# Patient Record
Sex: Female | Born: 1935
Health system: Southern US, Community
[De-identification: ages and names within clinical notes are randomized; demographics above are authoritative.]

## PROBLEM LIST (undated history)

## (undated) DIAGNOSIS — H9190 Unspecified hearing loss, unspecified ear: Secondary | ICD-10-CM

## (undated) DIAGNOSIS — E039 Hypothyroidism, unspecified: Secondary | ICD-10-CM

## (undated) DIAGNOSIS — I639 Cerebral infarction, unspecified: Secondary | ICD-10-CM

## (undated) DIAGNOSIS — E079 Disorder of thyroid, unspecified: Secondary | ICD-10-CM

## (undated) DIAGNOSIS — E7800 Pure hypercholesterolemia, unspecified: Secondary | ICD-10-CM

## (undated) DIAGNOSIS — M1990 Unspecified osteoarthritis, unspecified site: Secondary | ICD-10-CM

## (undated) DIAGNOSIS — C801 Malignant (primary) neoplasm, unspecified: Secondary | ICD-10-CM

## (undated) DIAGNOSIS — K219 Gastro-esophageal reflux disease without esophagitis: Secondary | ICD-10-CM

## (undated) DIAGNOSIS — F419 Anxiety disorder, unspecified: Secondary | ICD-10-CM

## (undated) DIAGNOSIS — I10 Essential (primary) hypertension: Secondary | ICD-10-CM

## (undated) DIAGNOSIS — I4891 Unspecified atrial fibrillation: Secondary | ICD-10-CM

## (undated) HISTORY — PX: APPENDECTOMY: SHX54

## (undated) HISTORY — DX: Unspecified atrial fibrillation: I48.91

## (undated) HISTORY — DX: Disorder of thyroid, unspecified: E07.9

## (undated) HISTORY — DX: Essential (primary) hypertension: I10

## (undated) HISTORY — PX: BIOPSY THYROID: PRO38

## (undated) HISTORY — PX: CHOLECYSTECTOMY: SHX55

## (undated) HISTORY — PX: ABDOMINAL HYSTERECTOMY: SHX81

## (undated) HISTORY — PX: TONSILLECTOMY: SUR1361

---

## 1998-07-20 ENCOUNTER — Ambulatory Visit (HOSPITAL_COMMUNITY): Admission: RE | Admit: 1998-07-20 | Discharge: 1998-07-20 | Payer: Self-pay | Admitting: Obstetrics & Gynecology

## 2001-08-08 ENCOUNTER — Ambulatory Visit (HOSPITAL_COMMUNITY): Admission: RE | Admit: 2001-08-08 | Discharge: 2001-08-08 | Payer: Self-pay | Admitting: Internal Medicine

## 2001-08-08 ENCOUNTER — Encounter (INDEPENDENT_AMBULATORY_CARE_PROVIDER_SITE_OTHER): Payer: Self-pay | Admitting: Internal Medicine

## 2001-10-09 ENCOUNTER — Ambulatory Visit (HOSPITAL_COMMUNITY): Admission: RE | Admit: 2001-10-09 | Discharge: 2001-10-09 | Payer: Self-pay | Admitting: General Surgery

## 2001-10-09 ENCOUNTER — Encounter: Payer: Self-pay | Admitting: General Surgery

## 2002-10-22 ENCOUNTER — Ambulatory Visit (HOSPITAL_COMMUNITY): Admission: RE | Admit: 2002-10-22 | Discharge: 2002-10-22 | Payer: Self-pay | Admitting: Pulmonary Disease

## 2003-06-24 ENCOUNTER — Other Ambulatory Visit: Admission: RE | Admit: 2003-06-24 | Discharge: 2003-06-24 | Payer: Self-pay | Admitting: Dermatology

## 2003-10-28 ENCOUNTER — Ambulatory Visit (HOSPITAL_COMMUNITY): Admission: RE | Admit: 2003-10-28 | Discharge: 2003-10-28 | Payer: Self-pay | Admitting: Pulmonary Disease

## 2004-08-01 ENCOUNTER — Ambulatory Visit (HOSPITAL_COMMUNITY): Admission: RE | Admit: 2004-08-01 | Discharge: 2004-08-01 | Payer: Self-pay | Admitting: Preventative Medicine

## 2004-08-01 ENCOUNTER — Encounter: Payer: Self-pay | Admitting: Orthopedic Surgery

## 2004-11-16 ENCOUNTER — Ambulatory Visit (HOSPITAL_COMMUNITY): Admission: RE | Admit: 2004-11-16 | Discharge: 2004-11-16 | Payer: Self-pay | Admitting: Internal Medicine

## 2005-11-17 ENCOUNTER — Ambulatory Visit (HOSPITAL_COMMUNITY): Admission: RE | Admit: 2005-11-17 | Discharge: 2005-11-17 | Payer: Self-pay | Admitting: Internal Medicine

## 2006-11-20 ENCOUNTER — Ambulatory Visit (HOSPITAL_COMMUNITY): Admission: RE | Admit: 2006-11-20 | Discharge: 2006-11-20 | Payer: Self-pay | Admitting: Internal Medicine

## 2007-09-24 ENCOUNTER — Ambulatory Visit (HOSPITAL_COMMUNITY): Admission: RE | Admit: 2007-09-24 | Discharge: 2007-09-24 | Payer: Self-pay | Admitting: Preventative Medicine

## 2007-09-24 ENCOUNTER — Encounter: Payer: Self-pay | Admitting: Orthopedic Surgery

## 2007-10-16 ENCOUNTER — Ambulatory Visit: Payer: Self-pay | Admitting: Orthopedic Surgery

## 2007-10-16 DIAGNOSIS — M25519 Pain in unspecified shoulder: Secondary | ICD-10-CM | POA: Insufficient documentation

## 2007-10-16 DIAGNOSIS — Z8679 Personal history of other diseases of the circulatory system: Secondary | ICD-10-CM | POA: Insufficient documentation

## 2007-10-31 ENCOUNTER — Ambulatory Visit (HOSPITAL_COMMUNITY): Admission: RE | Admit: 2007-10-31 | Discharge: 2007-10-31 | Payer: Self-pay | Admitting: Internal Medicine

## 2007-12-02 ENCOUNTER — Ambulatory Visit (HOSPITAL_COMMUNITY): Admission: RE | Admit: 2007-12-02 | Discharge: 2007-12-02 | Payer: Self-pay | Admitting: Internal Medicine

## 2008-01-24 ENCOUNTER — Ambulatory Visit (HOSPITAL_COMMUNITY): Admission: RE | Admit: 2008-01-24 | Discharge: 2008-01-24 | Payer: Self-pay | Admitting: Internal Medicine

## 2008-01-24 ENCOUNTER — Encounter: Payer: Self-pay | Admitting: Orthopedic Surgery

## 2008-03-11 ENCOUNTER — Ambulatory Visit: Payer: Self-pay | Admitting: Orthopedic Surgery

## 2008-03-11 DIAGNOSIS — M7580 Other shoulder lesions, unspecified shoulder: Secondary | ICD-10-CM

## 2008-05-20 ENCOUNTER — Ambulatory Visit (HOSPITAL_COMMUNITY): Admission: RE | Admit: 2008-05-20 | Discharge: 2008-05-20 | Payer: Self-pay | Admitting: Internal Medicine

## 2008-12-30 ENCOUNTER — Ambulatory Visit (HOSPITAL_COMMUNITY): Admission: RE | Admit: 2008-12-30 | Discharge: 2008-12-30 | Payer: Self-pay | Admitting: Internal Medicine

## 2009-02-06 ENCOUNTER — Ambulatory Visit (HOSPITAL_COMMUNITY): Admission: RE | Admit: 2009-02-06 | Discharge: 2009-02-06 | Payer: Self-pay | Admitting: Internal Medicine

## 2009-02-06 ENCOUNTER — Encounter: Payer: Self-pay | Admitting: Orthopedic Surgery

## 2009-03-15 ENCOUNTER — Ambulatory Visit: Payer: Self-pay | Admitting: Orthopedic Surgery

## 2009-03-15 DIAGNOSIS — M2240 Chondromalacia patellae, unspecified knee: Secondary | ICD-10-CM

## 2009-03-15 DIAGNOSIS — M25569 Pain in unspecified knee: Secondary | ICD-10-CM

## 2009-08-19 ENCOUNTER — Ambulatory Visit: Payer: Self-pay | Admitting: Orthopedic Surgery

## 2009-10-01 ENCOUNTER — Ambulatory Visit (HOSPITAL_COMMUNITY): Admission: RE | Admit: 2009-10-01 | Discharge: 2009-10-01 | Payer: Self-pay | Admitting: Internal Medicine

## 2009-12-20 ENCOUNTER — Ambulatory Visit: Payer: Self-pay | Admitting: Orthopedic Surgery

## 2010-01-07 ENCOUNTER — Ambulatory Visit (HOSPITAL_COMMUNITY): Admission: RE | Admit: 2010-01-07 | Discharge: 2010-01-07 | Payer: Self-pay | Admitting: Internal Medicine

## 2010-01-31 ENCOUNTER — Ambulatory Visit: Payer: Self-pay | Admitting: Orthopedic Surgery

## 2010-12-15 NOTE — Assessment & Plan Note (Signed)
Summary: 3 M RE-CK LT KNEE/RESP TO MED/MEDICARE/BCBS/CAF   Visit Type:  Follow-up  CC:  recheck knee pain.  History of Present Illness: I saw Sheila Briggs in the office today for a followup visit.  She is a 77 years old woman with the complaint of:  Left knee pain, chondromalcia patella.  Has had injections and takes Aleve two times a day, helps.  Has new problem of rt shoulder pain.    Current Medications (verified): 1)  Pravastatin 2)  Levothyroxene 3)  Clidinium-Chlordiazepoxide 2.5-5 Mg  Caps (Clidinium-Chlordiazepoxide) 4)  Multi Vitamin 5)  Premarin 0.3 Mg  Tabs (Estrogens Conjugated) 6)  Micardis 40 Mg  Tabs (Telmisartan) 7)  Citalopram Hydrobromide 20 Mg  Tabs (Citalopram Hydrobromide)  Allergies (verified): No Known Drug Allergies  Past History:  Past Medical History: Last updated: 10/16/2007 High Blood Pressure thyroid  Past Surgical History: Last updated: 10/16/2007 Tonsillectomy Appendectomy Cholecystectomy hysterectomy skin cancer basil cell  Family History: Last updated: 10/16/2007 Family History Coronary Heart Disease female < 74  Social History: Last updated: 10/16/2007 Patient is widowed.   Risk Factors: Caffeine Use: 0 (10/16/2007)  Risk Factors: Smoking Status: never (10/16/2007)  Physical Exam  Additional Exam:  Normal appearance  Normal pulses  Normal skin  Normal neuro function  Oriented x3  Mood and affect normal  Painful forward elevation of the shoulder positive Neer sign mild weakness rotator cuff full passive range of motion slightly decreased active range of motion RIGHT shoulder   Impression & Recommendations:  Problem # 1:  KNEE PAIN (ZOX-096.04) Assessment Improved  Orders: New Patient Level III (54098) Joint Aspirate / Injection, Large (20610) Depo- Medrol 40mg  (J1030)  Problem # 2:  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 There were no complications  Orders: New Patient Level III (78295) Joint Aspirate / Injection, Large (20610) Depo- Medrol 40mg  (J1030)  Patient Instructions: 1)  You have received an injection of cortisone today. You may experience increased pain at the injection site. Apply ice pack to the area for 20 minutes every 2 hours and take 2 xtra strength tylenol every 8 hours. This increased pain will usually resolve in 24 hours. The injection will take effect in 3-10 days.  2)  Limit activity to comfort and avoid activities that increase discomfort.  Apply moist heat and/or ice to shoulder and take medication as instructed for pain relief. Please read the Shoulder Pain Handout and start exercises  as directed. 3)  return in 6 weeks

## 2010-12-15 NOTE — Assessment & Plan Note (Signed)
Summary: 6 WK RECK RT SHOULDER AFTER INJECTION/MED/BCBS/BSF   Visit Type:  Follow-up  CC:  6 week recheck right shoulder.  History of Present Illness: I saw Sheila Briggs in the office today for a followup visit.  She is a 77 years old woman with the complaint of:  DX: Impingement syndrome  Treatment: injection.  MEDS:  None for this problem.  Complaints: doing alot better, left feels good knee and RIGHT shoulder feels great Today, scheduled for: 6 week recheck right shoulder after injection.  She has full elevation without pain.  She has full flexion and extension of the knee without pain.  Impression improved impingement syndrome and improved LEFT knee pain/chondromalacia patella.  Recommend follow up as needed   Allergies: No Known Drug Allergies   Other Orders: Est. Patient Level II (57846)  Patient Instructions: 1)  Please schedule a follow-up appointment as needed.

## 2011-01-12 ENCOUNTER — Other Ambulatory Visit (HOSPITAL_COMMUNITY): Payer: Self-pay | Admitting: Internal Medicine

## 2011-01-12 DIAGNOSIS — Z139 Encounter for screening, unspecified: Secondary | ICD-10-CM

## 2011-01-19 ENCOUNTER — Ambulatory Visit (HOSPITAL_COMMUNITY)
Admission: RE | Admit: 2011-01-19 | Discharge: 2011-01-19 | Disposition: A | Discharge status: Home / Self Care | Payer: Medicare Other | Source: Ambulatory Visit | Attending: Internal Medicine | Admitting: Internal Medicine

## 2011-01-19 DIAGNOSIS — Z139 Encounter for screening, unspecified: Secondary | ICD-10-CM

## 2011-01-19 DIAGNOSIS — Z1231 Encounter for screening mammogram for malignant neoplasm of breast: Secondary | ICD-10-CM | POA: Insufficient documentation

## 2011-03-21 ENCOUNTER — Ambulatory Visit (INDEPENDENT_AMBULATORY_CARE_PROVIDER_SITE_OTHER): Payer: Medicare Other | Admitting: Orthopedic Surgery

## 2011-03-21 ENCOUNTER — Encounter: Payer: Self-pay | Admitting: Orthopedic Surgery

## 2011-03-21 DIAGNOSIS — I10 Essential (primary) hypertension: Secondary | ICD-10-CM | POA: Insufficient documentation

## 2011-03-21 DIAGNOSIS — M4802 Spinal stenosis, cervical region: Secondary | ICD-10-CM

## 2011-03-21 NOTE — Patient Instructions (Signed)
Referral to Surgery [facility] for cervical spinal stenosis   For now continue aleve

## 2011-03-21 NOTE — Progress Notes (Signed)
Neck stiffness and neck pain.  77 year old female, who had an MRI 4 years ago of the cervical spine, which showed a broad-based disc protrusion with mass effect at C3 and 4 multilevel disc disease, down to C6, and a moderate to large brace disc protrusion, flattening the ventral cord at C5, C6, with neural foraminal stenosis bilaterally.  She presents back complaining of neck stiffness and neck pain with occasional radiation into the RIGHT forearm.  Further questioning reveals pain in the cervical spine, which radiates up into the head area, decreased range of motion is noted on clinical exam, however, she has no weakness, although she has degenerative arthritis of the upper extremities, including the hands.  She has no neurologic deficit at this time.  X-rays show multilevel disc disease with deformity on plain film.  Recommend neurosurgical referral for spinal stenosis.  Note on review of systems the patient denied any gait abnormalities or abnormalities or trouble holding on to objects

## 2011-03-27 ENCOUNTER — Telehealth: Payer: Self-pay | Admitting: Radiology

## 2011-03-27 NOTE — Telephone Encounter (Signed)
I faxed a referral for this patient to [facility] for cervical spinal stenosis.

## 2011-03-30 ENCOUNTER — Telehealth: Payer: Self-pay | Admitting: Radiology

## 2011-03-30 NOTE — Telephone Encounter (Signed)
Patient has an appointment with Dr. Danielle Dess at [facility] on 04-13-11 for her spinal stenosis of her c-spine.

## 2011-05-31 ENCOUNTER — Other Ambulatory Visit (HOSPITAL_COMMUNITY): Payer: Self-pay | Admitting: Neurological Surgery

## 2011-05-31 DIAGNOSIS — M545 Low back pain: Secondary | ICD-10-CM

## 2011-05-31 DIAGNOSIS — M542 Cervicalgia: Secondary | ICD-10-CM

## 2011-06-02 ENCOUNTER — Ambulatory Visit (HOSPITAL_COMMUNITY)
Admission: RE | Admit: 2011-06-02 | Discharge: 2011-06-02 | Disposition: A | Discharge status: Home / Self Care | Payer: Medicare Other | Source: Ambulatory Visit | Attending: Neurological Surgery | Admitting: Neurological Surgery

## 2011-06-02 DIAGNOSIS — M545 Low back pain, unspecified: Secondary | ICD-10-CM

## 2011-06-02 DIAGNOSIS — M542 Cervicalgia: Secondary | ICD-10-CM

## 2011-06-02 DIAGNOSIS — R209 Unspecified disturbances of skin sensation: Secondary | ICD-10-CM | POA: Insufficient documentation

## 2011-06-02 DIAGNOSIS — M5030 Other cervical disc degeneration, unspecified cervical region: Secondary | ICD-10-CM | POA: Insufficient documentation

## 2011-06-02 DIAGNOSIS — M79609 Pain in unspecified limb: Secondary | ICD-10-CM | POA: Insufficient documentation

## 2011-06-02 DIAGNOSIS — M5380 Other specified dorsopathies, site unspecified: Secondary | ICD-10-CM | POA: Insufficient documentation

## 2011-06-13 ENCOUNTER — Other Ambulatory Visit (HOSPITAL_COMMUNITY): Payer: Self-pay | Admitting: Internal Medicine

## 2011-06-13 DIAGNOSIS — E041 Nontoxic single thyroid nodule: Secondary | ICD-10-CM

## 2011-06-15 ENCOUNTER — Ambulatory Visit (HOSPITAL_COMMUNITY)
Admission: RE | Admit: 2011-06-15 | Discharge: 2011-06-15 | Disposition: A | Discharge status: Home / Self Care | Payer: Medicare Other | Source: Ambulatory Visit | Attending: Internal Medicine | Admitting: Internal Medicine

## 2011-06-15 DIAGNOSIS — E042 Nontoxic multinodular goiter: Secondary | ICD-10-CM | POA: Insufficient documentation

## 2011-06-15 DIAGNOSIS — E041 Nontoxic single thyroid nodule: Secondary | ICD-10-CM

## 2011-06-16 ENCOUNTER — Other Ambulatory Visit (HOSPITAL_COMMUNITY): Payer: Self-pay | Admitting: Internal Medicine

## 2011-06-16 DIAGNOSIS — E041 Nontoxic single thyroid nodule: Secondary | ICD-10-CM

## 2011-06-16 DIAGNOSIS — E042 Nontoxic multinodular goiter: Secondary | ICD-10-CM

## 2011-06-26 ENCOUNTER — Ambulatory Visit (HOSPITAL_COMMUNITY)
Admission: RE | Admit: 2011-06-26 | Discharge: 2011-06-26 | Disposition: A | Discharge status: Home / Self Care | Payer: Medicare Other | Source: Ambulatory Visit | Attending: Internal Medicine | Admitting: Internal Medicine

## 2011-06-26 ENCOUNTER — Ambulatory Visit (HOSPITAL_COMMUNITY)
Admission: RE | Admit: 2011-06-26 | Discharge: 2011-06-26 | Payer: Medicare Other | Source: Ambulatory Visit | Attending: Internal Medicine | Admitting: Internal Medicine

## 2011-06-26 ENCOUNTER — Other Ambulatory Visit (HOSPITAL_COMMUNITY): Payer: Self-pay | Admitting: Internal Medicine

## 2011-06-26 DIAGNOSIS — E041 Nontoxic single thyroid nodule: Secondary | ICD-10-CM

## 2011-06-26 DIAGNOSIS — E0789 Other specified disorders of thyroid: Secondary | ICD-10-CM | POA: Insufficient documentation

## 2011-06-26 NOTE — Procedures (Signed)
PreOperative Dx: Bilateral thyroid nodules  Postoperative Dx: Bilateral thyroid nodules Procedure:   Bilateral ultrasound guided thyroid nodule FNA Radiologist:  Tyron Russell Anesthesia:  Local 3 ml 2% xylocaine Specimen:  Bilateral thyroid FNA x 3 of each nodule EBL:   Minimal Complications: None Procedure tolerated very well by patient

## 2011-06-26 NOTE — Progress Notes (Signed)
Procedure started at 1048. Injected with 3 cc Lidocaine.  Procedure complete at 1109.  Biopsies obtained from right inferior and left mid.  Tolerated procedure well.

## 2011-07-06 NOTE — Progress Notes (Signed)
Reviewed cytology results of bilateral thyroid nodule FNA.

## 2011-11-15 DIAGNOSIS — L02419 Cutaneous abscess of limb, unspecified: Secondary | ICD-10-CM | POA: Diagnosis not present

## 2011-11-23 DIAGNOSIS — L03119 Cellulitis of unspecified part of limb: Secondary | ICD-10-CM | POA: Diagnosis not present

## 2011-11-27 DIAGNOSIS — S81809A Unspecified open wound, unspecified lower leg, initial encounter: Secondary | ICD-10-CM | POA: Diagnosis not present

## 2011-12-18 DIAGNOSIS — Z85828 Personal history of other malignant neoplasm of skin: Secondary | ICD-10-CM | POA: Diagnosis not present

## 2011-12-18 DIAGNOSIS — L821 Other seborrheic keratosis: Secondary | ICD-10-CM | POA: Diagnosis not present

## 2011-12-18 DIAGNOSIS — L570 Actinic keratosis: Secondary | ICD-10-CM | POA: Diagnosis not present

## 2012-01-01 DIAGNOSIS — H5200 Hypermetropia, unspecified eye: Secondary | ICD-10-CM | POA: Diagnosis not present

## 2012-01-01 DIAGNOSIS — H01009 Unspecified blepharitis unspecified eye, unspecified eyelid: Secondary | ICD-10-CM | POA: Diagnosis not present

## 2012-01-01 DIAGNOSIS — H2589 Other age-related cataract: Secondary | ICD-10-CM | POA: Diagnosis not present

## 2012-01-01 DIAGNOSIS — H04129 Dry eye syndrome of unspecified lacrimal gland: Secondary | ICD-10-CM | POA: Diagnosis not present

## 2012-01-15 ENCOUNTER — Other Ambulatory Visit (HOSPITAL_COMMUNITY): Payer: Self-pay | Admitting: Internal Medicine

## 2012-01-15 DIAGNOSIS — Z139 Encounter for screening, unspecified: Secondary | ICD-10-CM

## 2012-01-23 ENCOUNTER — Ambulatory Visit (HOSPITAL_COMMUNITY)
Admission: RE | Admit: 2012-01-23 | Discharge: 2012-01-23 | Disposition: A | Discharge status: Home / Self Care | Payer: Medicare Other | Source: Ambulatory Visit | Attending: Internal Medicine | Admitting: Internal Medicine

## 2012-01-23 DIAGNOSIS — Z139 Encounter for screening, unspecified: Secondary | ICD-10-CM

## 2012-01-23 DIAGNOSIS — Z1231 Encounter for screening mammogram for malignant neoplasm of breast: Secondary | ICD-10-CM | POA: Diagnosis not present

## 2012-01-23 DIAGNOSIS — R928 Other abnormal and inconclusive findings on diagnostic imaging of breast: Secondary | ICD-10-CM | POA: Diagnosis not present

## 2012-01-29 ENCOUNTER — Other Ambulatory Visit: Payer: Self-pay | Admitting: Internal Medicine

## 2012-01-29 DIAGNOSIS — R928 Other abnormal and inconclusive findings on diagnostic imaging of breast: Secondary | ICD-10-CM

## 2012-02-14 ENCOUNTER — Ambulatory Visit (HOSPITAL_COMMUNITY)
Admission: RE | Admit: 2012-02-14 | Discharge: 2012-02-14 | Disposition: A | Discharge status: Home / Self Care | Payer: Medicare Other | Source: Ambulatory Visit | Attending: Internal Medicine | Admitting: Internal Medicine

## 2012-02-14 ENCOUNTER — Other Ambulatory Visit: Payer: Self-pay | Admitting: Internal Medicine

## 2012-02-14 DIAGNOSIS — R928 Other abnormal and inconclusive findings on diagnostic imaging of breast: Secondary | ICD-10-CM | POA: Diagnosis not present

## 2012-02-15 DIAGNOSIS — I10 Essential (primary) hypertension: Secondary | ICD-10-CM | POA: Diagnosis not present

## 2012-02-15 DIAGNOSIS — Z79890 Hormone replacement therapy (postmenopausal): Secondary | ICD-10-CM | POA: Diagnosis not present

## 2012-05-09 DIAGNOSIS — L570 Actinic keratosis: Secondary | ICD-10-CM | POA: Diagnosis not present

## 2012-05-09 DIAGNOSIS — D235 Other benign neoplasm of skin of trunk: Secondary | ICD-10-CM | POA: Diagnosis not present

## 2012-05-09 DIAGNOSIS — Z85828 Personal history of other malignant neoplasm of skin: Secondary | ICD-10-CM | POA: Diagnosis not present

## 2012-06-08 DIAGNOSIS — E039 Hypothyroidism, unspecified: Secondary | ICD-10-CM | POA: Diagnosis not present

## 2012-06-08 DIAGNOSIS — I10 Essential (primary) hypertension: Secondary | ICD-10-CM | POA: Diagnosis not present

## 2012-06-08 DIAGNOSIS — E785 Hyperlipidemia, unspecified: Secondary | ICD-10-CM | POA: Diagnosis not present

## 2012-06-08 DIAGNOSIS — Z79899 Other long term (current) drug therapy: Secondary | ICD-10-CM | POA: Diagnosis not present

## 2012-06-10 ENCOUNTER — Ambulatory Visit (INDEPENDENT_AMBULATORY_CARE_PROVIDER_SITE_OTHER): Payer: Medicare Other | Admitting: Internal Medicine

## 2012-06-10 ENCOUNTER — Encounter (INDEPENDENT_AMBULATORY_CARE_PROVIDER_SITE_OTHER): Payer: Self-pay | Admitting: Internal Medicine

## 2012-06-10 VITALS — BP 128/80 | HR 80 | Temp 98.0°F | Ht 62.0 in | Wt 121.8 lb

## 2012-06-10 DIAGNOSIS — K219 Gastro-esophageal reflux disease without esophagitis: Secondary | ICD-10-CM | POA: Diagnosis not present

## 2012-06-10 DIAGNOSIS — E039 Hypothyroidism, unspecified: Secondary | ICD-10-CM | POA: Insufficient documentation

## 2012-06-10 NOTE — Patient Instructions (Addendum)
Dexilant samples. HOB up. PR in 2 weeks.

## 2012-06-10 NOTE — Progress Notes (Signed)
Subjective:     Patient ID: Sheila Briggs, female   DOB: 09/25/1932, 78 y.o.   MRN: [id number]  HPINancy is a 77 yr old female with today with c/o that she has indigestion and loud burps. This does not occur every day.  This has been occurring for 8-9 months. She has been on Omeprazole for this and has help some. This occurs every 3-4 days or may last 2-3 days. Appetite is good. No weight loss.  No abdominal pain. She does have a burning sensation after eating. She has trouble at night lying down. She has a BM x 1 a day. No melena or bright red rectal bleeding.   Review of Systems see hpi Current Outpatient Prescriptions  Medication Sig Dispense Refill  . amLODipine (NORVASC) 2.5 MG tablet       . citalopram (CELEXA) 10 MG tablet Take 10 mg by mouth daily.      Marland Kitchen estradiol (ESTRACE) 0.5 MG tablet       . hydrochlorothiazide (HYDRODIURIL) 12.5 MG tablet       . omeprazole (PRILOSEC) 20 MG capsule       . SYNTHROID 25 MCG tablet       . naproxen sodium (ANAPROX) 220 MG tablet Take 220 mg by mouth as needed.         Current Outpatient Prescriptions on File Prior to Visit  Medication Sig Dispense Refill  . amLODipine (NORVASC) 2.5 MG tablet       . citalopram (CELEXA) 10 MG tablet Take 10 mg by mouth daily.      Marland Kitchen estradiol (ESTRACE) 0.5 MG tablet       . hydrochlorothiazide (HYDRODIURIL) 12.5 MG tablet       . omeprazole (PRILOSEC) 20 MG capsule       . SYNTHROID 25 MCG tablet       . naproxen sodium (ANAPROX) 220 MG tablet Take 220 mg by mouth as needed.         Past Medical History  Diagnosis Date  . Thyroid disease   . Hypertension    Past Surgical History  Procedure Date  . Biopsy thyroid     benign   Family Status  Relation Status Death Age  . Mother Deceased     alzheimer's  . Father Deceased     MI  . Brother Deceased     MI   History   Social History  . Marital Status: Widowed    Spouse Name: N/A    Number of Children: N/A  . Years of Education: N/A    Occupational History  . Not on file.   Social History Main Topics  . Smoking status: Never Smoker   . Smokeless tobacco: Not on file  . Alcohol Use: No  . Drug Use:   . Sexually Active:    Other Topics Concern  . Not on file   Social History Narrative  . No narrative on file   No Known Allergies     Objective:   Physical Exam Filed Vitals:   06/10/12 1429  Height: 5\' 2"  (1.575 m)  Weight: 121 lb 12.8 oz (55.248 kg)  Alert and oriented. Skin warm and dry. Oral mucosa is moist.   . Sclera anicteric, conjunctivae is pink. Thyroid not enlarged. No cervical lymphadenopathy. Lungs clear. Heart regular rate and rhythm.  Abdomen is soft. Bowel sounds are positive. No hepatomegaly. No abdominal masses felt. No tenderness.  No edema to lower extremities.  Marland Kitchen  Assessment:    GERD. Not controlled at this time.    Plan:    Dexilant samples today. HOB up. GERD diet reviewed with patient.

## 2012-06-17 ENCOUNTER — Other Ambulatory Visit (HOSPITAL_COMMUNITY): Payer: Self-pay | Admitting: Internal Medicine

## 2012-06-17 DIAGNOSIS — I10 Essential (primary) hypertension: Secondary | ICD-10-CM | POA: Diagnosis not present

## 2012-06-17 DIAGNOSIS — E785 Hyperlipidemia, unspecified: Secondary | ICD-10-CM | POA: Diagnosis not present

## 2012-06-17 DIAGNOSIS — E039 Hypothyroidism, unspecified: Secondary | ICD-10-CM | POA: Diagnosis not present

## 2012-06-17 DIAGNOSIS — R0989 Other specified symptoms and signs involving the circulatory and respiratory systems: Secondary | ICD-10-CM

## 2012-06-17 DIAGNOSIS — I4949 Other premature depolarization: Secondary | ICD-10-CM | POA: Diagnosis not present

## 2012-06-20 ENCOUNTER — Ambulatory Visit (HOSPITAL_COMMUNITY)
Admission: RE | Admit: 2012-06-20 | Discharge: 2012-06-20 | Disposition: A | Discharge status: Home / Self Care | Payer: Medicare Other | Source: Ambulatory Visit | Attending: Internal Medicine | Admitting: Internal Medicine

## 2012-06-20 ENCOUNTER — Other Ambulatory Visit (HOSPITAL_COMMUNITY): Payer: Self-pay | Admitting: Internal Medicine

## 2012-06-20 DIAGNOSIS — I6529 Occlusion and stenosis of unspecified carotid artery: Secondary | ICD-10-CM | POA: Diagnosis not present

## 2012-06-20 DIAGNOSIS — R0989 Other specified symptoms and signs involving the circulatory and respiratory systems: Secondary | ICD-10-CM

## 2012-06-20 DIAGNOSIS — I658 Occlusion and stenosis of other precerebral arteries: Secondary | ICD-10-CM | POA: Diagnosis not present

## 2012-06-24 ENCOUNTER — Telehealth (INDEPENDENT_AMBULATORY_CARE_PROVIDER_SITE_OTHER): Payer: Self-pay | Admitting: Internal Medicine

## 2012-06-25 ENCOUNTER — Telehealth (INDEPENDENT_AMBULATORY_CARE_PROVIDER_SITE_OTHER): Payer: Self-pay | Admitting: Internal Medicine

## 2012-06-25 DIAGNOSIS — K219 Gastro-esophageal reflux disease without esophagitis: Secondary | ICD-10-CM

## 2012-06-25 MED ORDER — OMEPRAZOLE 40 MG PO CPDR: 40.0000 mg | DELAYED_RELEASE_CAPSULE | Freq: Every day | ORAL | Status: DC

## 2012-06-25 NOTE — Telephone Encounter (Signed)
Rx for omperazole e-prescribed to her pharmacy.

## 2012-06-25 NOTE — Telephone Encounter (Signed)
Needs Rx for Omeprazole

## 2012-07-17 ENCOUNTER — Other Ambulatory Visit (HOSPITAL_COMMUNITY): Payer: Self-pay | Admitting: Internal Medicine

## 2012-07-17 DIAGNOSIS — N6320 Unspecified lump in the left breast, unspecified quadrant: Secondary | ICD-10-CM

## 2012-08-16 DIAGNOSIS — Z23 Encounter for immunization: Secondary | ICD-10-CM | POA: Diagnosis not present

## 2012-08-21 ENCOUNTER — Ambulatory Visit (HOSPITAL_COMMUNITY)
Admission: RE | Admit: 2012-08-21 | Discharge: 2012-08-21 | Disposition: A | Discharge status: Home / Self Care | Payer: Medicare Other | Source: Ambulatory Visit | Attending: Internal Medicine | Admitting: Internal Medicine

## 2012-08-21 ENCOUNTER — Other Ambulatory Visit (HOSPITAL_COMMUNITY): Payer: Self-pay | Admitting: Internal Medicine

## 2012-08-21 DIAGNOSIS — N6320 Unspecified lump in the left breast, unspecified quadrant: Secondary | ICD-10-CM

## 2012-08-21 DIAGNOSIS — N63 Unspecified lump in unspecified breast: Secondary | ICD-10-CM | POA: Insufficient documentation

## 2012-08-21 DIAGNOSIS — R928 Other abnormal and inconclusive findings on diagnostic imaging of breast: Secondary | ICD-10-CM | POA: Diagnosis not present

## 2012-09-02 ENCOUNTER — Encounter (INDEPENDENT_AMBULATORY_CARE_PROVIDER_SITE_OTHER): Payer: Self-pay

## 2012-10-02 DIAGNOSIS — H2589 Other age-related cataract: Secondary | ICD-10-CM | POA: Diagnosis not present

## 2012-10-16 DIAGNOSIS — E785 Hyperlipidemia, unspecified: Secondary | ICD-10-CM | POA: Diagnosis not present

## 2012-10-16 DIAGNOSIS — Z79899 Other long term (current) drug therapy: Secondary | ICD-10-CM | POA: Diagnosis not present

## 2012-10-24 DIAGNOSIS — E785 Hyperlipidemia, unspecified: Secondary | ICD-10-CM | POA: Diagnosis not present

## 2012-10-24 DIAGNOSIS — I10 Essential (primary) hypertension: Secondary | ICD-10-CM | POA: Diagnosis not present

## 2012-11-18 DIAGNOSIS — H2589 Other age-related cataract: Secondary | ICD-10-CM | POA: Diagnosis not present

## 2012-11-18 DIAGNOSIS — H40019 Open angle with borderline findings, low risk, unspecified eye: Secondary | ICD-10-CM | POA: Diagnosis not present

## 2012-11-18 DIAGNOSIS — H35039 Hypertensive retinopathy, unspecified eye: Secondary | ICD-10-CM | POA: Diagnosis not present

## 2012-11-18 DIAGNOSIS — H04129 Dry eye syndrome of unspecified lacrimal gland: Secondary | ICD-10-CM | POA: Diagnosis not present

## 2012-12-05 ENCOUNTER — Encounter (HOSPITAL_COMMUNITY): Payer: Self-pay | Admitting: Pharmacy Technician

## 2012-12-11 NOTE — Patient Instructions (Addendum)
Your procedure is scheduled on:  12/19/2012  Report to [facility] at    6:40  AM.  Call this number if you have problems the morning of surgery: [phone]   Remember:   Do not eat or drink :After Midnight.    Take these medicines the morning of surgery with A SIP OF WATER: AMLODIPINE, OMEPRAZOLE, SYNTHROID   Do not wear jewelry, make-up or nail polish.  Do not wear lotions, powders, or perfumes. You may wear deodorant.  Do not shave 48 hours prior to surgery.  Do not bring valuables to the hospital.  Contacts, dentures or bridgework may not be worn into surgery.  Patients discharged the day of surgery will not be allowed to drive home.  Name and phone number of your driver:    Please read over the following fact sheets that you were given: Pain Booklet, Surgical Site Infection Prevention, Anesthesia Post-op Instructions and Care and Recovery After Surgery  Cataract Surgery  A cataract is a clouding of the lens of the eye. When a lens becomes cloudy, vision is reduced based on the degree and nature of the clouding. Surgery may be needed to improve vision. Surgery removes the cloudy lens and usually replaces it with a substitute lens (intraocular lens, IOL). LET YOUR EYE DOCTOR KNOW ABOUT:  Allergies to food or medicine.   Medicines taken including herbs, eyedrops, over-the-counter medicines, and creams.   Use of steroids (by mouth or creams).   Previous problems with anesthetics or numbing medicine.   History of bleeding problems or blood clots.   Previous surgery.   Other health problems, including diabetes and kidney problems.   Possibility of pregnancy, if this applies.  RISKS AND COMPLICATIONS  Infection.   Inflammation of the eyeball (endophthalmitis) that can spread to both eyes (sympathetic ophthalmia).   Poor wound healing.   If an IOL is inserted, it can later fall out of proper position. This is very uncommon.   Clouding of the part of your eye that holds an  IOL in place. This is called an "after-cataract." These are uncommon, but easily treated.  BEFORE THE PROCEDURE  Do not eat or drink anything except small amounts of water for 8 to 12 before your surgery, or as directed by your caregiver.   Unless you are told otherwise, continue any eyedrops you have been prescribed.   Talk to your primary caregiver about all other medicines that you take (both prescription and non-prescription). In some cases, you may need to stop or change medicines near the time of your surgery. This is most important if you are taking blood-thinning medicine.Do not stop medicines unless you are told to do so.   Arrange for someone to drive you to and from the procedure.   Do not put contact lenses in either eye on the day of your surgery.  PROCEDURE There is more than one method for safely removing a cataract. Your doctor can explain the differences and help determine which is best for you. Phacoemulsification surgery is the most common form of cataract surgery.  An injection is given behind the eye or eyedrops are given to make this a painless procedure.   A small cut (incision) is made on the edge of the clear, dome-shaped surface that covers the front of the eye (cornea).   A tiny probe is painlessly inserted into the eye. This device gives off ultrasound waves that soften and break up the cloudy center of the lens. This makes it  easier for the cloudy lens to be removed by suction.   An IOL may be implanted.   The normal lens of the eye is covered by a clear capsule. Part of that capsule is intentionally left in the eye to support the IOL.   Your surgeon may or may not use stitches to close the incision.  There are other forms of cataract surgery that require a larger incision and stiches to close the eye. This approach is taken in cases where the doctor feels that the cataract cannot be easily removed using phacoemulsification. AFTER THE PROCEDURE  When an  IOL is implanted, it does not need care. It becomes a permanent part of your eye and cannot be seen or felt.   Your doctor will schedule follow-up exams to check on your progress.   Review your other medicines with your doctor to see which can be resumed after surgery.   Use eyedrops or take medicine as prescribed by your doctor.  Document Released: 10/19/2011 Document Reviewed: 10/16/2011 [facility] Patient Information 2012 [place].  .Cataract Surgery Care After Refer to this sheet in the next few weeks. These instructions provide you with information on caring for yourself after your procedure. Your caregiver may also give you more specific instructions. Your treatment has been planned according to current medical practices, but problems sometimes occur. Call your caregiver if you have any problems or questions after your procedure.  HOME CARE INSTRUCTIONS   Avoid strenuous activities as directed by your caregiver.   Ask your caregiver when you can resume driving.   Use eyedrops or other medicines to help healing and control pressure inside your eye as directed by your caregiver.   Only take over-the-counter or prescription medicines for pain, discomfort, or fever as directed by your caregiver.   Do not to touch or rub your eyes.   You may be instructed to use a protective shield during the first few days and nights after surgery. If not, wear sunglasses to protect your eyes. This is to protect the eye from pressure or from being accidentally bumped.   Keep the area around your eye clean and dry. Avoid swimming or allowing water to hit you directly in the face while showering. Keep soap and shampoo out of your eyes.   Do not bend or lift heavy objects. Bending increases pressure in the eye. You can walk, climb stairs, and do light household chores.   Do not put a contact lens into the eye that had surgery until your caregiver says it is okay to do so.   Ask your doctor when  you can return to work. This will depend on the kind of work that you do. If you work in a dusty environment, you may be advised to wear protective eyewear for a period of time.   Ask your caregiver when it will be safe to engage in sexual activity.   Continue with your regular eye exams as directed by your caregiver.  What to expect:  It is normal to feel itching and mild discomfort for a few days after cataract surgery. Some fluid discharge is also common, and your eye may be sensitive to light and touch.   After 1 to 2 days, even moderate discomfort should disappear. In most cases, healing will take about 6 weeks.   If you received an intraocular lens (IOL), you may notice that colors are very bright or have a blue tinge. Also, if you have been in bright sunlight, everything  may appear reddish for a few hours. If you see these color tinges, it is because your lens is clear and no longer cloudy. Within a few months after receiving an IOL, these extra colors should go away. When you have healed, you will probably need new glasses.  SEEK MEDICAL CARE IF:   You have increased bruising around your eye.   You have discomfort not helped by medicine.  SEEK IMMEDIATE MEDICAL CARE IF:   You have a fever.   You have a worsening or sudden vision loss.   You have redness, swelling, or increasing pain in the eye.   You have a thick discharge from the eye that had surgery.  MAKE SURE YOU:  Understand these instructions.   Will watch your condition.   Will get help right away if you are not doing well or get worse.  Document Released: 05/19/2005 Document Revised: 10/19/2011 Document Reviewed: 06/23/2011 [facility] Patient Information 2012 [place], [place].

## 2012-12-12 ENCOUNTER — Other Ambulatory Visit: Payer: Self-pay

## 2012-12-12 ENCOUNTER — Encounter (HOSPITAL_COMMUNITY)
Admission: RE | Admit: 2012-12-12 | Discharge: 2012-12-12 | Disposition: A | Discharge status: Home / Self Care | Payer: Medicare Other | Source: Ambulatory Visit | Attending: Ophthalmology | Admitting: Ophthalmology

## 2012-12-12 ENCOUNTER — Encounter (HOSPITAL_COMMUNITY): Payer: Self-pay

## 2012-12-12 DIAGNOSIS — Z01812 Encounter for preprocedural laboratory examination: Secondary | ICD-10-CM | POA: Diagnosis not present

## 2012-12-12 DIAGNOSIS — H2589 Other age-related cataract: Secondary | ICD-10-CM | POA: Diagnosis not present

## 2012-12-12 DIAGNOSIS — I10 Essential (primary) hypertension: Secondary | ICD-10-CM | POA: Diagnosis not present

## 2012-12-12 DIAGNOSIS — Z01810 Encounter for preprocedural cardiovascular examination: Secondary | ICD-10-CM | POA: Diagnosis not present

## 2012-12-12 HISTORY — DX: Malignant (primary) neoplasm, unspecified: C80.1

## 2012-12-12 HISTORY — DX: Unspecified hearing loss, unspecified ear: H91.90

## 2012-12-12 HISTORY — DX: Pure hypercholesterolemia, unspecified: E78.00

## 2012-12-12 HISTORY — DX: Anxiety disorder, unspecified: F41.9

## 2012-12-12 HISTORY — DX: Hypothyroidism, unspecified: E03.9

## 2012-12-12 HISTORY — DX: Gastro-esophageal reflux disease without esophagitis: K21.9

## 2012-12-12 HISTORY — DX: Unspecified osteoarthritis, unspecified site: M19.90

## 2012-12-12 LAB — BASIC METABOLIC PANEL
BUN: 13 mg/dL (ref 6–23)
CO2: 34 mEq/L — ABNORMAL HIGH (ref 19–32)
Calcium: 9.5 mg/dL (ref 8.4–10.5)
Creatinine, Ser: 0.62 mg/dL (ref 0.50–1.10)
GFR calc non Af Amer: 84 mL/min — ABNORMAL LOW (ref >90)
Glucose, Bld: 73 mg/dL (ref 70–99)

## 2012-12-12 LAB — HEMOGLOBIN AND HEMATOCRIT, BLOOD
HCT: 42.8 % (ref 36.0–46.0)
Hemoglobin: 14.1 g/dL (ref 12.0–15.0)

## 2012-12-18 MED ORDER — PHENYLEPHRINE HCL 2.5 % OP SOLN
OPHTHALMIC | Status: AC
  Filled 2012-12-18: qty 2

## 2012-12-18 MED ORDER — CYCLOPENTOLATE-PHENYLEPHRINE 0.2-1 % OP SOLN
OPHTHALMIC | Status: AC
  Filled 2012-12-18: qty 2

## 2012-12-18 MED ORDER — LIDOCAINE HCL 3.5 % OP GEL
OPHTHALMIC | Status: AC
  Filled 2012-12-18: qty 5

## 2012-12-18 MED ORDER — TETRACAINE HCL 0.5 % OP SOLN
OPHTHALMIC | Status: AC
  Filled 2012-12-18: qty 2

## 2012-12-18 MED ORDER — LIDOCAINE HCL (PF) 1 % IJ SOLN
INTRAMUSCULAR | Status: AC
  Filled 2012-12-18: qty 2

## 2012-12-18 MED ORDER — NEOMYCIN-POLYMYXIN-DEXAMETH 3.5-10000-0.1 OP OINT
TOPICAL_OINTMENT | OPHTHALMIC | Status: AC
  Filled 2012-12-18: qty 3.5

## 2012-12-19 ENCOUNTER — Encounter (HOSPITAL_COMMUNITY)
Admission: RE | Disposition: A | Discharge status: Home / Self Care | Payer: Self-pay | Source: Ambulatory Visit | Attending: Ophthalmology

## 2012-12-19 ENCOUNTER — Ambulatory Visit (HOSPITAL_COMMUNITY): Payer: Medicare Other | Admitting: Anesthesiology

## 2012-12-19 ENCOUNTER — Encounter (HOSPITAL_COMMUNITY): Payer: Self-pay | Admitting: Anesthesiology

## 2012-12-19 ENCOUNTER — Ambulatory Visit (HOSPITAL_COMMUNITY)
Admission: RE | Admit: 2012-12-19 | Discharge: 2012-12-19 | Disposition: A | Discharge status: Home / Self Care | Payer: Medicare Other | Source: Ambulatory Visit | Attending: Ophthalmology | Admitting: Ophthalmology

## 2012-12-19 ENCOUNTER — Encounter (HOSPITAL_COMMUNITY): Payer: Self-pay | Admitting: *Deleted

## 2012-12-19 DIAGNOSIS — Z01812 Encounter for preprocedural laboratory examination: Secondary | ICD-10-CM | POA: Insufficient documentation

## 2012-12-19 DIAGNOSIS — Z01810 Encounter for preprocedural cardiovascular examination: Secondary | ICD-10-CM | POA: Insufficient documentation

## 2012-12-19 DIAGNOSIS — I10 Essential (primary) hypertension: Secondary | ICD-10-CM | POA: Insufficient documentation

## 2012-12-19 DIAGNOSIS — H2589 Other age-related cataract: Secondary | ICD-10-CM | POA: Diagnosis not present

## 2012-12-19 DIAGNOSIS — H2510 Age-related nuclear cataract, unspecified eye: Secondary | ICD-10-CM | POA: Diagnosis not present

## 2012-12-19 DIAGNOSIS — H269 Unspecified cataract: Secondary | ICD-10-CM | POA: Diagnosis not present

## 2012-12-19 HISTORY — PX: CATARACT EXTRACTION W/PHACO: SHX586

## 2012-12-19 SURGERY — PHACOEMULSIFICATION, CATARACT, WITH IOL INSERTION
Anesthesia: Monitor Anesthesia Care | Site: Eye | Laterality: Left | Wound class: Clean

## 2012-12-19 MED ORDER — LACTATED RINGERS IV SOLN
INTRAVENOUS | Status: DC | PRN
  Administered 2012-12-19: 07:00:00 via INTRAVENOUS

## 2012-12-19 MED ORDER — EPINEPHRINE HCL 1 MG/ML IJ SOLN
INTRAOCULAR | Status: DC | PRN
  Administered 2012-12-19: 08:00:00

## 2012-12-19 MED ORDER — LACTATED RINGERS IV SOLN
INTRAVENOUS | Status: DC
  Administered 2012-12-19: 08:00:00 via INTRAVENOUS

## 2012-12-19 MED ORDER — PROVISC 10 MG/ML IO SOLN
INTRAOCULAR | Status: DC | PRN
  Administered 2012-12-19: 8.5 mg via INTRAOCULAR

## 2012-12-19 MED ORDER — NEOMYCIN-POLYMYXIN-DEXAMETH 0.1 % OP OINT
TOPICAL_OINTMENT | OPHTHALMIC | Status: DC | PRN
  Administered 2012-12-19: 1 via OPHTHALMIC

## 2012-12-19 MED ORDER — PHENYLEPHRINE HCL 2.5 % OP SOLN
1.0000 [drp] | OPHTHALMIC | Status: AC
  Administered 2012-12-19 (×3): 1 [drp] via OPHTHALMIC

## 2012-12-19 MED ORDER — BSS IO SOLN
INTRAOCULAR | Status: DC | PRN
  Administered 2012-12-19: 15 mL via INTRAOCULAR

## 2012-12-19 MED ORDER — MIDAZOLAM HCL 2 MG/2ML IJ SOLN
INTRAMUSCULAR | Status: AC
  Filled 2012-12-19: qty 2

## 2012-12-19 MED ORDER — CYCLOPENTOLATE-PHENYLEPHRINE 0.2-1 % OP SOLN
1.0000 [drp] | OPHTHALMIC | Status: AC
  Administered 2012-12-19 (×3): 1 [drp] via OPHTHALMIC

## 2012-12-19 MED ORDER — LIDOCAINE HCL (PF) 1 % IJ SOLN
INTRAMUSCULAR | Status: AC
  Filled 2012-12-19: qty 2

## 2012-12-19 MED ORDER — TETRACAINE HCL 0.5 % OP SOLN
1.0000 [drp] | OPHTHALMIC | Status: AC
  Administered 2012-12-19 (×3): 1 [drp] via OPHTHALMIC

## 2012-12-19 MED ORDER — LIDOCAINE HCL (PF) 1 % IJ SOLN
INTRAMUSCULAR | Status: DC | PRN
  Administered 2012-12-19: .3 mL

## 2012-12-19 MED ORDER — METHYLENE BLUE 1 % INJ SOLN
INTRAMUSCULAR | Status: AC
  Filled 2012-12-19: qty 1

## 2012-12-19 MED ORDER — MIDAZOLAM HCL 2 MG/2ML IJ SOLN
1.0000 mg | INTRAMUSCULAR | Status: DC | PRN
  Administered 2012-12-19: 2 mg via INTRAVENOUS

## 2012-12-19 MED ORDER — LIDOCAINE HCL 3.5 % OP GEL
1.0000 "application " | Freq: Once | OPHTHALMIC | Status: AC
  Administered 2012-12-19: 1 via OPHTHALMIC

## 2012-12-19 MED ORDER — FENTANYL CITRATE 0.05 MG/ML IJ SOLN: 25.0000 ug | INTRAMUSCULAR | Status: DC | PRN

## 2012-12-19 MED ORDER — POVIDONE-IODINE 5 % OP SOLN
OPHTHALMIC | Status: DC | PRN
  Administered 2012-12-19: 1 via OPHTHALMIC

## 2012-12-19 MED ORDER — ONDANSETRON HCL 4 MG/2ML IJ SOLN: 4.0000 mg | Freq: Once | INTRAMUSCULAR | Status: AC | PRN

## 2012-12-19 MED ORDER — EPINEPHRINE HCL 1 MG/ML IJ SOLN
INTRAMUSCULAR | Status: AC
  Filled 2012-12-19: qty 1

## 2012-12-19 SURGICAL SUPPLY — 32 items

## 2012-12-19 NOTE — Anesthesia Procedure Notes (Signed)
Procedure Name: MAC Date/Time: 12/19/2012 7:44 AM Performed by: Carolyne Littles, AMY L Pre-anesthesia Checklist: Patient identified, Patient being monitored, Emergency Drugs available, Timeout performed and Suction available Oxygen Delivery Method: Nasal cannula

## 2012-12-19 NOTE — H&P (Signed)
I have reviewed the H&P, the patient was re-examined, and I have identified no interval changes in medical condition and plan of care since the history and physical of record  

## 2012-12-19 NOTE — Brief Op Note (Signed)
Pre-Op Dx: Cataract OS Post-Op Dx: Cataract OS Surgeon: Troye Hiemstra Anesthesia: Topical with MAC Surgery: Cataract Extraction with Intraocular lens Implant OS Implant: B&L enVista Specimen: None Complications: None 

## 2012-12-19 NOTE — Anesthesia Preprocedure Evaluation (Signed)
Anesthesia Evaluation  Patient identified by MRN, date of birth, ID band Patient awake    Reviewed: Allergy & Precautions, H&P , NPO status , Patient's Chart, lab work & pertinent test results  Airway Mallampati: II TM Distance: <3 FB     Dental  (+) Teeth Intact   Pulmonary  breath sounds clear to auscultation        Cardiovascular hypertension, Pt. on medications Rhythm:Regular Rate:Normal     Neuro/Psych PSYCHIATRIC DISORDERS Anxiety    GI/Hepatic GERD-  ,  Endo/Other  Hypothyroidism   Renal/GU      Musculoskeletal   Abdominal   Peds  Hematology   Anesthesia Other Findings   Reproductive/Obstetrics                           Anesthesia Physical Anesthesia Plan  ASA: II  Anesthesia Plan: MAC   Post-op Pain Management:    Induction: Intravenous  Airway Management Planned: Nasal Cannula  Additional Equipment:   Intra-op Plan:   Post-operative Plan:   Informed Consent: I have reviewed the patients History and Physical, chart, labs and discussed the procedure including the risks, benefits and alternatives for the proposed anesthesia with the patient or authorized representative who has indicated his/her understanding and acceptance.     Plan Discussed with:   Anesthesia Plan Comments:         Anesthesia Quick Evaluation  

## 2012-12-19 NOTE — Transfer of Care (Signed)
  Anesthesia Post-op Note  Patient: Sheila Briggs  Procedure(s) Performed: Procedure(s) (LRB) with comments: CATARACT EXTRACTION PHACO AND INTRAOCULAR LENS PLACEMENT (IOC) (Left) - CDE 13.75  Patient Location: PACU  Anesthesia Type: MAC  Level of Consciousness: awake, alert , oriented and patient cooperative  Airway and Oxygen Therapy: Patient Spontanous Breathing room air  Post-op Pain: mild  Post-op Assessment: Post-op Vital signs reviewed, Patient's Cardiovascular Status Stable, Respiratory Function Stable, Patent Airway and No signs of Nausea or vomiting  Post-op Vital Signs: Reviewed and stable  Complications: No apparent anesthesia complications

## 2012-12-19 NOTE — Anesthesia Postprocedure Evaluation (Signed)
  Anesthesia Post-op Note  Patient: Sheila Briggs  Procedure(s) Performed: Procedure(s) (LRB) with comments: CATARACT EXTRACTION PHACO AND INTRAOCULAR LENS PLACEMENT (IOC) (Left) - CDE 13.75  Patient Location: PACU  Anesthesia Type: MAC  Level of Consciousness: awake, alert , oriented and patient cooperative  Airway and Oxygen Therapy: Patient Spontanous Breathing room air  Post-op Pain: mild  Post-op Assessment: Post-op Vital signs reviewed, Patient's Cardiovascular Status Stable, Respiratory Function Stable, Patent Airway and No signs of Nausea or vomiting  Post-op Vital Signs: Reviewed and stable  Complications: No apparent anesthesia complications  

## 2012-12-19 NOTE — Preoperative (Signed)
Beta Blockers   Reason not to administer Beta Blockers:Not Applicable 

## 2012-12-20 ENCOUNTER — Encounter (HOSPITAL_COMMUNITY): Payer: Self-pay | Admitting: Ophthalmology

## 2012-12-20 NOTE — Op Note (Signed)
NAMEJULEE, STOLL                ACCOUNT NO.:  [id number]  MEDICAL RECORD NO.:  [id number]  LOCATION:  [facility]                          FACILITY:  [facility]  PHYSICIAN:  Susanne Greenhouse, MD       DATE OF BIRTH:  February 13, 1932  DATE OF PROCEDURE:  12/19/2012 DATE OF DISCHARGE:                              OPERATIVE REPORT   PREOPERATIVE DIAGNOSIS:  Combined cataract, left eye, diagnosis code 366.19.  POSTOPERATIVE DIAGNOSIS:  Combined cataract, left eye, diagnosis code 366.19.  OPERATION PERFORMED:  Phacoemulsification posterior chamber intraocular lens implantation, left eye.  ANESTHESIA:  Topical with monitored anesthesia care and IV sedation.  OPERATIVE SUMMARY:  In the preoperative area, dilating drops were placed into the left eye.  The patient was then brought into the operating room where she was placed under general anesthesia.  The eye was then prepped and draped.  Beginning with a 75 blade, a paracentesis port was made at the surgeon's 2 o'clock position.  The anterior chamber was then filled with a 1% nonpreserved lidocaine solution with epinephrine.  This was followed by Viscoat to deepen the chamber.  A small fornix-based peritomy was performed superiorly.  Next, a single iris hook was placed through the limbus superiorly.  A 2.4-mm keratome blade was then used to make a clear corneal incision over the iris hook.  A bent cystotome needle and Utrata forceps were used to create a continuous tear capsulotomy.  Hydrodissection was performed using balanced salt solution on a fine cannula.  The lens nucleus was then removed using phacoemulsification in a quadrant cracking technique.  The cortical material was then removed with irrigation and aspiration.  The capsular bag and anterior chamber were refilled with Provisc.  The wound was widened to approximately 3 mm and a posterior chamber intraocular lens was placed into the capsular bag without difficulty using an [employer] lens  injecting system.  A single 10-0 nylon suture was then used to close the incision as well as stromal hydration.  The Provisc was removed from the anterior chamber and capsular bag with irrigation and aspiration.  At this point, the wounds were tested for leak, which were negative.  The anterior chamber remained deep and stable.  The patient tolerated the procedure well.  There were no operative complications.  No surgical specimens.  Prosthetic device used is a Theme park manager, model EnVista, model number MX60, power of 24.0, serial number is [id number].          ______________________________ Susanne Greenhouse, MD    KEH/MEDQ  D:  12/19/2012  T:  12/20/2012  Job:  [id number]

## 2012-12-30 ENCOUNTER — Encounter (HOSPITAL_COMMUNITY): Payer: Self-pay | Admitting: Pharmacy Technician

## 2012-12-30 DIAGNOSIS — H2589 Other age-related cataract: Secondary | ICD-10-CM | POA: Diagnosis not present

## 2012-12-31 ENCOUNTER — Encounter (HOSPITAL_COMMUNITY)
Admission: RE | Admit: 2012-12-31 | Discharge: 2012-12-31 | Disposition: A | Discharge status: Home / Self Care | Payer: Medicare Other | Source: Ambulatory Visit | Attending: Ophthalmology | Admitting: Ophthalmology

## 2012-12-31 ENCOUNTER — Encounter (HOSPITAL_COMMUNITY): Payer: Self-pay

## 2013-01-03 MED ORDER — LIDOCAINE HCL (PF) 1 % IJ SOLN
INTRAMUSCULAR | Status: AC
  Filled 2013-01-03: qty 2

## 2013-01-03 MED ORDER — CYCLOPENTOLATE-PHENYLEPHRINE 0.2-1 % OP SOLN
OPHTHALMIC | Status: AC
  Filled 2013-01-03: qty 2

## 2013-01-03 MED ORDER — TETRACAINE HCL 0.5 % OP SOLN
OPHTHALMIC | Status: AC
  Filled 2013-01-03: qty 2

## 2013-01-03 MED ORDER — LIDOCAINE HCL 3.5 % OP GEL
OPHTHALMIC | Status: AC
  Filled 2013-01-03: qty 5

## 2013-01-03 MED ORDER — PHENYLEPHRINE HCL 2.5 % OP SOLN
OPHTHALMIC | Status: AC
  Filled 2013-01-03: qty 2

## 2013-01-03 MED ORDER — NEOMYCIN-POLYMYXIN-DEXAMETH 3.5-10000-0.1 OP OINT
TOPICAL_OINTMENT | OPHTHALMIC | Status: AC
  Filled 2013-01-03: qty 3.5

## 2013-01-06 ENCOUNTER — Encounter (HOSPITAL_COMMUNITY): Payer: Self-pay | Admitting: Anesthesiology

## 2013-01-06 ENCOUNTER — Encounter (HOSPITAL_COMMUNITY): Payer: Self-pay | Admitting: *Deleted

## 2013-01-06 ENCOUNTER — Ambulatory Visit (HOSPITAL_COMMUNITY)
Admission: RE | Admit: 2013-01-06 | Discharge: 2013-01-06 | Disposition: A | Discharge status: Home Health | Payer: Medicare Other | Source: Ambulatory Visit | Attending: Ophthalmology | Admitting: Ophthalmology

## 2013-01-06 ENCOUNTER — Encounter (HOSPITAL_COMMUNITY)
Admission: RE | Disposition: A | Discharge status: Home Health | Payer: Self-pay | Source: Ambulatory Visit | Attending: Ophthalmology

## 2013-01-06 ENCOUNTER — Ambulatory Visit (HOSPITAL_COMMUNITY): Payer: Medicare Other | Admitting: Anesthesiology

## 2013-01-06 DIAGNOSIS — H2589 Other age-related cataract: Secondary | ICD-10-CM | POA: Insufficient documentation

## 2013-01-06 DIAGNOSIS — H269 Unspecified cataract: Secondary | ICD-10-CM | POA: Diagnosis not present

## 2013-01-06 DIAGNOSIS — I10 Essential (primary) hypertension: Secondary | ICD-10-CM | POA: Insufficient documentation

## 2013-01-06 HISTORY — PX: CATARACT EXTRACTION W/PHACO: SHX586

## 2013-01-06 SURGERY — PHACOEMULSIFICATION, CATARACT, WITH IOL INSERTION
Anesthesia: Monitor Anesthesia Care | Site: Eye | Laterality: Right | Wound class: Clean

## 2013-01-06 MED ORDER — MIDAZOLAM HCL 2 MG/2ML IJ SOLN
1.0000 mg | INTRAMUSCULAR | Status: DC | PRN
  Administered 2013-01-06: 2 mg via INTRAVENOUS

## 2013-01-06 MED ORDER — LIDOCAINE 3.5 % OP GEL OPTIME - NO CHARGE
OPHTHALMIC | Status: DC | PRN
  Administered 2013-01-06: 1 [drp] via OPHTHALMIC

## 2013-01-06 MED ORDER — EPINEPHRINE HCL 1 MG/ML IJ SOLN
INTRAOCULAR | Status: DC | PRN
  Administered 2013-01-06: 10:00:00

## 2013-01-06 MED ORDER — EPINEPHRINE HCL 1 MG/ML IJ SOLN
INTRAMUSCULAR | Status: AC
  Filled 2013-01-06: qty 1

## 2013-01-06 MED ORDER — LIDOCAINE HCL 3.5 % OP GEL
1.0000 "application " | Freq: Once | OPHTHALMIC | Status: AC
  Administered 2013-01-06: 1 via OPHTHALMIC

## 2013-01-06 MED ORDER — LIDOCAINE HCL (PF) 1 % IJ SOLN
INTRAMUSCULAR | Status: DC | PRN
  Administered 2013-01-06: .6 mL

## 2013-01-06 MED ORDER — MIDAZOLAM HCL 2 MG/2ML IJ SOLN
INTRAMUSCULAR | Status: AC
  Filled 2013-01-06: qty 2

## 2013-01-06 MED ORDER — TETRACAINE HCL 0.5 % OP SOLN
1.0000 [drp] | OPHTHALMIC | Status: AC
  Administered 2013-01-06 (×3): 1 [drp] via OPHTHALMIC

## 2013-01-06 MED ORDER — BSS IO SOLN
INTRAOCULAR | Status: DC | PRN
  Administered 2013-01-06: 15 mL via INTRAOCULAR

## 2013-01-06 MED ORDER — PROVISC 10 MG/ML IO SOLN
INTRAOCULAR | Status: DC | PRN
  Administered 2013-01-06: 8.5 mg via INTRAOCULAR

## 2013-01-06 MED ORDER — POVIDONE-IODINE 5 % OP SOLN
OPHTHALMIC | Status: DC | PRN
  Administered 2013-01-06: 1 via OPHTHALMIC

## 2013-01-06 MED ORDER — PHENYLEPHRINE HCL 2.5 % OP SOLN
1.0000 [drp] | OPHTHALMIC | Status: AC
  Administered 2013-01-06 (×3): 1 [drp] via OPHTHALMIC

## 2013-01-06 MED ORDER — LACTATED RINGERS IV SOLN
INTRAVENOUS | Status: DC
  Administered 2013-01-06: 09:00:00 via INTRAVENOUS

## 2013-01-06 MED ORDER — CYCLOPENTOLATE-PHENYLEPHRINE 0.2-1 % OP SOLN
1.0000 [drp] | OPHTHALMIC | Status: AC
  Administered 2013-01-06 (×3): 1 [drp] via OPHTHALMIC

## 2013-01-06 SURGICAL SUPPLY — 31 items
CAPSULAR TENSION RING-AMO (OPHTHALMIC RELATED) IMPLANT
CLOTH BEACON ORANGE TIMEOUT ST (SAFETY) ×2 IMPLANT
EYE SHIELD UNIVERSAL CLEAR (GAUZE/BANDAGES/DRESSINGS) ×2 IMPLANT
GLOVE BIO SURGEON STRL SZ 6.5 (GLOVE) IMPLANT
GLOVE BIOGEL PI IND STRL 6.5 (GLOVE) ×1 IMPLANT
GLOVE BIOGEL PI IND STRL 7.0 (GLOVE) IMPLANT
GLOVE BIOGEL PI IND STRL 7.5 (GLOVE) IMPLANT
GLOVE BIOGEL PI INDICATOR 6.5 (GLOVE) ×1
GLOVE BIOGEL PI INDICATOR 7.0 (GLOVE)
GLOVE BIOGEL PI INDICATOR 7.5 (GLOVE)
GLOVE ECLIPSE 6.5 STRL STRAW (GLOVE) IMPLANT
GLOVE ECLIPSE 7.0 STRL STRAW (GLOVE) IMPLANT
GLOVE ECLIPSE 7.5 STRL STRAW (GLOVE) IMPLANT
GLOVE EXAM NITRILE LRG STRL (GLOVE) IMPLANT
GLOVE EXAM NITRILE MD LF STRL (GLOVE) ×2 IMPLANT
GLOVE SKINSENSE NS SZ6.5 (GLOVE)
GLOVE SKINSENSE NS SZ7.0 (GLOVE)
GLOVE SKINSENSE STRL SZ6.5 (GLOVE) IMPLANT
GLOVE SKINSENSE STRL SZ7.0 (GLOVE) IMPLANT
KIT VITRECTOMY (OPHTHALMIC RELATED) IMPLANT
PAD ARMBOARD 7.5X6 YLW CONV (MISCELLANEOUS) ×2 IMPLANT
PROC W NO LENS (INTRAOCULAR LENS)
PROC W SPEC LENS (INTRAOCULAR LENS)
PROCESS W NO LENS (INTRAOCULAR LENS) IMPLANT
PROCESS W SPEC LENS (INTRAOCULAR LENS) IMPLANT
RING MALYGIN (MISCELLANEOUS) IMPLANT
SIGHTPATH CAT PROC W REG LENS (Ophthalmic Related) ×2 IMPLANT
SYR TB 1ML LL NO SAFETY (SYRINGE) ×2 IMPLANT
TAPE CLOTH SOFT 2X10 (GAUZE/BANDAGES/DRESSINGS) ×2 IMPLANT
VISCOELASTIC ADDITIONAL (OPHTHALMIC RELATED) IMPLANT
WATER STERILE IRR 250ML POUR (IV SOLUTION) ×2 IMPLANT

## 2013-01-06 NOTE — Transfer of Care (Signed)
Immediate Anesthesia Transfer of Care Note  Patient: Sheila Briggs  Procedure(s) Performed: Procedure(s) (LRB): CATARACT EXTRACTION PHACO AND INTRAOCULAR LENS PLACEMENT (IOC) (Right)  Patient Location: Shortstay  Anesthesia Type: MAC  Level of Consciousness: awake  Airway & Oxygen Therapy: Patient Spontanous Breathing   Post-op Assessment: Report given to PACU RN, Post -op Vital signs reviewed and stable and Patient moving all extremities  Post vital signs: Reviewed and stable  Complications: No apparent anesthesia complications

## 2013-01-06 NOTE — Anesthesia Preprocedure Evaluation (Signed)
Anesthesia Evaluation  Patient identified by MRN, date of birth, ID band Patient awake    Reviewed: Allergy & Precautions, H&P , NPO status , Patient's Chart, lab work & pertinent test results  Airway Mallampati: II TM Distance: <3 FB     Dental  (+) Teeth Intact   Pulmonary  breath sounds clear to auscultation        Cardiovascular hypertension, Pt. on medications Rhythm:Regular Rate:Normal     Neuro/Psych PSYCHIATRIC DISORDERS Anxiety    GI/Hepatic GERD-  ,  Endo/Other  Hypothyroidism   Renal/GU      Musculoskeletal   Abdominal   Peds  Hematology   Anesthesia Other Findings   Reproductive/Obstetrics                           Anesthesia Physical Anesthesia Plan  ASA: II  Anesthesia Plan: MAC   Post-op Pain Management:    Induction: Intravenous  Airway Management Planned: Nasal Cannula  Additional Equipment:   Intra-op Plan:   Post-operative Plan:   Informed Consent: I have reviewed the patients History and Physical, chart, labs and discussed the procedure including the risks, benefits and alternatives for the proposed anesthesia with the patient or authorized representative who has indicated his/her understanding and acceptance.     Plan Discussed with:   Anesthesia Plan Comments:         Anesthesia Quick Evaluation

## 2013-01-06 NOTE — Op Note (Signed)
NAMEVIANNE, GRIESHOP                ACCOUNT NO.:  [id number]  MEDICAL RECORD NO.:  [id number]  LOCATION:  [facility]                          FACILITY:  [facility]  PHYSICIAN:  Susanne Greenhouse, MD       DATE OF BIRTH:  1932/08/08  DATE OF PROCEDURE: DATE OF DISCHARGE:  01/06/2013                              OPERATIVE REPORT   PREOPERATIVE DIAGNOSIS:  Combined cataract, right eye, diagnosis code 366.19.  POSTOPERATIVE DIAGNOSIS:  Combined cataract, right eye, diagnosis code 366.19.  ANESTHESIA:  Topical with monitored anesthesia care and IV sedation.  PROCEDURE:  Phacoemulsification posterior chamber intraocular lens implantation, right eye.  SURGEON:  Bonne Dolores. Mynor Witkop, MD.  OPERATIVE SUMMARY:  In the preoperative area, dilating drops were placed into the right eye.  The patient was then brought into the operating room where she was placed under general anesthesia.  The eye was then prepped and draped.  Beginning with a 75 blade, a paracentesis port was made at the surgeon's 2 o'clock position.  The anterior chamber was then filled with a 1% nonpreserved lidocaine solution with epinephrine.  This was followed by Viscoat to deepen the chamber.  A small fornix-based peritomy was performed superiorly.  Next, a single iris hook was placed through the limbus superiorly.  A 2.4-mm keratome blade was then used to make a clear corneal incision over the iris hook.  A bent cystotome needle and Utrata forceps were used to create a continuous tear capsulotomy.  Hydrodissection was performed using balanced salt solution on a fine cannula.  The lens nucleus was then removed using phacoemulsification in a quadrant cracking technique.  The cortical material was then removed with irrigation and aspiration.  The capsular bag and anterior chamber were refilled with Provisc.  The wound was widened to approximately 3 mm and a posterior chamber intraocular lens was placed into the capsular bag without difficulty  using an [employer] lens injecting system.  A single 10-0 nylon suture was then used to close the incision as well as stromal hydration.  The Provisc was removed from the anterior chamber and capsular bag with irrigation and aspiration.  At this point, the wounds were tested for leak, which were negative.  The anterior chamber remained deep and stable.  The patient tolerated the procedure well.  There were no operative complications, and she awoke from general anesthesia without problem.  No surgical specimens.  Prosthetic device used is a Theme park manager, model EnVista, model# MX60, power of 24.5, serial number is [id number].          ______________________________ Susanne Greenhouse, MD     KEH/MEDQ  D:  01/06/2013  T:  01/06/2013  Job:  [id number]

## 2013-01-06 NOTE — H&P (Signed)
I have reviewed the H&P, the patient was re-examined, and I have identified no interval changes in medical condition and plan of care since the history and physical of record  

## 2013-01-06 NOTE — Anesthesia Procedure Notes (Signed)
Procedure Name: MAC Date/Time: 01/06/2013 10:10 AM Performed by: Franco Nones Pre-anesthesia Checklist: Patient identified, Emergency Drugs available, Suction available, Timeout performed and Patient being monitored Patient Re-evaluated:Patient Re-evaluated prior to inductionOxygen Delivery Method: Nasal Cannula

## 2013-01-06 NOTE — Anesthesia Postprocedure Evaluation (Signed)
  Anesthesia Post-op Note  Patient: Sheila Briggs  Procedure(s) Performed: Procedure(s) (LRB): CATARACT EXTRACTION PHACO AND INTRAOCULAR LENS PLACEMENT (IOC) (Right)  Patient Location:  Short Stay  Anesthesia Type: MAC  Level of Consciousness: awake  Airway and Oxygen Therapy: Patient Spontanous Breathing  Post-op Pain: none  Post-op Assessment: Post-op Vital signs reviewed, Patient's Cardiovascular Status Stable, Respiratory Function Stable, Patent Airway, No signs of Nausea or vomiting and Pain level controlled  Post-op Vital Signs: Reviewed and stable  Complications: No apparent anesthesia complications

## 2013-01-06 NOTE — Brief Op Note (Signed)
Pre-Op Dx: Cataract OD Post-Op Dx: Cataract OD Surgeon: Areta Terwilliger Anesthesia: Topical with MAC Surgery: Cataract Extraction with Intraocular lens Implant OD Implant: B&L enVista Specimen: None Complications: None 

## 2013-01-07 MED ORDER — NEOMYCIN-POLYMYXIN-DEXAMETH 0.1 % OP OINT
TOPICAL_OINTMENT | OPHTHALMIC | Status: DC | PRN
  Administered 2013-01-06: 1 via OPHTHALMIC

## 2013-01-08 ENCOUNTER — Encounter (HOSPITAL_COMMUNITY): Payer: Self-pay | Admitting: Ophthalmology

## 2013-01-14 ENCOUNTER — Other Ambulatory Visit (HOSPITAL_COMMUNITY): Payer: Self-pay | Admitting: Internal Medicine

## 2013-01-14 DIAGNOSIS — Z09 Encounter for follow-up examination after completed treatment for conditions other than malignant neoplasm: Secondary | ICD-10-CM

## 2013-02-24 DIAGNOSIS — I10 Essential (primary) hypertension: Secondary | ICD-10-CM | POA: Diagnosis not present

## 2013-02-24 DIAGNOSIS — K219 Gastro-esophageal reflux disease without esophagitis: Secondary | ICD-10-CM | POA: Diagnosis not present

## 2013-02-26 ENCOUNTER — Ambulatory Visit (HOSPITAL_COMMUNITY)
Admission: RE | Admit: 2013-02-26 | Discharge: 2013-02-26 | Disposition: A | Discharge status: Home / Self Care | Payer: Medicare Other | Source: Ambulatory Visit | Attending: Internal Medicine | Admitting: Internal Medicine

## 2013-02-26 DIAGNOSIS — R928 Other abnormal and inconclusive findings on diagnostic imaging of breast: Secondary | ICD-10-CM | POA: Diagnosis not present

## 2013-02-26 DIAGNOSIS — N6489 Other specified disorders of breast: Secondary | ICD-10-CM | POA: Diagnosis not present

## 2013-02-26 DIAGNOSIS — Z09 Encounter for follow-up examination after completed treatment for conditions other than malignant neoplasm: Secondary | ICD-10-CM

## 2013-03-17 DIAGNOSIS — Z961 Presence of intraocular lens: Secondary | ICD-10-CM | POA: Diagnosis not present

## 2013-03-17 DIAGNOSIS — H35359 Cystoid macular degeneration, unspecified eye: Secondary | ICD-10-CM | POA: Diagnosis not present

## 2013-03-27 DIAGNOSIS — L821 Other seborrheic keratosis: Secondary | ICD-10-CM | POA: Diagnosis not present

## 2013-03-27 DIAGNOSIS — D235 Other benign neoplasm of skin of trunk: Secondary | ICD-10-CM | POA: Diagnosis not present

## 2013-03-27 DIAGNOSIS — C4442 Squamous cell carcinoma of skin of scalp and neck: Secondary | ICD-10-CM | POA: Diagnosis not present

## 2013-04-14 DIAGNOSIS — Z961 Presence of intraocular lens: Secondary | ICD-10-CM | POA: Diagnosis not present

## 2013-04-14 DIAGNOSIS — H04129 Dry eye syndrome of unspecified lacrimal gland: Secondary | ICD-10-CM | POA: Diagnosis not present

## 2013-04-14 DIAGNOSIS — K219 Gastro-esophageal reflux disease without esophagitis: Secondary | ICD-10-CM | POA: Diagnosis not present

## 2013-04-14 DIAGNOSIS — H3581 Retinal edema: Secondary | ICD-10-CM | POA: Diagnosis not present

## 2013-05-01 DIAGNOSIS — Z85828 Personal history of other malignant neoplasm of skin: Secondary | ICD-10-CM | POA: Diagnosis not present

## 2013-05-19 DIAGNOSIS — Z961 Presence of intraocular lens: Secondary | ICD-10-CM | POA: Diagnosis not present

## 2013-05-19 DIAGNOSIS — H3581 Retinal edema: Secondary | ICD-10-CM | POA: Diagnosis not present

## 2013-06-19 DIAGNOSIS — E785 Hyperlipidemia, unspecified: Secondary | ICD-10-CM | POA: Diagnosis not present

## 2013-06-19 DIAGNOSIS — E039 Hypothyroidism, unspecified: Secondary | ICD-10-CM | POA: Diagnosis not present

## 2013-06-19 DIAGNOSIS — Z79899 Other long term (current) drug therapy: Secondary | ICD-10-CM | POA: Diagnosis not present

## 2013-06-19 DIAGNOSIS — I10 Essential (primary) hypertension: Secondary | ICD-10-CM | POA: Diagnosis not present

## 2013-06-20 DIAGNOSIS — J0100 Acute maxillary sinusitis, unspecified: Secondary | ICD-10-CM | POA: Diagnosis not present

## 2013-06-26 ENCOUNTER — Other Ambulatory Visit (HOSPITAL_COMMUNITY): Payer: Self-pay | Admitting: Internal Medicine

## 2013-06-26 DIAGNOSIS — I10 Essential (primary) hypertension: Secondary | ICD-10-CM | POA: Diagnosis not present

## 2013-06-26 DIAGNOSIS — I6521 Occlusion and stenosis of right carotid artery: Secondary | ICD-10-CM

## 2013-06-26 DIAGNOSIS — Z1212 Encounter for screening for malignant neoplasm of rectum: Secondary | ICD-10-CM | POA: Diagnosis not present

## 2013-06-26 DIAGNOSIS — E785 Hyperlipidemia, unspecified: Secondary | ICD-10-CM | POA: Diagnosis not present

## 2013-06-26 DIAGNOSIS — E039 Hypothyroidism, unspecified: Secondary | ICD-10-CM | POA: Diagnosis not present

## 2013-07-01 ENCOUNTER — Ambulatory Visit (HOSPITAL_COMMUNITY)
Admission: RE | Admit: 2013-07-01 | Discharge: 2013-07-01 | Disposition: A | Discharge status: Home / Self Care | Payer: Medicare Other | Source: Ambulatory Visit | Attending: Internal Medicine | Admitting: Internal Medicine

## 2013-07-01 ENCOUNTER — Other Ambulatory Visit (HOSPITAL_COMMUNITY): Payer: Self-pay | Admitting: Internal Medicine

## 2013-07-01 DIAGNOSIS — E7800 Pure hypercholesterolemia, unspecified: Secondary | ICD-10-CM | POA: Insufficient documentation

## 2013-07-01 DIAGNOSIS — I6529 Occlusion and stenosis of unspecified carotid artery: Secondary | ICD-10-CM | POA: Insufficient documentation

## 2013-07-01 DIAGNOSIS — I658 Occlusion and stenosis of other precerebral arteries: Secondary | ICD-10-CM | POA: Diagnosis not present

## 2013-07-01 DIAGNOSIS — I10 Essential (primary) hypertension: Secondary | ICD-10-CM | POA: Diagnosis not present

## 2013-07-01 DIAGNOSIS — I6521 Occlusion and stenosis of right carotid artery: Secondary | ICD-10-CM

## 2013-08-28 DIAGNOSIS — Z23 Encounter for immunization: Secondary | ICD-10-CM | POA: Diagnosis not present

## 2013-09-11 DIAGNOSIS — Z79899 Other long term (current) drug therapy: Secondary | ICD-10-CM | POA: Diagnosis not present

## 2013-09-11 DIAGNOSIS — E785 Hyperlipidemia, unspecified: Secondary | ICD-10-CM | POA: Diagnosis not present

## 2013-09-17 DIAGNOSIS — E0510 Thyrotoxicosis with toxic single thyroid nodule without thyrotoxic crisis or storm: Secondary | ICD-10-CM | POA: Diagnosis not present

## 2013-09-17 DIAGNOSIS — E785 Hyperlipidemia, unspecified: Secondary | ICD-10-CM | POA: Diagnosis not present

## 2013-09-17 DIAGNOSIS — I10 Essential (primary) hypertension: Secondary | ICD-10-CM | POA: Diagnosis not present

## 2013-10-16 ENCOUNTER — Ambulatory Visit (INDEPENDENT_AMBULATORY_CARE_PROVIDER_SITE_OTHER): Payer: Medicare Other | Admitting: Otolaryngology

## 2013-10-16 ENCOUNTER — Encounter (INDEPENDENT_AMBULATORY_CARE_PROVIDER_SITE_OTHER): Payer: Self-pay

## 2013-10-16 DIAGNOSIS — D449 Neoplasm of uncertain behavior of unspecified endocrine gland: Secondary | ICD-10-CM

## 2013-10-17 ENCOUNTER — Other Ambulatory Visit (INDEPENDENT_AMBULATORY_CARE_PROVIDER_SITE_OTHER): Payer: Self-pay | Admitting: Otolaryngology

## 2013-10-17 DIAGNOSIS — R221 Localized swelling, mass and lump, neck: Secondary | ICD-10-CM

## 2013-10-21 ENCOUNTER — Ambulatory Visit (HOSPITAL_COMMUNITY)
Admission: RE | Admit: 2013-10-21 | Discharge: 2013-10-21 | Disposition: A | Discharge status: Home / Self Care | Payer: Medicare Other | Source: Ambulatory Visit | Attending: Otolaryngology | Admitting: Otolaryngology

## 2013-10-21 DIAGNOSIS — E042 Nontoxic multinodular goiter: Secondary | ICD-10-CM | POA: Insufficient documentation

## 2013-10-21 DIAGNOSIS — R221 Localized swelling, mass and lump, neck: Secondary | ICD-10-CM

## 2013-10-21 DIAGNOSIS — E041 Nontoxic single thyroid nodule: Secondary | ICD-10-CM | POA: Diagnosis not present

## 2013-11-13 DIAGNOSIS — I639 Cerebral infarction, unspecified: Secondary | ICD-10-CM

## 2013-11-13 HISTORY — DX: Cerebral infarction, unspecified: I63.9

## 2013-11-17 DIAGNOSIS — H04129 Dry eye syndrome of unspecified lacrimal gland: Secondary | ICD-10-CM | POA: Diagnosis not present

## 2013-11-24 DIAGNOSIS — L570 Actinic keratosis: Secondary | ICD-10-CM | POA: Diagnosis not present

## 2013-11-24 DIAGNOSIS — D235 Other benign neoplasm of skin of trunk: Secondary | ICD-10-CM | POA: Diagnosis not present

## 2013-11-24 DIAGNOSIS — Z85828 Personal history of other malignant neoplasm of skin: Secondary | ICD-10-CM | POA: Diagnosis not present

## 2013-11-24 DIAGNOSIS — L821 Other seborrheic keratosis: Secondary | ICD-10-CM | POA: Diagnosis not present

## 2014-01-01 ENCOUNTER — Ambulatory Visit: Payer: Medicare Other | Admitting: Orthopedic Surgery

## 2014-01-20 DIAGNOSIS — I10 Essential (primary) hypertension: Secondary | ICD-10-CM | POA: Diagnosis not present

## 2014-01-20 DIAGNOSIS — R21 Rash and other nonspecific skin eruption: Secondary | ICD-10-CM | POA: Diagnosis not present

## 2014-01-22 ENCOUNTER — Ambulatory Visit (INDEPENDENT_AMBULATORY_CARE_PROVIDER_SITE_OTHER): Payer: Medicare Other | Admitting: Otolaryngology

## 2014-01-22 DIAGNOSIS — J343 Hypertrophy of nasal turbinates: Secondary | ICD-10-CM | POA: Diagnosis not present

## 2014-01-22 DIAGNOSIS — J0100 Acute maxillary sinusitis, unspecified: Secondary | ICD-10-CM | POA: Diagnosis not present

## 2014-01-22 DIAGNOSIS — J310 Chronic rhinitis: Secondary | ICD-10-CM

## 2014-01-29 ENCOUNTER — Ambulatory Visit (INDEPENDENT_AMBULATORY_CARE_PROVIDER_SITE_OTHER): Payer: Medicare Other

## 2014-01-29 ENCOUNTER — Ambulatory Visit (INDEPENDENT_AMBULATORY_CARE_PROVIDER_SITE_OTHER): Payer: Medicare Other | Admitting: Orthopedic Surgery

## 2014-01-29 ENCOUNTER — Encounter: Payer: Self-pay | Admitting: Orthopedic Surgery

## 2014-01-29 VITALS — BP 133/66 | Ht 62.0 in | Wt 125.0 lb

## 2014-01-29 DIAGNOSIS — M25562 Pain in left knee: Secondary | ICD-10-CM

## 2014-01-29 DIAGNOSIS — M25569 Pain in unspecified knee: Secondary | ICD-10-CM

## 2014-01-29 DIAGNOSIS — M179 Osteoarthritis of knee, unspecified: Secondary | ICD-10-CM

## 2014-01-29 DIAGNOSIS — IMO0002 Reserved for concepts with insufficient information to code with codable children: Secondary | ICD-10-CM

## 2014-01-29 DIAGNOSIS — M1710 Unilateral primary osteoarthritis, unspecified knee: Secondary | ICD-10-CM | POA: Diagnosis not present

## 2014-01-29 NOTE — Progress Notes (Signed)
Patient ID: Sheila Briggs, female   DOB: 15-Jun-1932, 80 y.o.   MRN: [id number]  Chief Complaint  Patient presents with  . Knee Pain    Left knee pain, no injury/recurrent knee pain     HISTORY:  80 year old female last seen in 2011 for the left knee presents with recurrent left knee pain for several months with gradual onset. Complains of sharp dull medial pain 7/10 it seems to come and go worse at night and with weightbearing no catching complaints of swelling and tightness  System review complains of eye redness heartburn easy bleeding bruising temperature intolerance and seasonal allergies. All other systems were reviewed and were negative  She has some hypertension and thyroid disease. Denies any surgery. Family physician Dr. Asencion Noble  Family history heart disease arthritis  Social history widowed does not smoke or drink  Vital signs: BP 133/66  Ht 5\' 2"  (1.575 m)  Wt 125 lb (56.7 kg)  BMI 22.86 kg/m2  General the patient is well-developed and well-nourished grooming and hygiene are normal Oriented x3 Mood and affect normal Ambulation normal  Inspection of the right knee reveals full range of motion stable ligaments normal muscle tone normal skin no tenderness or swelling  Left knee inspection reveals medial joint line tenderness decreased range of motion stable ligaments normal motor exam normal skin  Bilateral cardiovascular sensory findings in terms of pulses and swelling normal   Today's x-ray shows mild symmetric joint space narrowing  Impression osteoarthritis cannot rule out medial meniscal tear  Injection followup in one month if no improvement consider imaging with MRI

## 2014-01-29 NOTE — Patient Instructions (Signed)

## 2014-02-01 IMAGING — US US CAROTID DUPLEX BILAT
1 series · 13 of 24 positions shown · non-contrast
Comparison: 06/20/2012

CLINICAL DATA: Right carotid stenosis, history hypertension,
hypercholesterolemia

BILATERAL CAROTID DUPLEX ULTRASOUND
TECHNIQUE: Gray scale imaging, color Doppler and duplex ultrasound
were performed of bilateral carotid and vertebral arteries in the
neck.

[Series 1: us carotid duplex bilat · 0.05mm/px · 13 of 72 slices shown]
[im 1/72]
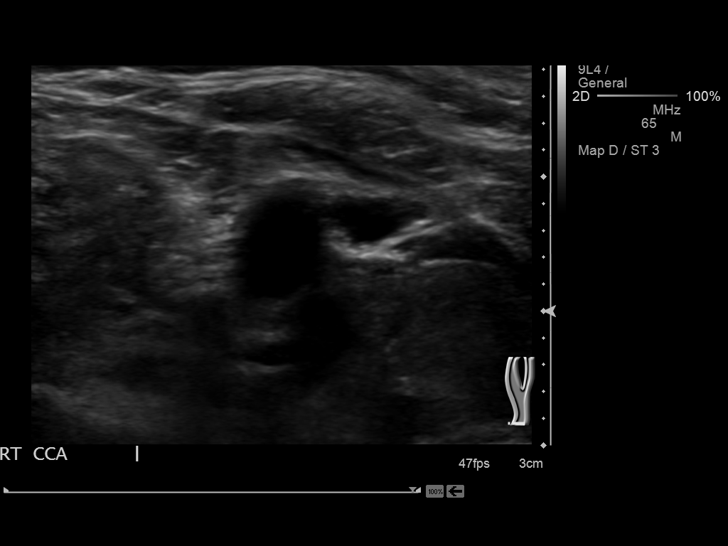
[im 7/72]
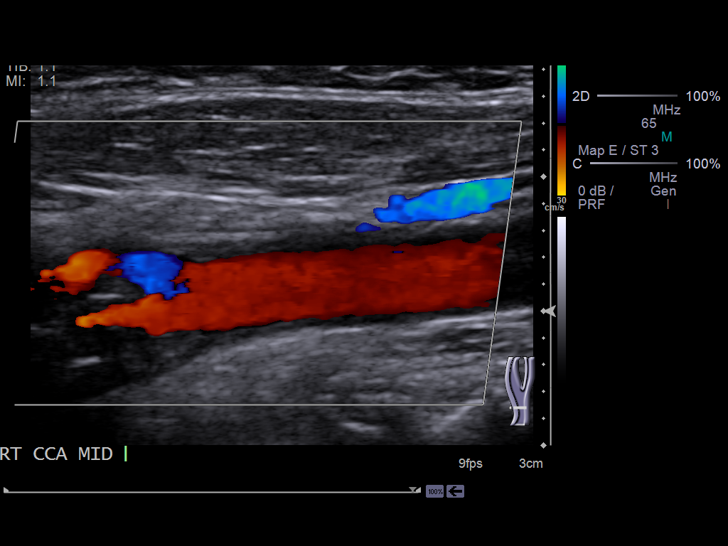
[im 13/72]
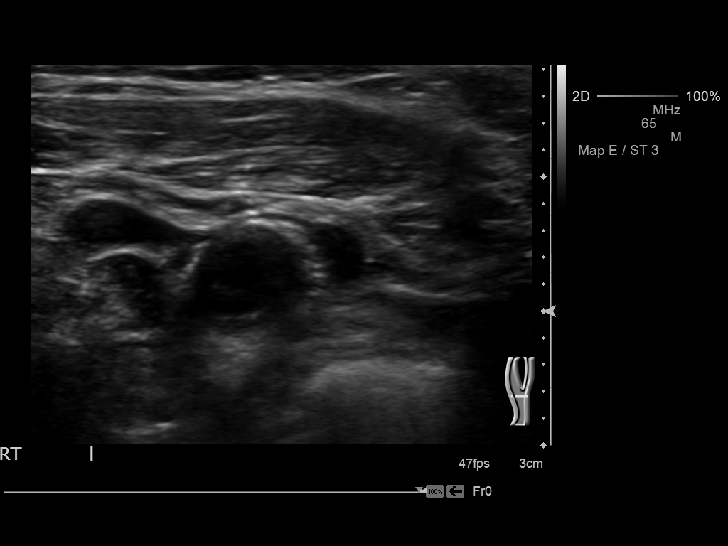
[im 19/72]
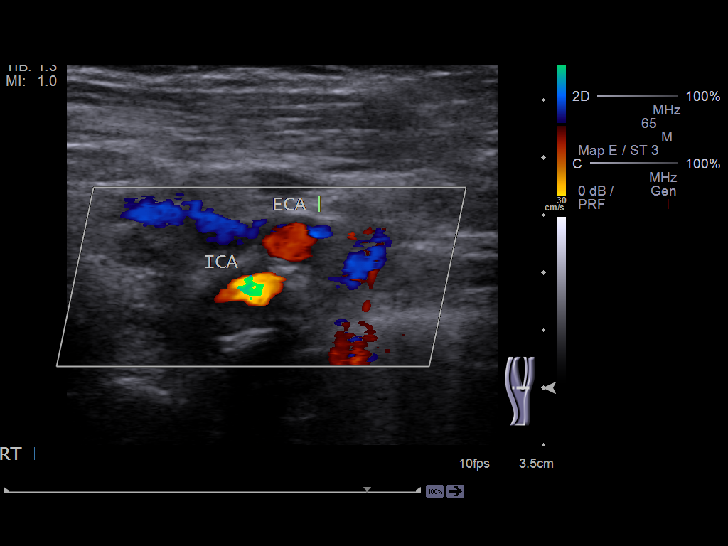
[im 25/72]
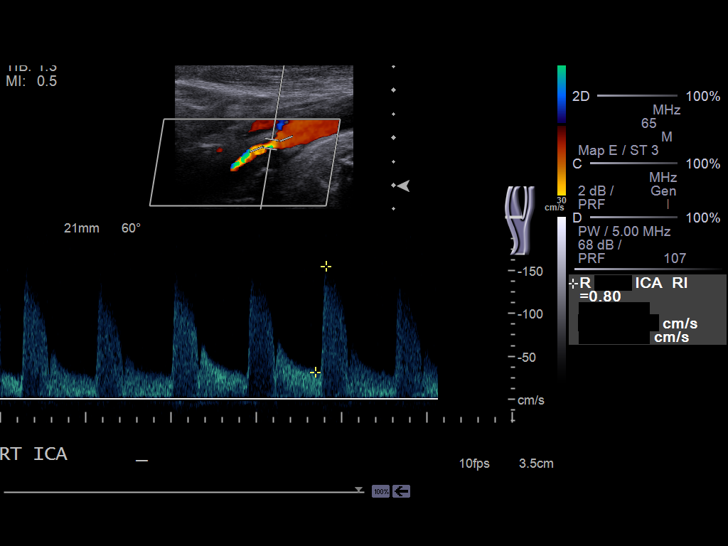
[im 31/72]
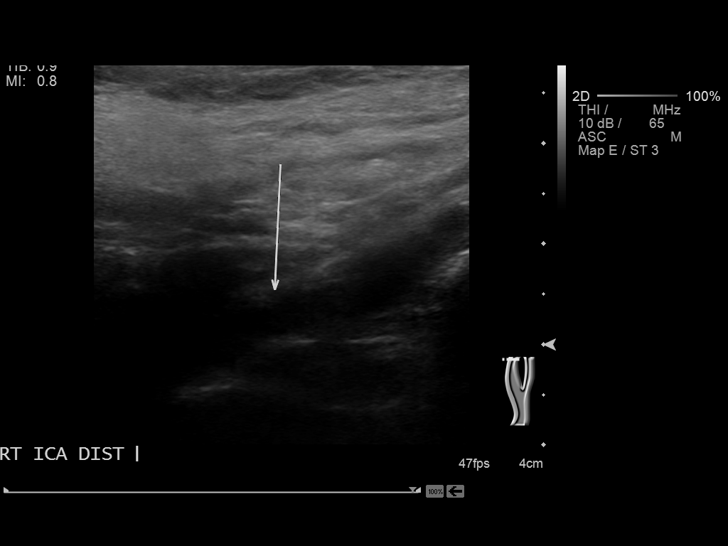
[im 38/72]
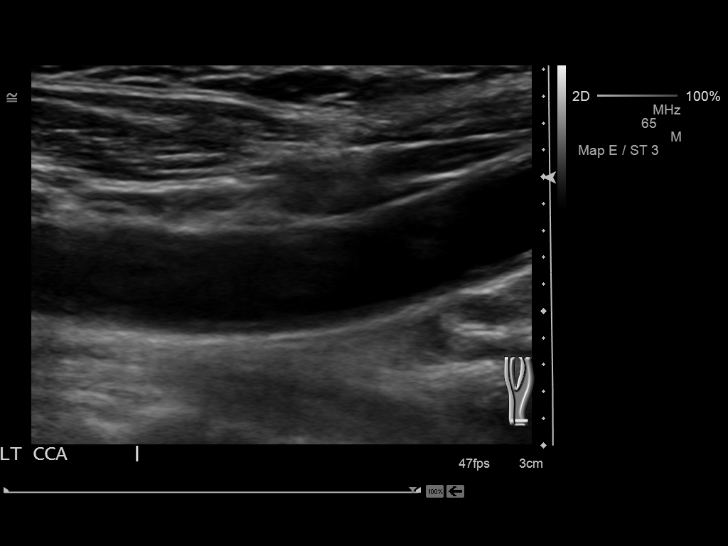
[im 41/72]
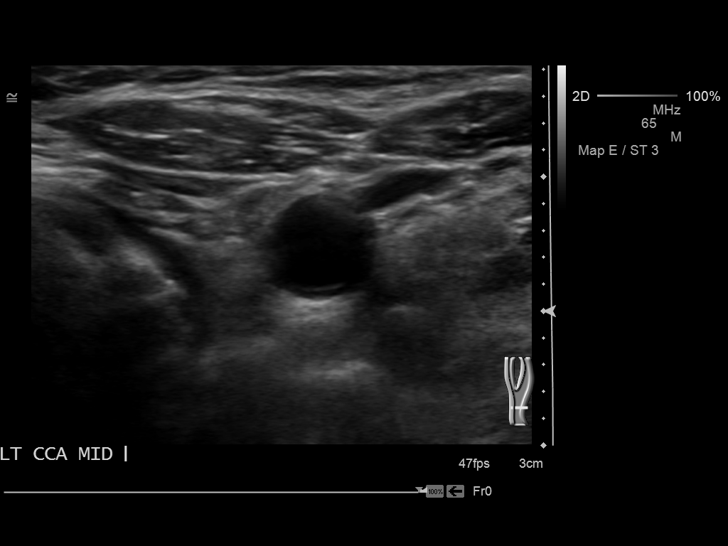
[im 47/72]
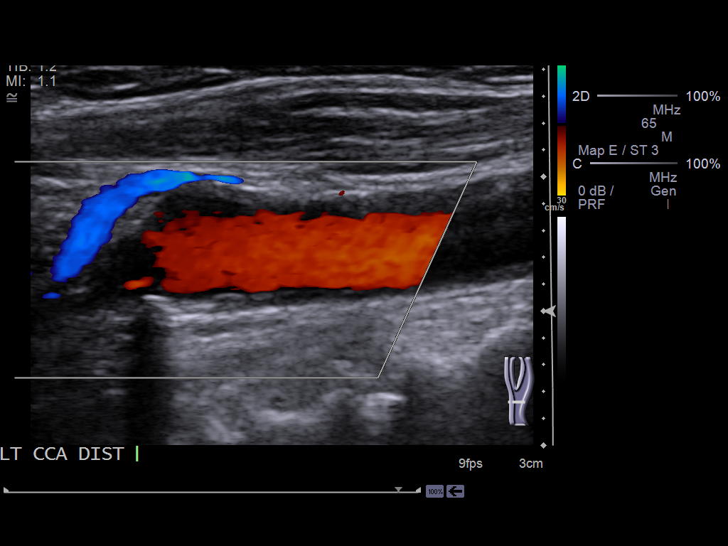
[im 53/72]
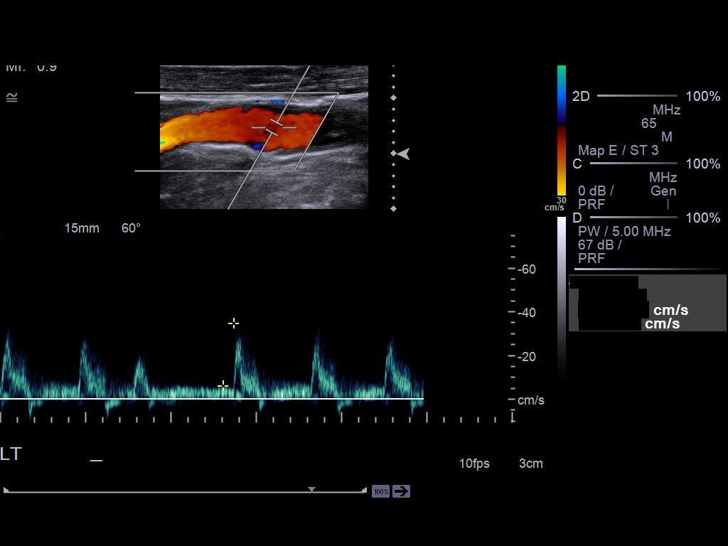
[im 59/72]
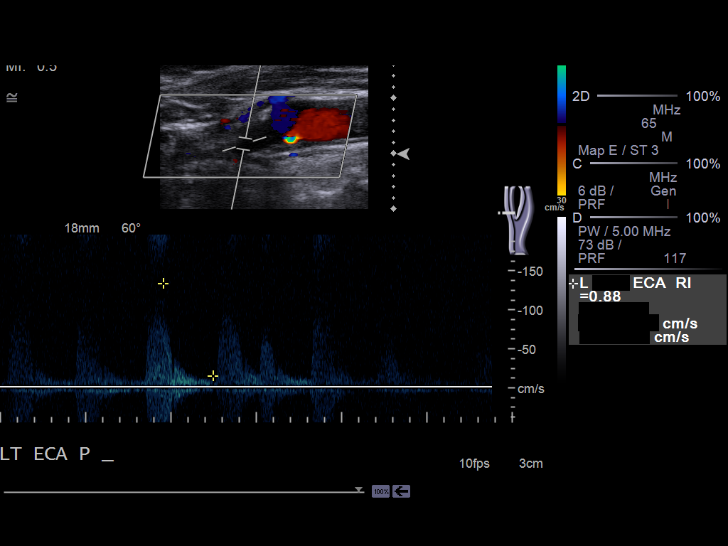
[im 65/72]
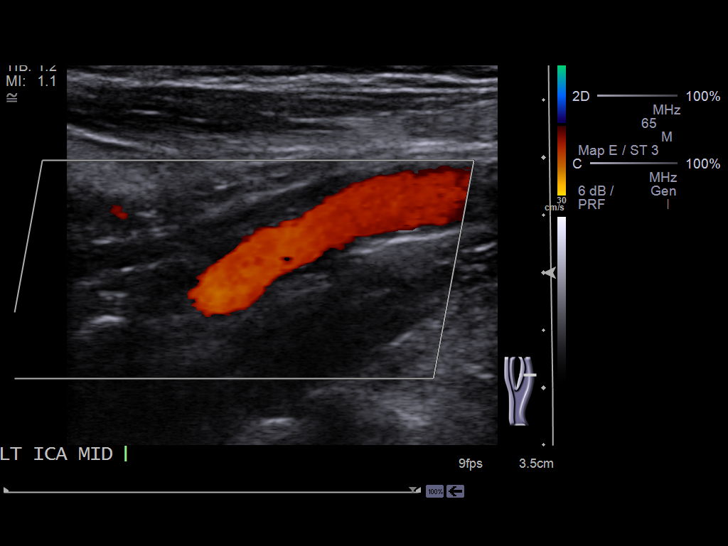
[im 72/72]
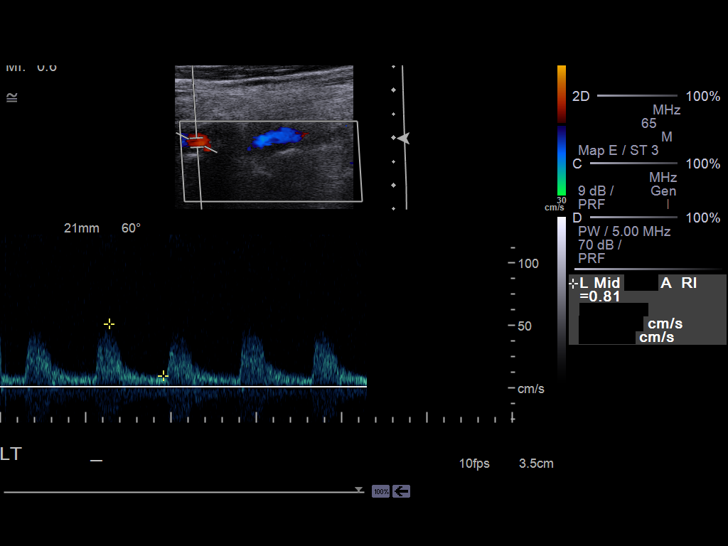

[13 of 24 positions shown; findings below may reference images not displayed]

Criteria:  Quantification of carotid stenosis is based on velocity
parameters that correlate the residual internal carotid diameter
with NASCET-based stenosis levels, using the diameter of the distal
internal carotid lumen as the denominator for stenosis measurement.

The following velocity measurements were obtained:

                 PEAK SYSTOLIC/END DIASTOLIC
RIGHT
ICA:                        155/32cm/sec
CCA:                        73/12cm/sec
SYSTOLIC ICA/CCA RATIO:
DIASTOLIC ICA/CCA RATIO:
ECA:                        124cm/sec

LEFT
ICA:                        68/16cm/sec
CCA:                        56/11cm/sec
SYSTOLIC ICA/CCA RATIO:
DIASTOLIC ICA/CCA RATIO:
ECA:                        134cm/sec
FINDINGS: RIGHT CAROTID ARTERY: Intimal thickening right CCA.  Mixed
hypoechoic and echogenic nonshadowing plaque at the right carotid
bulb extending into proximal right ICA.  Associated turbulent flow
on color Doppler imaging with spectral broadening on waveform
analysis.  High velocity jet noted at proximal right ICA.  Patient
arrhythmic during imaging. Distal right ICA is tortuous and deep.

RIGHT VERTEBRAL ARTERY:  Patent, antegrade

LEFT CAROTID ARTERY: Intimal thickening and noncalcified plaque in
left CCA.  Small amount of noncalcified plaque at the left carotid
bulb into the proximal left ICA.  More prominent hypoechoic plaque
identified at the proximal left ECA, which has at least a high-
grade stenosis.  Spectral broadening on waveform analysis in the
left ECA and left ICA.  No high velocity jets.

LEFT VERTEBRAL ARTERY:  Patent, antegrade
IMPRESSION: Plaque formation in the carotid systems bilaterally greatest at the
distal right carotid bulb into the proximal right ICA.
Velocities in the right ICA correspond to a 50-69% diameter
stenosis.
Velocities in the left ICA correspond to a less than 50% diameter
stenosis.

When compared to the previous exam, little interval change.
Yearly surveillance recommended.

## 2014-02-09 ENCOUNTER — Other Ambulatory Visit (HOSPITAL_COMMUNITY): Payer: Self-pay | Admitting: Internal Medicine

## 2014-02-09 DIAGNOSIS — Z1231 Encounter for screening mammogram for malignant neoplasm of breast: Secondary | ICD-10-CM

## 2014-02-19 ENCOUNTER — Ambulatory Visit (INDEPENDENT_AMBULATORY_CARE_PROVIDER_SITE_OTHER): Payer: Medicare Other | Admitting: Otolaryngology

## 2014-02-19 DIAGNOSIS — J310 Chronic rhinitis: Secondary | ICD-10-CM

## 2014-03-03 ENCOUNTER — Encounter: Payer: Self-pay | Admitting: Orthopedic Surgery

## 2014-03-03 ENCOUNTER — Ambulatory Visit (INDEPENDENT_AMBULATORY_CARE_PROVIDER_SITE_OTHER): Payer: Medicare Other | Admitting: Orthopedic Surgery

## 2014-03-03 VITALS — BP 133/66 | Ht 62.0 in | Wt 125.0 lb

## 2014-03-03 DIAGNOSIS — M1710 Unilateral primary osteoarthritis, unspecified knee: Secondary | ICD-10-CM | POA: Diagnosis not present

## 2014-03-03 DIAGNOSIS — M179 Osteoarthritis of knee, unspecified: Secondary | ICD-10-CM

## 2014-03-03 DIAGNOSIS — IMO0002 Reserved for concepts with insufficient information to code with codable children: Secondary | ICD-10-CM | POA: Diagnosis not present

## 2014-03-03 DIAGNOSIS — M25562 Pain in left knee: Secondary | ICD-10-CM

## 2014-03-03 DIAGNOSIS — M25569 Pain in unspecified knee: Secondary | ICD-10-CM

## 2014-03-03 NOTE — Patient Instructions (Signed)
activities as tolerated 

## 2014-03-03 NOTE — Progress Notes (Signed)
Patient ID: Sheila Briggs, female   DOB: 09-Jan-1932, 80 y.o.   MRN: [id number]  Chief Complaint  Patient presents with  . Follow-up    one month recheck left knee s/p injection    80 year old female presented to [place] with recurrent pain in her left knee we gave her an injection after x-ray showed some mild arthrosis. She improved with decreased pain decreased swelling and improved ambulatory function  Review of systems no mechanical symptoms at this time  Exam as follows BP 133/66  Ht 5\' 2"  (1.575 m)  Wt 125 lb (56.7 kg)  BMI 22.86 kg/m2 General appearance is normal, the patient is alert and oriented x3 with normal mood and affect. The knee is nontender without swelling. Range of motion is normal ligaments are stable McMurray sign is negative  Impression Encounter Diagnoses  Name Primary?  . Left knee pain   . Arthritis of knee, degenerative Yes    Return as needed

## 2014-03-09 ENCOUNTER — Ambulatory Visit (HOSPITAL_COMMUNITY)
Admission: RE | Admit: 2014-03-09 | Discharge: 2014-03-09 | Disposition: A | Discharge status: Home / Self Care | Payer: Medicare Other | Source: Ambulatory Visit | Attending: Internal Medicine | Admitting: Internal Medicine

## 2014-03-09 DIAGNOSIS — Z1231 Encounter for screening mammogram for malignant neoplasm of breast: Secondary | ICD-10-CM | POA: Diagnosis not present

## 2014-04-02 DIAGNOSIS — J210 Acute bronchiolitis due to respiratory syncytial virus: Secondary | ICD-10-CM | POA: Diagnosis not present

## 2014-04-02 DIAGNOSIS — J069 Acute upper respiratory infection, unspecified: Secondary | ICD-10-CM | POA: Diagnosis not present

## 2014-04-27 DIAGNOSIS — J392 Other diseases of pharynx: Secondary | ICD-10-CM | POA: Diagnosis not present

## 2014-05-07 ENCOUNTER — Ambulatory Visit (INDEPENDENT_AMBULATORY_CARE_PROVIDER_SITE_OTHER): Payer: Medicare Other | Admitting: Otolaryngology

## 2014-05-07 DIAGNOSIS — J0100 Acute maxillary sinusitis, unspecified: Secondary | ICD-10-CM

## 2014-05-07 DIAGNOSIS — J310 Chronic rhinitis: Secondary | ICD-10-CM

## 2014-05-07 DIAGNOSIS — J343 Hypertrophy of nasal turbinates: Secondary | ICD-10-CM

## 2014-05-18 DIAGNOSIS — R42 Dizziness and giddiness: Secondary | ICD-10-CM | POA: Diagnosis not present

## 2014-05-26 DIAGNOSIS — H04129 Dry eye syndrome of unspecified lacrimal gland: Secondary | ICD-10-CM | POA: Diagnosis not present

## 2014-05-28 ENCOUNTER — Ambulatory Visit (INDEPENDENT_AMBULATORY_CARE_PROVIDER_SITE_OTHER): Payer: Medicare Other | Admitting: Otolaryngology

## 2014-05-28 DIAGNOSIS — J310 Chronic rhinitis: Secondary | ICD-10-CM | POA: Diagnosis not present

## 2014-06-25 DIAGNOSIS — I10 Essential (primary) hypertension: Secondary | ICD-10-CM | POA: Diagnosis not present

## 2014-06-25 DIAGNOSIS — E785 Hyperlipidemia, unspecified: Secondary | ICD-10-CM | POA: Diagnosis not present

## 2014-06-25 DIAGNOSIS — Z79899 Other long term (current) drug therapy: Secondary | ICD-10-CM | POA: Diagnosis not present

## 2014-06-25 DIAGNOSIS — E039 Hypothyroidism, unspecified: Secondary | ICD-10-CM | POA: Diagnosis not present

## 2014-07-02 DIAGNOSIS — E039 Hypothyroidism, unspecified: Secondary | ICD-10-CM | POA: Diagnosis not present

## 2014-07-02 DIAGNOSIS — Z23 Encounter for immunization: Secondary | ICD-10-CM | POA: Diagnosis not present

## 2014-07-02 DIAGNOSIS — Z0000 Encounter for general adult medical examination without abnormal findings: Secondary | ICD-10-CM | POA: Diagnosis not present

## 2014-07-02 DIAGNOSIS — E785 Hyperlipidemia, unspecified: Secondary | ICD-10-CM | POA: Diagnosis not present

## 2014-07-02 DIAGNOSIS — I2510 Atherosclerotic heart disease of native coronary artery without angina pectoris: Secondary | ICD-10-CM | POA: Diagnosis not present

## 2014-07-09 ENCOUNTER — Other Ambulatory Visit (HOSPITAL_COMMUNITY): Payer: Self-pay | Admitting: Internal Medicine

## 2014-07-09 DIAGNOSIS — I6523 Occlusion and stenosis of bilateral carotid arteries: Secondary | ICD-10-CM

## 2014-07-29 ENCOUNTER — Ambulatory Visit (HOSPITAL_COMMUNITY)
Admission: RE | Admit: 2014-07-29 | Discharge: 2014-07-29 | Disposition: A | Discharge status: Home / Self Care | Payer: Medicare Other | Source: Ambulatory Visit | Attending: Internal Medicine | Admitting: Internal Medicine

## 2014-07-29 DIAGNOSIS — I10 Essential (primary) hypertension: Secondary | ICD-10-CM | POA: Diagnosis not present

## 2014-07-29 DIAGNOSIS — I658 Occlusion and stenosis of other precerebral arteries: Secondary | ICD-10-CM | POA: Diagnosis not present

## 2014-07-29 DIAGNOSIS — E785 Hyperlipidemia, unspecified: Secondary | ICD-10-CM | POA: Diagnosis not present

## 2014-07-29 DIAGNOSIS — I6529 Occlusion and stenosis of unspecified carotid artery: Secondary | ICD-10-CM | POA: Insufficient documentation

## 2014-07-29 DIAGNOSIS — I6523 Occlusion and stenosis of bilateral carotid arteries: Secondary | ICD-10-CM

## 2014-09-03 DIAGNOSIS — H524 Presbyopia: Secondary | ICD-10-CM | POA: Diagnosis not present

## 2014-09-03 DIAGNOSIS — H52223 Regular astigmatism, bilateral: Secondary | ICD-10-CM | POA: Diagnosis not present

## 2014-09-03 DIAGNOSIS — L718 Other rosacea: Secondary | ICD-10-CM | POA: Diagnosis not present

## 2014-09-03 DIAGNOSIS — H5202 Hypermetropia, left eye: Secondary | ICD-10-CM | POA: Diagnosis not present

## 2014-10-13 ENCOUNTER — Emergency Department (HOSPITAL_COMMUNITY): Payer: Medicare Other

## 2014-10-13 ENCOUNTER — Encounter (HOSPITAL_COMMUNITY): Payer: Self-pay | Admitting: *Deleted

## 2014-10-13 ENCOUNTER — Ambulatory Visit (HOSPITAL_COMMUNITY)
Admission: RE | Admit: 2014-10-13 | Discharge: 2014-10-13 | Disposition: A | Discharge status: Home / Self Care | Payer: Medicare Other | Source: Ambulatory Visit | Attending: Preventative Medicine | Admitting: Preventative Medicine

## 2014-10-13 ENCOUNTER — Other Ambulatory Visit (HOSPITAL_COMMUNITY): Payer: Self-pay | Admitting: Preventative Medicine

## 2014-10-13 ENCOUNTER — Other Ambulatory Visit: Payer: Self-pay

## 2014-10-13 ENCOUNTER — Inpatient Hospital Stay (HOSPITAL_COMMUNITY)
Admission: EM | Admit: 2014-10-13 | Discharge: 2014-10-15 | DRG: 066 | Disposition: A | Discharge status: Home / Self Care | Payer: Medicare Other | Attending: Internal Medicine | Admitting: Internal Medicine

## 2014-10-13 DIAGNOSIS — R200 Anesthesia of skin: Secondary | ICD-10-CM

## 2014-10-13 DIAGNOSIS — G319 Degenerative disease of nervous system, unspecified: Secondary | ICD-10-CM | POA: Insufficient documentation

## 2014-10-13 DIAGNOSIS — I639 Cerebral infarction, unspecified: Secondary | ICD-10-CM | POA: Diagnosis present

## 2014-10-13 DIAGNOSIS — K219 Gastro-esophageal reflux disease without esophagitis: Secondary | ICD-10-CM | POA: Diagnosis present

## 2014-10-13 DIAGNOSIS — R42 Dizziness and giddiness: Secondary | ICD-10-CM

## 2014-10-13 DIAGNOSIS — I10 Essential (primary) hypertension: Secondary | ICD-10-CM | POA: Diagnosis present

## 2014-10-13 DIAGNOSIS — E785 Hyperlipidemia, unspecified: Secondary | ICD-10-CM | POA: Diagnosis present

## 2014-10-13 DIAGNOSIS — I6350 Cerebral infarction due to unspecified occlusion or stenosis of unspecified cerebral artery: Secondary | ICD-10-CM

## 2014-10-13 DIAGNOSIS — I517 Cardiomegaly: Secondary | ICD-10-CM | POA: Diagnosis not present

## 2014-10-13 DIAGNOSIS — Z7982 Long term (current) use of aspirin: Secondary | ICD-10-CM | POA: Diagnosis not present

## 2014-10-13 DIAGNOSIS — E876 Hypokalemia: Secondary | ICD-10-CM | POA: Diagnosis present

## 2014-10-13 DIAGNOSIS — R7309 Other abnormal glucose: Secondary | ICD-10-CM | POA: Diagnosis present

## 2014-10-13 DIAGNOSIS — G464 Cerebellar stroke syndrome: Secondary | ICD-10-CM | POA: Diagnosis not present

## 2014-10-13 DIAGNOSIS — E780 Pure hypercholesterolemia: Secondary | ICD-10-CM | POA: Diagnosis present

## 2014-10-13 DIAGNOSIS — M1990 Unspecified osteoarthritis, unspecified site: Secondary | ICD-10-CM | POA: Diagnosis present

## 2014-10-13 DIAGNOSIS — I6523 Occlusion and stenosis of bilateral carotid arteries: Secondary | ICD-10-CM | POA: Diagnosis not present

## 2014-10-13 DIAGNOSIS — I6521 Occlusion and stenosis of right carotid artery: Secondary | ICD-10-CM | POA: Diagnosis not present

## 2014-10-13 DIAGNOSIS — E039 Hypothyroidism, unspecified: Secondary | ICD-10-CM | POA: Diagnosis present

## 2014-10-13 LAB — CBC WITH DIFFERENTIAL/PLATELET
Basophils Absolute: 0 10*3/uL (ref 0.0–0.1)
Basophils Relative: 0 % (ref 0–1)
Eosinophils Absolute: 0.1 10*3/uL (ref 0.0–0.7)
Eosinophils Relative: 1 % (ref 0–5)
HEMATOCRIT: 42.1 % (ref 36.0–46.0)
Hemoglobin: 14.4 g/dL (ref 12.0–15.0)
LYMPHS PCT: 17 % (ref 12–46)
Lymphs Abs: 1.8 10*3/uL (ref 0.7–4.0)
MCH: 30.4 pg (ref 26.0–34.0)
MCHC: 34.2 g/dL (ref 30.0–36.0)
MCV: 89 fL (ref 78.0–100.0)
MONO ABS: 0.7 10*3/uL (ref 0.1–1.0)
Monocytes Relative: 7 % (ref 3–12)
NEUTROS ABS: 8 10*3/uL — AB (ref 1.7–7.7)
Neutrophils Relative %: 75 % (ref 43–77)
PLATELETS: 251 10*3/uL (ref 150–400)
RBC: 4.73 MIL/uL (ref 3.87–5.11)
RDW: 13.6 % (ref 11.5–15.5)
WBC: 10.5 10*3/uL (ref 4.0–10.5)

## 2014-10-13 LAB — BASIC METABOLIC PANEL
Anion gap: 14 (ref 5–15)
BUN: 16 mg/dL (ref 6–23)
CO2: 29 meq/L (ref 19–32)
Calcium: 9.7 mg/dL (ref 8.4–10.5)
Chloride: 94 mEq/L — ABNORMAL LOW (ref 96–112)
Creatinine, Ser: 0.49 mg/dL — ABNORMAL LOW (ref 0.50–1.10)
GFR calc Af Amer: >90 mL/min (ref >90)
GFR calc non Af Amer: 90 mL/min — ABNORMAL LOW (ref >90)
GLUCOSE: 119 mg/dL — AB (ref 70–99)
POTASSIUM: 3.5 meq/L — AB (ref 3.7–5.3)
SODIUM: 137 meq/L (ref 137–147)

## 2014-10-13 MED ORDER — HEPARIN SODIUM (PORCINE) 5000 UNIT/ML IJ SOLN
5000.0000 [IU] | Freq: Three times a day (TID) | INTRAMUSCULAR | Status: DC
  Administered 2014-10-13 – 2014-10-15 (×5): 5000 [IU] via SUBCUTANEOUS
  Filled 2014-10-13 (×4): qty 1

## 2014-10-13 MED ORDER — CITALOPRAM HYDROBROMIDE 20 MG PO TABS
10.0000 mg | ORAL_TABLET | Freq: Every day | ORAL | Status: DC
  Administered 2014-10-14 – 2014-10-15 (×2): 10 mg via ORAL
  Filled 2014-10-13 (×2): qty 1

## 2014-10-13 MED ORDER — ASPIRIN 81 MG PO CHEW
324.0000 mg | CHEWABLE_TABLET | Freq: Every day | ORAL | Status: DC
  Administered 2014-10-14 – 2014-10-15 (×2): 324 mg via ORAL
  Filled 2014-10-13 (×2): qty 4

## 2014-10-13 MED ORDER — PANTOPRAZOLE SODIUM 40 MG PO TBEC
40.0000 mg | DELAYED_RELEASE_TABLET | Freq: Every day | ORAL | Status: DC
  Administered 2014-10-14 – 2014-10-15 (×2): 40 mg via ORAL
  Filled 2014-10-13 (×2): qty 1

## 2014-10-13 MED ORDER — ADULT MULTIVITAMIN W/MINERALS CH
1.0000 | ORAL_TABLET | Freq: Every day | ORAL | Status: DC
  Administered 2014-10-14 – 2014-10-15 (×2): 1 via ORAL
  Filled 2014-10-13 (×2): qty 1

## 2014-10-13 MED ORDER — ASPIRIN 81 MG PO CHEW: 81.0000 mg | CHEWABLE_TABLET | Freq: Every day | ORAL | Status: DC

## 2014-10-13 MED ORDER — HYDROCHLOROTHIAZIDE 25 MG PO TABS
12.5000 mg | ORAL_TABLET | Freq: Every day | ORAL | Status: DC
  Administered 2014-10-14 – 2014-10-15 (×2): 12.5 mg via ORAL
  Filled 2014-10-13 (×2): qty 1

## 2014-10-13 MED ORDER — ONDANSETRON HCL 4 MG/2ML IJ SOLN
4.0000 mg | Freq: Once | INTRAMUSCULAR | Status: AC
  Administered 2014-10-13: 4 mg via INTRAVENOUS
  Filled 2014-10-13: qty 2

## 2014-10-13 MED ORDER — SENNOSIDES-DOCUSATE SODIUM 8.6-50 MG PO TABS: 1.0000 | ORAL_TABLET | Freq: Every evening | ORAL | Status: DC | PRN

## 2014-10-13 MED ORDER — MECLIZINE HCL 12.5 MG PO TABS
25.0000 mg | ORAL_TABLET | Freq: Once | ORAL | Status: AC
Start: 2014-10-13 — End: 2014-10-13
  Administered 2014-10-13: 25 mg via ORAL
  Filled 2014-10-13: qty 2

## 2014-10-13 MED ORDER — STROKE: EARLY STAGES OF RECOVERY BOOK
Freq: Once | Status: AC
  Administered 2014-10-14: 12:00:00
  Filled 2014-10-13: qty 1

## 2014-10-13 MED ORDER — PRAVASTATIN SODIUM 10 MG PO TABS
10.0000 mg | ORAL_TABLET | Freq: Every day | ORAL | Status: DC
  Administered 2014-10-14: 10 mg via ORAL
  Filled 2014-10-13: qty 1

## 2014-10-13 MED ORDER — LEVOTHYROXINE SODIUM 25 MCG PO TABS
25.0000 ug | ORAL_TABLET | Freq: Every day | ORAL | Status: DC
  Administered 2014-10-14 – 2014-10-15 (×2): 25 ug via ORAL
  Filled 2014-10-13 (×2): qty 1

## 2014-10-13 MED ORDER — AMLODIPINE BESYLATE 5 MG PO TABS
2.5000 mg | ORAL_TABLET | Freq: Every day | ORAL | Status: DC
  Administered 2014-10-14 – 2014-10-15 (×2): 2.5 mg via ORAL
  Filled 2014-10-13 (×2): qty 1

## 2014-10-13 MED ORDER — POLYVINYL ALCOHOL 1.4 % OP SOLN
1.0000 [drp] | Freq: Four times a day (QID) | OPHTHALMIC | Status: DC
  Administered 2014-10-13 – 2014-10-15 (×6): 1 [drp] via OPHTHALMIC
  Filled 2014-10-13: qty 15

## 2014-10-13 MED ORDER — ESTRADIOL 1 MG PO TABS
0.5000 mg | ORAL_TABLET | Freq: Every day | ORAL | Status: DC
  Filled 2014-10-13: qty 0.5

## 2014-10-13 NOTE — ED Provider Notes (Addendum)
CSN: [id number]     Arrival date & time 10/13/14  1318 History  This chart was scribed for Tanna Furry, MD by Rayfield Citizen, ED Scribe. This patient was seen in room [facility] and the patient's care was started at 1:35 PM.    Chief Complaint  Patient presents with  . Dizziness   The history is provided by the patient. No language interpreter was used.    Last time known normal was bedtime, Monday 10-12-2010. Pt awakened with symptoms this morning.  HPI Comments: Sheila Briggs is a 80 y.o. female who presents to the Emergency Department complaining of dizziness. Patient reports that she felt fine yesterday, woke this morning around 0630 and had to hold on to sturdy items to ambulate - "couldn't get my feet down to take steps." She also reports nausea and an "odd, itchy" sensation in her ears. She denies prior experience with similar symptoms. She denies nausea, headaches, palpitations, appetite change or any other pain at this time. Patient visited a local Urgent Care around 1030 or 1100; her symptoms were improving at that time. She reports that her symptoms have gradually improved throughout the day and are largely resolved at present.   Patient reports that she has a blockage in her left carotid artery; she believes that the blockage is around 70% at this time. This is monitored regularly by Asencion Noble, MD.   Past Medical History  Diagnosis Date  . Thyroid disease   . Hypertension   . Hypercholesteremia   . GERD (gastroesophageal reflux disease)   . Hypothyroidism   . Anxiety   . Cancer     basal cell of scalp  . Arthritis   . HOH (hard of hearing)    Past Surgical History  Procedure Laterality Date  . Biopsy thyroid      benign  . Abdominal hysterectomy    . Cholecystectomy    . Appendectomy    . Tonsillectomy    . Cataract extraction w/phaco  12/19/2012    Procedure: CATARACT EXTRACTION PHACO AND INTRAOCULAR LENS PLACEMENT (IOC);  Surgeon: Tonny Branch, MD;  Location: [facility];   Service: Ophthalmology;  Laterality: Left;  CDE 13.75  . Cataract extraction w/phaco Right 01/06/2013    Procedure: CATARACT EXTRACTION PHACO AND INTRAOCULAR LENS PLACEMENT (IOC);  Surgeon: Tonny Branch, MD;  Location: [facility];  Service: Ophthalmology;  Laterality: Right;  CDE: 9.96   No family history on file. History  Substance Use Topics  . Smoking status: Never Smoker   . Smokeless tobacco: Not on file  . Alcohol Use: No   OB History    No data available     Review of Systems  Constitutional: Negative for fever, chills, diaphoresis, appetite change and fatigue.  HENT: Negative for mouth sores, sore throat and trouble swallowing.   Eyes: Negative for visual disturbance.  Respiratory: Negative for cough, chest tightness, shortness of breath and wheezing.   Cardiovascular: Negative for chest pain.  Gastrointestinal: Negative for nausea, vomiting, abdominal pain, diarrhea and abdominal distention.  Endocrine: Negative for polydipsia, polyphagia and polyuria.  Genitourinary: Negative for dysuria, frequency and hematuria.  Musculoskeletal: Negative for gait problem.  Skin: Negative for color change, pallor and rash.  Neurological: Positive for dizziness and light-headedness. Negative for syncope and headaches.  Hematological: Does not bruise/bleed easily.  Psychiatric/Behavioral: Negative for behavioral problems and confusion.      Allergies  Review of patient's allergies indicates no known allergies.  Home Medications   Prior to Admission medications  Medication Sig Start Date End Date Taking? Authorizing Provider  amLODipine (NORVASC) 2.5 MG tablet Take 2.5 mg by mouth daily.  02/07/11   Historical Provider, MD  aspirin 81 MG chewable tablet Chew 81 mg by mouth daily.    Historical Provider, MD  citalopram (CELEXA) 10 MG tablet Take 10 mg by mouth daily.    Historical Provider, MD  estradiol (ESTRACE) 0.5 MG tablet Take 0.5 mg by mouth daily.  03/20/11   Historical Provider, MD   hydrochlorothiazide (HYDRODIURIL) 12.5 MG tablet Take 12.5 mg by mouth daily.  03/20/11   Historical Provider, MD  Multiple Vitamin (MULTIVITAMIN WITH MINERALS) TABS Take 1 tablet by mouth daily.    Historical Provider, MD  naproxen sodium (ALEVE) 220 MG tablet Take 220 mg by mouth 2 (two) times daily as needed. Pain.    Historical Provider, MD  omeprazole (PRILOSEC) 20 MG capsule Take 20 mg by mouth daily.    Historical Provider, MD  Polyethyl Glycol-Propyl Glycol (SYSTANE ULTRA OP) Apply 1 drop to eye 4 (four) times daily.    Historical Provider, MD  pravastatin (PRAVACHOL) 10 MG tablet Take 10 mg by mouth daily.    Historical Provider, MD  SYNTHROID 25 MCG tablet Take 25 mcg by mouth daily.  02/07/11   Historical Provider, MD   BP 166/78 mmHg  Pulse 111  Temp(Src) 97.8 F (36.6 C) (Oral)  Resp 18  SpO2 95% Physical Exam  Constitutional: She is oriented to person, place, and time. She appears well-developed and well-nourished. No distress.  HENT:  Head: Normocephalic.  Eyes: Conjunctivae are normal. Pupils are equal, round, and reactive to light. No scleral icterus.  Neck: Normal range of motion. Neck supple. No thyromegaly present.  Cardiovascular: Normal rate and regular rhythm.  Exam reveals no gallop and no friction rub.   No murmur heard. Pulmonary/Chest: Effort normal and breath sounds normal. No respiratory distress. She has no wheezes. She has no rales.  Abdominal: Soft. Bowel sounds are normal. She exhibits no distension. There is no tenderness. There is no rebound.  Musculoskeletal: Normal range of motion.  Neurological: She is alert and oriented to person, place, and time.  Mild vertigo with lateral gaze on exam  Skin: Skin is warm and dry. No rash noted.  Psychiatric: She has a normal mood and affect. Her behavior is normal.    ED Course  Procedures   DIAGNOSTIC STUDIES: 1:38 PM Oxygen Saturation is 98% on RA, normal by my interpretation.    COORDINATION OF  CARE: 1:44 PM Discussed treatment plan with pt at bedside and pt agreed to plan.   Labs Review Labs Reviewed  BASIC METABOLIC PANEL - Abnormal; Notable for the following:    Potassium 3.5 (*)    Chloride 94 (*)    Glucose, Bld 119 (*)    Creatinine, Ser 0.49 (*)    GFR calc non Af Amer 90 (*)    All other components within normal limits  CBC WITH DIFFERENTIAL - Abnormal; Notable for the following:    Neutro Abs 8.0 (*)    All other components within normal limits    Imaging Review Ct Head Wo Contrast  10/13/2014   CLINICAL DATA:  Left-sided numbness; dizziness  EXAM: CT HEAD WITHOUT CONTRAST  TECHNIQUE: Contiguous axial images were obtained from the base of the skull through the vertex without intravenous contrast.  COMPARISON:  None.  FINDINGS: There is mild generalized atrophy. There is somewhat greater frontal lobe atrophy than elsewhere. There is no  intracranial mass, hemorrhage, extra-axial fluid collection, or midline shift. There is patchy small vessel disease throughout the centra semiovale bilaterally. There is a subtle area of decreased attenuation in the posterior limb of the right external capsule which may represent a small recent/acute infarct. There is no focal vascular distribution type acute infarct. Bony calvarium appears intact. The mastoid air cells are clear.  IMPRESSION: Atrophy with extensive supratentorial small vessel disease. One or more of these areas of small vessel disease in the centra semiovale potentially could be acute/recent. There is a small focal area of decreased attenuation in the posterior limb of the right external capsule which does have an appearance suggesting acute/recent infarct.  There is no hemorrhage or mass effect. No extra-axial fluid appreciable.   Electronically Signed   By: Lowella Grip M.D.   On: 10/13/2014 12:25   Mr Brain Wo Contrast  10/13/2014   CLINICAL DATA:  Vertigo. Dizziness with difficulty ambulating today.  EXAM: MRI HEAD  WITHOUT CONTRAST  TECHNIQUE: Multiplanar, multiecho pulse sequences of the brain and surrounding structures were obtained without intravenous contrast.  COMPARISON:  Head CT 10/13/2014  FINDINGS: There is a small, acute right lateral lenticulostriate artery territory infarct involving the posterior right corona radiata and posterior putamen. A focus of chronic microhemorrhage is noted in the left frontoparietal region just above the operculum. There is mild generalized cerebral atrophy. Patchy and confluent T2 hyperintensities throughout the subcortical and deep cerebral white matter are nonspecific but compatible with extensive, age advanced chronic small vessel ischemic disease. There is no evidence of mass, midline shift, or extra-axial fluid collection.  Prior bilateral cataract extraction is noted. Paranasal sinuses and mastoid air cells are clear. Major intracranial vascular flow voids are preserved.  IMPRESSION: 1. Small, acute right basal ganglia infarct. 2. Extensive chronic small vessel ischemic disease.   Electronically Signed   By: Logan Bores   On: 10/13/2014 15:17     EKG Interpretation None      MDM   Final diagnoses:  Vertigo  Cerebral infarction due to unspecified mechanism   Pt awakened with symptoms.  Out of window for acute lytic therapy.  Still symptomatic with vertigo. No additional localizing symptoms.  Will discuss admit with Hospitalist.      Tanna Furry, MD 10/13/14 [place], MD 10/13/14 [phone]

## 2014-10-13 NOTE — ED Notes (Signed)
Pt states she woke up with dizziness and nausea present. Pt went to [place]. And was sent here "to see if I was able to get a shot for a stroke" Pt is ambulating well, speech is clear, smile is symmetrical. Pt states symptoms are better than they were earlier. Pt states she thought it was vertigo, but has never been dx with it, stating her husband had vertigo. NAD

## 2014-10-13 NOTE — Consult Note (Signed)
Sheila A. Merlene Laughter, MD     [email or web address]          Sheila Briggs is an 80 y.o. female.   ASSESSMENT/PLAN: 1. Acute infarct involving the putamen on the right side. This is possibly a lacunar type infarct although extracranial and intracranial carotid disease are concerning given her previous carotid disease history. Risk factors hypertension and age. We will wait for the MRA of the intracranial vessels to come back before making further recommendations. For now, she should continue with the higher dose of aspirin.  2. Extensive chronic microvascular ischemic changes.  The patient is a 80 year old white female who woke up on yesterday morning and complained of dizziness. The dizziness is described as gait instability. She didn't attempt to walk and noticed that she had difficulties walking around. The patient does not report any spinning sensation. She was normal the previous night but had these symptoms on awakening at 6:30 AM. She does not report having this author, dysphagia, headaches, focal numbness or weakness. She did report having some associated nausea with these symptoms. The review of systems otherwise negative. There are no complaints of chest pain or shortness of breath.   GENERAL: This a pleasant thin female in no acute distress.  HEENT: Supple. Atraumatic normocephalic.   ABDOMEN: soft  EXTREMITIES: No edema   BACK: Normal.  SKIN: Normal by inspection.    MENTAL STATUS: Alert and oriented. Speech, language and cognition are generally intact. Judgment and insight normal.   CRANIAL NERVES: Pupils are equal, round and reactive to light and accommodation; extra ocular movements are full, there is no significant nystagmus; visual fields are full; upper and lower facial muscles are normal in strength and symmetric, there is no flattening of the nasolabial folds; tongue is midline; uvula is midline; shoulder elevation is normal.  MOTOR: Normal  tone, bulk and strength; no pronator drift.  COORDINATION: Right finger to nose normal. There is mild dysmetria of the left upper extremity and also some subtle dysmetria of the left leg. Right leg is normal. No tremors. No rigidity.  REFLEXES: Deep tendon reflexes are symmetrical and normal. Babinski reflexes are flexor bilaterally.   SENSATION: Normal to light touch.   The brain MRI is reviewed in person. There is increased signal seen on diffusion imaging involving the right putamen. It seems extends medially to the lateral ventricle. There is quite significant moderate confluent periventricular and deep white matter leukoencephalopathy indicative of chronic microvascular ischemic changes.    Blood pressure 166/64, pulse 102, temperature 99 F (37.2 C), temperature source Oral, resp. rate 20, height $RemoveBe'5\' 1"'cAgKAbset$  (1.549 m), weight 56.2 kg (123 lb 14.4 oz), SpO2 97 %.  Past Medical History  Diagnosis Date  . Thyroid disease   . Hypertension   . Hypercholesteremia   . GERD (gastroesophageal reflux disease)   . Hypothyroidism   . Anxiety   . Cancer     basal cell of scalp  . Arthritis   . HOH (hard of hearing)     Past Surgical History  Procedure Laterality Date  . Biopsy thyroid      benign  . Abdominal hysterectomy    . Cholecystectomy    . Appendectomy    . Tonsillectomy    . Cataract extraction w/phaco  12/19/2012    Procedure: CATARACT EXTRACTION PHACO AND INTRAOCULAR LENS PLACEMENT (IOC);  Surgeon: Tonny Branch, MD;  Location: [facility];  Service: Ophthalmology;  Laterality: Left;  CDE 13.75  . Cataract extraction w/phaco Right  01/06/2013    Procedure: CATARACT EXTRACTION PHACO AND INTRAOCULAR LENS PLACEMENT (IOC);  Surgeon: Tonny Branch, MD;  Location: [facility];  Service: Ophthalmology;  Laterality: Right;  CDE: 9.96    No family history on file.  Social History:  reports that she has never smoked. She does not have any smokeless tobacco history on file. She reports that she does not  drink alcohol or use illicit drugs.  Allergies: No Known Allergies  Medications: Prior to Admission medications   Medication Sig Start Date End Date Taking? Authorizing Provider  amLODipine (NORVASC) 2.5 MG tablet Take 2.5 mg by mouth daily.  02/07/11  Yes Historical Provider, MD  aspirin 81 MG chewable tablet Chew 81 mg by mouth daily.   Yes Historical Provider, MD  citalopram (CELEXA) 10 MG tablet Take 10 mg by mouth daily.   Yes Historical Provider, MD  estradiol (ESTRACE) 0.5 MG tablet Take 0.5 mg by mouth daily.  03/20/11  Yes Historical Provider, MD  hydrochlorothiazide (HYDRODIURIL) 12.5 MG tablet Take 12.5 mg by mouth daily.  03/20/11  Yes Historical Provider, MD  Multiple Vitamin (MULTIVITAMIN WITH MINERALS) TABS Take 1 tablet by mouth daily.   Yes Historical Provider, MD  naproxen sodium (ALEVE) 220 MG tablet Take 220 mg by mouth 2 (two) times daily as needed. Pain.   Yes Historical Provider, MD  Polyethyl Glycol-Propyl Glycol (SYSTANE ULTRA OP) Apply 1 drop to eye 4 (four) times daily.   Yes Historical Provider, MD  pravastatin (PRAVACHOL) 10 MG tablet Take 10 mg by mouth daily.   Yes Historical Provider, MD  SYNTHROID 25 MCG tablet Take 25 mcg by mouth daily.  02/07/11  Yes Historical Provider, MD    Scheduled Meds: .  stroke: mapping our early stages of recovery book   Does not apply Once  . amLODipine  2.5 mg Oral Daily  . aspirin  324 mg Oral Daily  . citalopram  10 mg Oral Daily  . [START ON 10/14/2014] estradiol  0.5 mg Oral Daily  . heparin  5,000 Units Subcutaneous 3 times per day  . hydrochlorothiazide  12.5 mg Oral Daily  . [START ON 10/14/2014] levothyroxine  25 mcg Oral QAC breakfast  . [START ON 10/14/2014] multivitamin with minerals  1 tablet Oral Daily  . pantoprazole  40 mg Oral Daily  . polyvinyl alcohol  1 drop Both Eyes QID  . pravastatin  10 mg Oral q1800   Continuous Infusions:  PRN Meds:.senna-docusate     Results for orders placed or performed during  the hospital encounter of 10/13/14 (from the past 48 hour(s))  Basic metabolic panel     Status: Abnormal   Collection Time: 10/13/14  2:04 PM  Result Value Ref Range   Sodium 137 137 - 147 mEq/L   Potassium 3.5 (L) 3.7 - 5.3 mEq/L   Chloride 94 (L) 96 - 112 mEq/L   CO2 29 19 - 32 mEq/L   Glucose, Bld 119 (H) 70 - 99 mg/dL   BUN 16 6 - 23 mg/dL   Creatinine, Ser 0.49 (L) 0.50 - 1.10 mg/dL   Calcium 9.7 8.4 - 10.5 mg/dL   GFR calc non Af Amer 90 (L) >90 mL/min   GFR calc Af Amer >90 >90 mL/min    Comment: (NOTE) The eGFR has been calculated using the CKD EPI equation. This calculation has not been validated in all clinical situations. eGFR's persistently <90 mL/min signify possible Chronic Kidney Disease.    Anion gap 14 5 - 15  CBC with Differential     Status: Abnormal   Collection Time: 10/13/14  2:04 PM  Result Value Ref Range   WBC 10.5 4.0 - 10.5 K/uL   RBC 4.73 3.87 - 5.11 MIL/uL   Hemoglobin 14.4 12.0 - 15.0 g/dL   HCT 42.1 36.0 - 46.0 %   MCV 89.0 78.0 - 100.0 fL   MCH 30.4 26.0 - 34.0 pg   MCHC 34.2 30.0 - 36.0 g/dL   RDW 13.6 11.5 - 15.5 %   Platelets 251 150 - 400 K/uL   Neutrophils Relative % 75 43 - 77 %   Neutro Abs 8.0 (H) 1.7 - 7.7 K/uL   Lymphocytes Relative 17 12 - 46 %   Lymphs Abs 1.8 0.7 - 4.0 K/uL   Monocytes Relative 7 3 - 12 %   Monocytes Absolute 0.7 0.1 - 1.0 K/uL   Eosinophils Relative 1 0 - 5 %   Eosinophils Absolute 0.1 0.0 - 0.7 K/uL   Basophils Relative 0 0 - 1 %   Basophils Absolute 0.0 0.0 - 0.1 K/uL    Studies/Results:  BRAIN MRI There is a small, acute right lateral lenticulostriate artery territory infarct involving the posterior right corona radiata and posterior putamen. A focus of chronic microhemorrhage is noted in the left frontoparietal region just above the operculum. There is mild generalized cerebral atrophy. Patchy and confluent T2 hyperintensities throughout the subcortical and deep cerebral white matter are  nonspecific but compatible with extensive, age advanced chronic small vessel ischemic disease. There is no evidence of mass, midline shift, or extra-axial fluid collection.  Prior bilateral cataract extraction is noted. Paranasal sinuses and mastoid air cells are clear. Major intracranial vascular flow voids are preserved.  IMPRESSION: 1. Small, acute right basal ganglia infarct. 2. Extensive chronic small vessel ischemic disease.   CAROTID DOPPLER 07-2014 RIGHT  ICA: 176/40 cm/sec  CCA: 44/9 cm/sec  SYSTOLIC ICA/CCA RATIO: 7.53  DIASTOLIC ICA/CCA RATIO: 0.05  ECA: 166 cm/sec  LEFT  ICA: 74/21 cm/sec  CCA: 11/02 cm/sec  SYSTOLIC ICA/CCA RATIO: 1.11  DIASTOLIC ICA/CCA RATIO: 7.35  IMPRESSION: 1. Suspected progression of right-sided atherosclerotic plaque results in elevated peak systolic velocities compatible with the mid to high end of the 50-69% luminal narrowing range. 2. Grossly unchanged minimal amount of left-sided atherosclerotic plaque not resulting in a hemodynamically significant stenosis.    Danett Palazzo A. Merlene Briggs, M.D.  Diplomate, Tax adviser of Psychiatry and Neurology ( Neurology). 10/13/2014, 8:58 PM

## 2014-10-13 NOTE — H&P (Signed)
Sheila Briggs History and Physical  [name] JSE:[id number] DOB: Sep 01, 1932 DOA: 10/13/2014  Referring physician: ER PCP: Asencion Noble, MD   Chief Complaint: Dizziness  HPI: Sheila Briggs is a 80 y.o. female  This is an 80 year old delightful lady who at 6:30 this morning woke up and became suddenly dizzy and had difficulty walking because of her dizziness. She denied any slurred speech, headache, fall or loss of consciousness. Since this time, her symptoms of been improving somewhat. Evaluation in the emergency room with MRI brain scan shows her to have a new acute, small right basal ganglia infarct. She is hypertensive. She is now being made for further management.   Review of Systems:  Constitutional:  No weight loss, night sweats, Fevers, chills, fatigue.  HEENT:  No headaches, Difficulty swallowing,Tooth/dental problems,Sore throat,  No sneezing, itching, ear ache, nasal congestion, post nasal drip,  Cardio-vascular:  No chest pain, Orthopnea, PND, swelling in lower extremities, anasarca,  palpitations  GI:  No heartburn, indigestion, abdominal pain, nausea, vomiting, diarrhea, change in bowel habits, loss of appetite  Resp:  No shortness of breath with exertion or at rest. No excess mucus, no productive cough, No non-productive cough, No coughing up of blood.No change in color of mucus.No wheezing.No chest wall deformity  Skin:  no rash or lesions.  GU:  no dysuria, change in color of urine, no urgency or frequency. No flank pain.  Musculoskeletal:  No joint pain or swelling. No decreased range of motion. No back pain.  Psych:  No change in mood or affect. No depression or anxiety. No memory loss.   Past Medical History  Diagnosis Date  . Thyroid disease   . Hypertension   . Hypercholesteremia   . GERD (gastroesophageal reflux disease)   . Hypothyroidism   . Anxiety   . Cancer     basal cell of scalp  . Arthritis   . HOH (hard of hearing)    Past Surgical  History  Procedure Laterality Date  . Biopsy thyroid      benign  . Abdominal hysterectomy    . Cholecystectomy    . Appendectomy    . Tonsillectomy    . Cataract extraction w/phaco  12/19/2012    Procedure: CATARACT EXTRACTION PHACO AND INTRAOCULAR LENS PLACEMENT (IOC);  Surgeon: Tonny Branch, MD;  Location: [facility];  Service: Ophthalmology;  Laterality: Left;  CDE 13.75  . Cataract extraction w/phaco Right 01/06/2013    Procedure: CATARACT EXTRACTION PHACO AND INTRAOCULAR LENS PLACEMENT (IOC);  Surgeon: Tonny Branch, MD;  Location: [facility];  Service: Ophthalmology;  Laterality: Right;  CDE: 9.96   Social History:  reports that she has never smoked. She does not have any smokeless tobacco history on file. She reports that she does not drink alcohol or use illicit drugs.  No Known Allergies  No family history on file.   Prior to Admission medications   Medication Sig Start Date End Date Taking? Authorizing Provider  amLODipine (NORVASC) 2.5 MG tablet Take 2.5 mg by mouth daily.  02/07/11   Historical Provider, MD  aspirin 81 MG chewable tablet Chew 81 mg by mouth daily.    Historical Provider, MD  citalopram (CELEXA) 10 MG tablet Take 10 mg by mouth daily.    Historical Provider, MD  estradiol (ESTRACE) 0.5 MG tablet Take 0.5 mg by mouth daily.  03/20/11   Historical Provider, MD  hydrochlorothiazide (HYDRODIURIL) 12.5 MG tablet Take 12.5 mg by mouth daily.  03/20/11   Historical Provider, MD  Multiple Vitamin (MULTIVITAMIN WITH MINERALS) TABS Take 1 tablet by mouth daily.    Historical Provider, MD  naproxen sodium (ALEVE) 220 MG tablet Take 220 mg by mouth 2 (two) times daily as needed. Pain.    Historical Provider, MD  omeprazole (PRILOSEC) 20 MG capsule Take 20 mg by mouth daily.    Historical Provider, MD  Polyethyl Glycol-Propyl Glycol (SYSTANE ULTRA OP) Apply 1 drop to eye 4 (four) times daily.    Historical Provider, MD  pravastatin (PRAVACHOL) 10 MG tablet Take 10 mg by mouth daily.     Historical Provider, MD  SYNTHROID 25 MCG tablet Take 25 mcg by mouth daily.  02/07/11   Historical Provider, MD   Physical Exam: Filed Vitals:   10/13/14 1346 10/13/14 1525 10/13/14 1530 10/13/14 1613  BP: 186/85 166/78 139/105 168/92  Pulse: 113 111 120 113  Temp:  97.8 F (36.6 C)    TempSrc:  Oral    Resp: 14 18  18   SpO2: 99% 95%  93%    Wt Readings from Last 3 Encounters:  10/13/14 56.7 kg (125 lb)  03/03/14 56.7 kg (125 lb)  01/29/14 56.7 kg (125 lb)    General:  Appears calm and comfortable Eyes: PERRL, normal lids, irises & conjunctiva ENT: grossly normal hearing, lips & tongue Neck: no LAD, masses or thyromegaly Cardiovascular: RRR, no m/r/g. No LE edema. Telemetry: SR, no arrhythmias  Respiratory: CTA bilaterally, no w/r/r. Normal respiratory effort. Abdomen: soft, ntnd Skin: no rash or induration seen on limited exam Musculoskeletal: grossly normal tone BUE/BLE Psychiatric: grossly normal mood and affect, speech fluent and appropriate Neurologic: grossly non-focal. in particular, she does not appear to have any significant cerebellar signs. There is no gross weakness of any limb. Speech is normal.           Labs on Admission:  Basic Metabolic Panel:  Recent Labs Lab 10/13/14 1404  NA 137  K 3.5*  CL 94*  CO2 29  GLUCOSE 119*  BUN 16  CREATININE 0.49*  CALCIUM 9.7   Liver Function Tests: No results for input(s): AST, ALT, ALKPHOS, BILITOT, PROT, ALBUMIN in the last 168 hours. No results for input(s): LIPASE, AMYLASE in the last 168 hours. No results for input(s): AMMONIA in the last 168 hours. CBC:  Recent Labs Lab 10/13/14 1404  WBC 10.5  NEUTROABS 8.0*  HGB 14.4  HCT 42.1  MCV 89.0  PLT 251   Cardiac Enzymes: No results for input(s): CKTOTAL, CKMB, CKMBINDEX, TROPONINI in the last 168 hours.  BNP (last 3 results) No results for input(s): PROBNP in the last 8760 hours. CBG: No results for input(s): GLUCAP in the last 168  hours.  Radiological Exams on Admission: Ct Head Wo Contrast  10/13/2014   CLINICAL DATA:  Left-sided numbness; dizziness  EXAM: CT HEAD WITHOUT CONTRAST  TECHNIQUE: Contiguous axial images were obtained from the base of the skull through the vertex without intravenous contrast.  COMPARISON:  None.  FINDINGS: There is mild generalized atrophy. There is somewhat greater frontal lobe atrophy than elsewhere. There is no intracranial mass, hemorrhage, extra-axial fluid collection, or midline shift. There is patchy small vessel disease throughout the centra semiovale bilaterally. There is a subtle area of decreased attenuation in the posterior limb of the right external capsule which may represent a small recent/acute infarct. There is no focal vascular distribution type acute infarct. Bony calvarium appears intact. The mastoid air cells are clear.  IMPRESSION: Atrophy with extensive supratentorial small vessel disease.  One or more of these areas of small vessel disease in the centra semiovale potentially could be acute/recent. There is a small focal area of decreased attenuation in the posterior limb of the right external capsule which does have an appearance suggesting acute/recent infarct.  There is no hemorrhage or mass effect. No extra-axial fluid appreciable.   Electronically Signed   By: Lowella Grip M.D.   On: 10/13/2014 12:25   Mr Brain Wo Contrast  10/13/2014   CLINICAL DATA:  Vertigo. Dizziness with difficulty ambulating today.  EXAM: MRI HEAD WITHOUT CONTRAST  TECHNIQUE: Multiplanar, multiecho pulse sequences of the brain and surrounding structures were obtained without intravenous contrast.  COMPARISON:  Head CT 10/13/2014  FINDINGS: There is a small, acute right lateral lenticulostriate artery territory infarct involving the posterior right corona radiata and posterior putamen. A focus of chronic microhemorrhage is noted in the left frontoparietal region just above the operculum. There is mild  generalized cerebral atrophy. Patchy and confluent T2 hyperintensities throughout the subcortical and deep cerebral white matter are nonspecific but compatible with extensive, age advanced chronic small vessel ischemic disease. There is no evidence of mass, midline shift, or extra-axial fluid collection.  Prior bilateral cataract extraction is noted. Paranasal sinuses and mastoid air cells are clear. Major intracranial vascular flow voids are preserved.  IMPRESSION: 1. Small, acute right basal ganglia infarct. 2. Extensive chronic small vessel ischemic disease.   Electronically Signed   By: Logan Bores   On: 10/13/2014 15:17    ECG: Normal sinus tachycardia without any acute ST-T wave changes. Assessment/Plan   1. Acute, small right basal ganglia CVA. 2. Hypertension. 3. Hypothyroidism. 4. History of carotid stenosis, previously noncritical.  Plan: 1. Admit to telemetry. 2. Stroke workup. 3. Neurology consultation.  Further recommendations will depend on patient's hospital progress.  Code Status: Full code  DVT Prophylaxis: Heparin.  Family Communication: I discussed the plan with the patient at the bedside.   Disposition Plan: Home when medically stable.   Time spent: 60 minutes.  Doree Albee Sheila Briggs Pager [phone].

## 2014-10-14 ENCOUNTER — Inpatient Hospital Stay (HOSPITAL_COMMUNITY): Payer: Medicare Other

## 2014-10-14 DIAGNOSIS — I10 Essential (primary) hypertension: Secondary | ICD-10-CM | POA: Diagnosis not present

## 2014-10-14 DIAGNOSIS — R42 Dizziness and giddiness: Secondary | ICD-10-CM | POA: Diagnosis not present

## 2014-10-14 DIAGNOSIS — I639 Cerebral infarction, unspecified: Secondary | ICD-10-CM | POA: Diagnosis not present

## 2014-10-14 DIAGNOSIS — I517 Cardiomegaly: Secondary | ICD-10-CM

## 2014-10-14 LAB — LIPID PANEL
Cholesterol: 231 mg/dL — ABNORMAL HIGH (ref 0–200)
HDL: 77 mg/dL (ref >39)
LDL CALC: 135 mg/dL — AB (ref 0–99)
TRIGLYCERIDES: 97 mg/dL (ref <150)
Total CHOL/HDL Ratio: 3 RATIO
VLDL: 19 mg/dL (ref 0–40)

## 2014-10-14 LAB — HEMOGLOBIN A1C
Hgb A1c MFr Bld: 6.1 % — ABNORMAL HIGH (ref <5.7)
Mean Plasma Glucose: 128 mg/dL — ABNORMAL HIGH (ref <117)

## 2014-10-14 MED ORDER — POTASSIUM CHLORIDE CRYS ER 20 MEQ PO TBCR
20.0000 meq | EXTENDED_RELEASE_TABLET | Freq: Three times a day (TID) | ORAL | Status: DC
  Administered 2014-10-14 – 2014-10-15 (×4): 20 meq via ORAL
  Filled 2014-10-14 (×4): qty 1

## 2014-10-14 NOTE — Care Management Utilization Note (Signed)
UR complete 

## 2014-10-14 NOTE — Care Management Note (Addendum)
    Page 1 of 1   10/15/2014     8:56:56 AM CARE MANAGEMENT NOTE 10/15/2014  Patient:  Sheila Briggs, Sheila Briggs   Account Number:  [id number]  Date Initiated:  10/14/2014  Documentation initiated by:  CHILDRESS,JESSICA  Subjective/Objective Assessment:   Pt is from home with self care. Pt has no DME's, HH services or medication needs prior to admission.     Action/Plan:   Plan to return home with self care. No CM needs at this time. Will continue to follow for CM needs.   Anticipated DC Date:  10/16/2014   Anticipated DC Plan:  [facility]  CM consult      Choice offered to / List presented to:             Status of service:  Completed, signed off Medicare Important Message given?  NA - LOS <3 / Initial given by admissions (If response is "NO", the following Medicare IM given date fields will be blank) Date Medicare IM given:   Medicare IM given by:   Date Additional Medicare IM given:   Additional Medicare IM given by:    Discharge Disposition:  HOME/SELF CARE  Per UR Regulation:    If discussed at Long Length of Stay Meetings, dates discussed:    Comments:   10/15/14 0855 Christinia Gully, RN BSN CM Pt discharged home today. No CM needs noted. Pt stated that she has a walk in shower with seat and elevated toilet. Pt denies any need for ambulating assistive devices. 10/14/2014 [place], RN, MSN, [facility]

## 2014-10-14 NOTE — Plan of Care (Signed)
Problem: Acute Treatment Outcomes Goal: Neuro exam at baseline or improved Outcome: Completed/Met Date Met:  10/14/14 Goal: BP within ordered parameters Outcome: Completed/Met Date Met:  10/14/14 Goal: Airway maintained/protected Outcome: Completed/Met Date Met:  10/14/14 Goal: 02 Sats > 94% Outcome: Completed/Met Date Met:  10/14/14 Goal: Hemodynamically stable Outcome: Completed/Met Date Met:  10/14/14 Goal: tPA Patient w/o S&S of bleeding Outcome: Not Applicable Date Met:  33/58/25 Goal: Prognosis discussed with family/patient as appropriate Outcome: Completed/Met Date Met:  10/14/14 Goal: Other Acute Treatment Outcomes Outcome: Completed/Met Date Met:  10/14/14 Potassium 3.5 plan is to treat with potassium PO

## 2014-10-14 NOTE — Progress Notes (Signed)
Subjective: Sheila Briggs was admitted yesterday with a right basal ganglia infarct after presenting with dizziness. She feels better this morning. She has not had loss of consciousness, headache or slurred speech. She has previously been on antihypertensive therapy statin and aspirin.  Objective: Vital signs in last 24 hours: Filed Vitals:   10/13/14 2330 10/14/14 0130 10/14/14 0330 10/14/14 0430  BP: 152/63 148/60 144/53 113/75  Pulse: 93 82 78 90  Temp: 98.8 F (37.1 C) 98.8 F (37.1 C) 98.4 F (36.9 C) 98.4 F (36.9 C)  TempSrc: Oral Oral Oral Oral  Resp: 18 17 17 17   Height:      Weight:      SpO2: 95% 94% 93% 98%   Weight change:   Intake/Output Summary (Last 24 hours) at 10/14/14 0756 Last data filed at 10/14/14 0100  Gross per 24 hour  Intake     60 ml  Output   1650 ml  Net  -1590 ml    Physical Exam: Alert. No distress. Speech intact. Face symmetric. Lungs clear. Heart regular with no murmurs. Abdomen soft and nontender with no hepatosplenomegaly. Extremities reveal no edema. Neuro exam reveals slight weakness in the left lower leg. Grip strengths are symmetrical.  Lab Results:    Results for orders placed or performed during the hospital encounter of 10/13/14 (from the past 24 hour(s))  Basic metabolic panel     Status: Abnormal   Collection Time: 10/13/14  2:04 PM  Result Value Ref Range   Sodium 137 137 - 147 mEq/L   Potassium 3.5 (L) 3.7 - 5.3 mEq/L   Chloride 94 (L) 96 - 112 mEq/L   CO2 29 19 - 32 mEq/L   Glucose, Bld 119 (H) 70 - 99 mg/dL   BUN 16 6 - 23 mg/dL   Creatinine, Ser 0.49 (L) 0.50 - 1.10 mg/dL   Calcium 9.7 8.4 - 10.5 mg/dL   GFR calc non Af Amer 90 (L) >90 mL/min   GFR calc Af Amer >90 >90 mL/min   Anion gap 14 5 - 15  CBC with Differential     Status: Abnormal   Collection Time: 10/13/14  2:04 PM  Result Value Ref Range   WBC 10.5 4.0 - 10.5 K/uL   RBC 4.73 3.87 - 5.11 MIL/uL   Hemoglobin 14.4 12.0 - 15.0 g/dL   HCT 42.1 36.0 - 46.0 %    MCV 89.0 78.0 - 100.0 fL   MCH 30.4 26.0 - 34.0 pg   MCHC 34.2 30.0 - 36.0 g/dL   RDW 13.6 11.5 - 15.5 %   Platelets 251 150 - 400 K/uL   Neutrophils Relative % 75 43 - 77 %   Neutro Abs 8.0 (H) 1.7 - 7.7 K/uL   Lymphocytes Relative 17 12 - 46 %   Lymphs Abs 1.8 0.7 - 4.0 K/uL   Monocytes Relative 7 3 - 12 %   Monocytes Absolute 0.7 0.1 - 1.0 K/uL   Eosinophils Relative 1 0 - 5 %   Eosinophils Absolute 0.1 0.0 - 0.7 K/uL   Basophils Relative 0 0 - 1 %   Basophils Absolute 0.0 0.0 - 0.1 K/uL  Lipid panel     Status: Abnormal   Collection Time: 10/14/14  6:01 AM  Result Value Ref Range   Cholesterol 231 (H) 0 - 200 mg/dL   Triglycerides 97 <150 mg/dL   HDL 77 >39 mg/dL   Total CHOL/HDL Ratio 3.0 RATIO   VLDL 19 0 - 40 mg/dL  LDL Cholesterol 135 (H) 0 - 99 mg/dL     ABGS No results for input(s): PHART, PO2ART, TCO2, HCO3 in the last 72 hours.  Invalid input(s): PCO2 CULTURES No results found for this or any previous visit (from the past 240 hour(s)). Studies/Results: Ct Head Wo Contrast  10/13/2014   CLINICAL DATA:  Left-sided numbness; dizziness  EXAM: CT HEAD WITHOUT CONTRAST  TECHNIQUE: Contiguous axial images were obtained from the base of the skull through the vertex without intravenous contrast.  COMPARISON:  None.  FINDINGS: There is mild generalized atrophy. There is somewhat greater frontal lobe atrophy than elsewhere. There is no intracranial mass, hemorrhage, extra-axial fluid collection, or midline shift. There is patchy small vessel disease throughout the centra semiovale bilaterally. There is a subtle area of decreased attenuation in the posterior limb of the right external capsule which may represent a small recent/acute infarct. There is no focal vascular distribution type acute infarct. Bony calvarium appears intact. The mastoid air cells are clear.  IMPRESSION: Atrophy with extensive supratentorial small vessel disease. One or more of these areas of small vessel  disease in the centra semiovale potentially could be acute/recent. There is a small focal area of decreased attenuation in the posterior limb of the right external capsule which does have an appearance suggesting acute/recent infarct.  There is no hemorrhage or mass effect. No extra-axial fluid appreciable.   Electronically Signed   By: Lowella Grip M.D.   On: 10/13/2014 12:25   Mr Brain Wo Contrast  10/13/2014   CLINICAL DATA:  Vertigo. Dizziness with difficulty ambulating today.  EXAM: MRI HEAD WITHOUT CONTRAST  TECHNIQUE: Multiplanar, multiecho pulse sequences of the brain and surrounding structures were obtained without intravenous contrast.  COMPARISON:  Head CT 10/13/2014  FINDINGS: There is a small, acute right lateral lenticulostriate artery territory infarct involving the posterior right corona radiata and posterior putamen. A focus of chronic microhemorrhage is noted in the left frontoparietal region just above the operculum. There is mild generalized cerebral atrophy. Patchy and confluent T2 hyperintensities throughout the subcortical and deep cerebral white matter are nonspecific but compatible with extensive, age advanced chronic small vessel ischemic disease. There is no evidence of mass, midline shift, or extra-axial fluid collection.  Prior bilateral cataract extraction is noted. Paranasal sinuses and mastoid air cells are clear. Major intracranial vascular flow voids are preserved.  IMPRESSION: 1. Small, acute right basal ganglia infarct. 2. Extensive chronic small vessel ischemic disease.   Electronically Signed   By: Logan Bores   On: 10/13/2014 15:17   Micro Results: No results found for this or any previous visit (from the past 240 hour(s)). Studies/Results: Ct Head Wo Contrast  10/13/2014   CLINICAL DATA:  Left-sided numbness; dizziness  EXAM: CT HEAD WITHOUT CONTRAST  TECHNIQUE: Contiguous axial images were obtained from the base of the skull through the vertex without  intravenous contrast.  COMPARISON:  None.  FINDINGS: There is mild generalized atrophy. There is somewhat greater frontal lobe atrophy than elsewhere. There is no intracranial mass, hemorrhage, extra-axial fluid collection, or midline shift. There is patchy small vessel disease throughout the centra semiovale bilaterally. There is a subtle area of decreased attenuation in the posterior limb of the right external capsule which may represent a small recent/acute infarct. There is no focal vascular distribution type acute infarct. Bony calvarium appears intact. The mastoid air cells are clear.  IMPRESSION: Atrophy with extensive supratentorial small vessel disease. One or more of these areas of small vessel disease  in the centra semiovale potentially could be acute/recent. There is a small focal area of decreased attenuation in the posterior limb of the right external capsule which does have an appearance suggesting acute/recent infarct.  There is no hemorrhage or mass effect. No extra-axial fluid appreciable.   Electronically Signed   By: Lowella Grip M.D.   On: 10/13/2014 12:25   Mr Brain Wo Contrast  10/13/2014   CLINICAL DATA:  Vertigo. Dizziness with difficulty ambulating today.  EXAM: MRI HEAD WITHOUT CONTRAST  TECHNIQUE: Multiplanar, multiecho pulse sequences of the brain and surrounding structures were obtained without intravenous contrast.  COMPARISON:  Head CT 10/13/2014  FINDINGS: There is a small, acute right lateral lenticulostriate artery territory infarct involving the posterior right corona radiata and posterior putamen. A focus of chronic microhemorrhage is noted in the left frontoparietal region just above the operculum. There is mild generalized cerebral atrophy. Patchy and confluent T2 hyperintensities throughout the subcortical and deep cerebral white matter are nonspecific but compatible with extensive, age advanced chronic small vessel ischemic disease. There is no evidence of mass,  midline shift, or extra-axial fluid collection.  Prior bilateral cataract extraction is noted. Paranasal sinuses and mastoid air cells are clear. Major intracranial vascular flow voids are preserved.  IMPRESSION: 1. Small, acute right basal ganglia infarct. 2. Extensive chronic small vessel ischemic disease.   Electronically Signed   By: Logan Bores   On: 10/13/2014 15:17   Medications:  I have reviewed the patient's current medications Scheduled Meds: .  stroke: mapping our early stages of recovery book   Does not apply Once  . amLODipine  2.5 mg Oral Daily  . aspirin  324 mg Oral Daily  . citalopram  10 mg Oral Daily  . heparin  5,000 Units Subcutaneous 3 times per day  . hydrochlorothiazide  12.5 mg Oral Daily  . levothyroxine  25 mcg Oral QAC breakfast  . multivitamin with minerals  1 tablet Oral Daily  . pantoprazole  40 mg Oral Daily  . polyvinyl alcohol  1 drop Both Eyes QID  . potassium chloride  20 mEq Oral TID  . pravastatin  10 mg Oral q1800   Continuous Infusions:  PRN Meds:.senna-docusate   Assessment/Plan: #1. Right basal ganglia stroke. Continue aspirin. We will likely modified to a higher intensity statin although she has had some difficulty with side effects in the past. Continue antihypertensive therapy. I believe she has also had side effects from Ace inhibitors in the past. Echo pending. MRA pending. She has a history of nonobstructive carotid disease which will be rechecked. #2. Hypokalemia. 3.5. Supplement orally. Active Problems:   Hypertension   Hypothyroid   CVA (cerebral infarction)     LOS: 1 day   Rubbie Goostree 10/14/2014, 7:56 AM

## 2014-10-14 NOTE — Progress Notes (Signed)
OT Cancellation Note  Patient Details Name: Sheila Briggs MRN: [id number] DOB: [birth year]   Cancelled Treatment:    Reason Eval/Treat Not Completed: OT screened, no needs identified, will sign off. Pt has WFL strength, ROM, and sensation in BUE.  Pt has no concerns about d/c home and demonstrates good skills with functional tasks. Pt needs no further acute OT services at this time and no OT follow up.  Bea Graff Ayala Ribble, MS, OTR/L [facility] [phone] 10/14/2014, 9:55 AM

## 2014-10-14 NOTE — Evaluation (Signed)
Physical Therapy Evaluation Patient Details Name: Sheila Briggs MRN: [id number] DOB: November 29, 1931 Today's Date: 10/14/2014   History of Present Illness  This is an 80 year old delightful lady who at 6:30 this morning woke up and became suddenly dizzy and had difficulty walking because of her dizziness. She denied any slurred speech, headache, fall or loss of consciousness. Since this time, her symptoms of been improving somewhat. Evaluation in the emergency room with MRI brain scan shows her to have a new acute, small right basal ganglia infarct. She is hypertensive. She is now being made for further management.  Clinical Impression  Pt is an 80 year old female who presents to PT with dx of CVA.  Pt reports symptoms began with dizziness, though at this time dizziness has resolves.  Slight decrease in response speed to the Rt with head thrust test, though overall (-) VOR.  During evaluation, pt was (I) with bed mobility skills, transfers, and ambulation without use of AD.  Pt was able to ascend stairs with 1 handrail and step through pattern, and descend stairs with 1 handrail and step to pattern; pt reports this is her normal stair pattern.  DGI performed with score of 23/24, with only mild change noted in gait speed with horizontal head turns, though no LOB or corrective steps during gait for balance.  No complaints of dizziness throughout PT evaluation.  Pt appears at baseline level of function, and no follow up PT recommended.  No DME recommendations.  Pt will be d/c from acute PT services.     Follow Up Recommendations No PT follow up    Equipment Recommendations  None recommended by PT       Precautions / Restrictions Precautions Precautions: Fall Restrictions Weight Bearing Restrictions: No      Mobility  Bed Mobility Overal bed mobility: Independent                Transfers Overall transfer level: Independent                  Ambulation/Gait Ambulation/Gait  assistance: Independent Ambulation Distance (Feet): 500 Feet Assistive device: None Gait Pattern/deviations: Step-through pattern   Gait velocity interpretation: at or above normal speed for age/gender    Stairs Stairs: Yes Stairs assistance: Supervision   Number of Stairs: 5 General stair comments: Ascend with step through, descend with step to with 1 handrail which pt reports is her normal pattern  Wheelchair Mobility    Modified Rankin (Stroke Patients Only) Modified Rankin (Stroke Patients Only) Pre-Morbid Rankin Score: No symptoms Modified Rankin: No significant disability     Balance Overall balance assessment: Modified Independent                               Standardized Balance Assessment Standardized Balance Assessment : Dynamic Gait Index   Dynamic Gait Index Level Surface: Normal Change in Gait Speed: Normal Gait with Horizontal Head Turns: Mild Impairment Gait with Vertical Head Turns: Normal Gait and Pivot Turn: Normal Step Over Obstacle: Normal Step Around Obstacles: Normal Steps: Normal Total Score: 23       Pertinent Vitals/Pain Pain Assessment: No/denies pain    Home Living Family/patient expects to be discharged to:: Private residence Living Arrangements: Alone Available Help at Discharge: Family;Neighbor;Available PRN/intermittently (Daughter lives 2 miles away) Type of Home: House Home Access: Stairs to enter Entrance Stairs-Rails: None (No rails, but ballister on Lt to hold onto with stairs) Entrance [employer]  of Steps: 2 Home Layout: One level Home Equipment: Shower seat - built in;Grab bars - tub/shower (Handicap height toilet) Additional Comments: Walk In Shower    Prior Function Level of Independence: Independent         Comments: Pt reports (I) with bed mobility skills, transfers, and household/community amb.  Pt still drives, and completes all ALDs and IALDs (I).         Extremity/Trunk Assessment    Upper Extremity Assessment: Defer to OT evaluation           Lower Extremity Assessment: Overall WFL for tasks assessed;RLE deficits/detail RLE Deficits / Details: Rt hip 4/5, Rt knee/ankle 5/5 LLE Deficits / Details: Lt hip 4/5, Lt hip/ankle 5/5     Communication   Communication: No difficulties  Cognition Arousal/Alertness: Awake/alert Behavior During Therapy: WFL for tasks assessed/performed Overall Cognitive Status: Within Functional Limits for tasks assessed                        Assessment/Plan    PT Assessment Patent does not need any further PT services  PT Diagnosis Other (comment) (Stroke)   PT Problem List    PT Treatment Interventions     PT Goals (Current goals can be found in the Care Plan section) Acute Rehab PT Goals PT Goal Formulation: All assessment and education complete, DC therapy     End of Session Equipment Utilized During Treatment: Gait belt Activity Tolerance: Patient tolerated treatment well Patient left: in chair;with call bell/phone within reach;with chair alarm set           Time: 4098-1191 PT Time Calculation (min) (ACUTE ONLY): 22 min   Charges:   PT Evaluation $Initial PT Evaluation Tier I: 1 Procedure PT Treatments $Therapeutic Activity: 8-22 mins   Lamario Mani 10/14/2014, 10:02 AM

## 2014-10-14 NOTE — Progress Notes (Signed)
  Echocardiogram 2D Echocardiogram has been performed.  [place], [place] 10/14/2014, 3:55 PM

## 2014-10-14 NOTE — Progress Notes (Signed)
OT Cancellation Note  Patient Details Name: Sheila Briggs MRN: [id number] DOB: 1931-12-26   Cancelled Treatment:    Reason Eval/Treat Not Completed: Patient at procedure or test/ unavailable.  Will re-attempt OT eval at later time/date.  Bea Graff Czar Ysaguirre, MS, OTR/L [facility] [phone] 10/14/2014, 8:31 AM

## 2014-10-14 NOTE — Progress Notes (Signed)
Patient ID: Sheila Briggs, female   DOB: 03/06/1932, 80 y.o.   MRN: [id number]  Sheila A. Merlene Laughter, MD     [email or web address]          DELONDA Briggs is an 80 y.o. female.   Assessment/Plan: 1. Acute infarct involving the putamen on the right side. Given the imaging findings, I suspect that the infarct is most likely due to small vessel disease. Consequently, full dose of aspirin is suggested. Blood pressure control is also suggested.  2. Extensive chronic microvascular ischemic changes.  3. Moderate right extracranial internal carotid artery stenosis likely asymptomatic. Continue with Surveillance every 6-12 months.  No complaints.   GENERAL: This a pleasant thin female in no acute distress.  HEENT: Supple. Atraumatic normocephalic.   ABDOMEN: soft  EXTREMITIES: No edema   BACK: Normal.  SKIN: Normal by inspection.   MENTAL STATUS: Alert and oriented. Speech, language and cognition are generally intact. Judgment and insight normal.   CRANIAL NERVES: Pupils are equal, round and reactive to light and accommodation; extra ocular movements are full, there is no significant nystagmus; visual fields are full; upper and lower facial muscles are normal in strength and symmetric, there is no flattening of the nasolabial folds; tongue is midline; uvula is midline; shoulder elevation is normal.  MOTOR: Normal tone, bulk and strength; no pronator drift.  COORDINATION: Right finger to nose normal. There is mild dysmetria of the left upper extremity and also some subtle dysmetria of the left leg. Right leg is normal. No tremors. No rigidity.  REFLEXES: Deep tendon reflexes are symmetrical and normal.    SENSATION: Normal to light touch.   The brain MRI is reviewed in person. There is increased signal seen on diffusion imaging involving the right putamen. It seems extends medially to the lateral ventricle. There is quite significant moderate confluent  periventricular and deep white matter leukoencephalopathy indicative of chronic microvascular ischemic changes.     Objective: Vital signs in last 24 hours: Temp:  [98.1 F (36.7 C)-99 F (37.2 C)] 98.1 F (36.7 C) (12/02 1805) Pulse Rate:  [78-102] 82 (12/02 1805) Resp:  [17-20] 20 (12/02 1805) BP: (113-166)/(40-75) 137/58 mmHg (12/02 1805) SpO2:  [93 %-98 %] 97 % (12/02 1805)  Intake/Output from previous day: 12/01 0701 - 12/02 0700 In: 60 [P.O.:60] Out: 1650 [Urine:1650] Intake/Output this shift: Total I/O In: 720 [P.O.:720] Out: 650 [Urine:650] Nutritional status: Diet regular   Lab Results: Results for orders placed or performed during the hospital encounter of 10/13/14 (from the past 48 hour(s))  Basic metabolic panel     Status: Abnormal   Collection Time: 10/13/14  2:04 PM  Result Value Ref Range   Sodium 137 137 - 147 mEq/L   Potassium 3.5 (L) 3.7 - 5.3 mEq/L   Chloride 94 (L) 96 - 112 mEq/L   CO2 29 19 - 32 mEq/L   Glucose, Bld 119 (H) 70 - 99 mg/dL   BUN 16 6 - 23 mg/dL   Creatinine, Ser 0.49 (L) 0.50 - 1.10 mg/dL   Calcium 9.7 8.4 - 10.5 mg/dL   GFR calc non Af Amer 90 (L) >90 mL/min   GFR calc Af Amer >90 >90 mL/min    Comment: (NOTE) The eGFR has been calculated using the CKD EPI equation. This calculation has not been validated in all clinical situations. eGFR's persistently <90 mL/min signify possible Chronic Kidney Disease.    Anion gap 14 5 - 15  CBC with Differential  Status: Abnormal   Collection Time: 10/13/14  2:04 PM  Result Value Ref Range   WBC 10.5 4.0 - 10.5 K/uL   RBC 4.73 3.87 - 5.11 MIL/uL   Hemoglobin 14.4 12.0 - 15.0 g/dL   HCT 42.1 36.0 - 46.0 %   MCV 89.0 78.0 - 100.0 fL   MCH 30.4 26.0 - 34.0 pg   MCHC 34.2 30.0 - 36.0 g/dL   RDW 13.6 11.5 - 15.5 %   Platelets 251 150 - 400 K/uL   Neutrophils Relative % 75 43 - 77 %   Neutro Abs 8.0 (H) 1.7 - 7.7 K/uL   Lymphocytes Relative 17 12 - 46 %   Lymphs Abs 1.8 0.7 - 4.0  K/uL   Monocytes Relative 7 3 - 12 %   Monocytes Absolute 0.7 0.1 - 1.0 K/uL   Eosinophils Relative 1 0 - 5 %   Eosinophils Absolute 0.1 0.0 - 0.7 K/uL   Basophils Relative 0 0 - 1 %   Basophils Absolute 0.0 0.0 - 0.1 K/uL  Hemoglobin A1c     Status: Abnormal   Collection Time: 10/13/14  8:13 PM  Result Value Ref Range   Hgb A1c MFr Bld 6.1 (H) <5.7 %    Comment: (NOTE)                                                                       According to the ADA Clinical Practice Recommendations for 2011, when HbA1c is used as a screening test:  >=6.5%   Diagnostic of Diabetes Mellitus           (if abnormal result is confirmed) 5.7-6.4%   Increased risk of developing Diabetes Mellitus References:Diagnosis and Classification of Diabetes Mellitus,Diabetes UDIL,4832,34(KMKTL 1):S62-S69 and Standards of Medical Care in         Diabetes - 2011,Diabetes Care,2011,34 (Suppl 1):S11-S61.    Mean Plasma Glucose 128 (H) <117 mg/dL    Comment: Performed at [facility]  Lipid panel     Status: Abnormal   Collection Time: 10/14/14  6:01 AM  Result Value Ref Range   Cholesterol 231 (H) 0 - 200 mg/dL   Triglycerides 97 <150 mg/dL   HDL 77 >39 mg/dL   Total CHOL/HDL Ratio 3.0 RATIO   VLDL 19 0 - 40 mg/dL   LDL Cholesterol 135 (H) 0 - 99 mg/dL    Comment:        Total Cholesterol/HDL:CHD Risk Coronary Heart Disease Risk Table                     Men   Women  1/2 Average Risk   3.4   3.3  Average Risk       5.0   4.4  2 X Average Risk   9.6   7.1  3 X Average Risk  23.4   11.0        Use the calculated Patient Ratio above and the CHD Risk Table to determine the patient's CHD Risk.        ATP III CLASSIFICATION (LDL):  <100     mg/dL   Optimal  100-129  mg/dL   Near or Above  Optimal  130-159  mg/dL   Borderline  160-189  mg/dL   High  >190     mg/dL   Very High     Lipid Panel  Recent Labs  10/14/14 0601  CHOL 231*  TRIG 97  HDL 77  CHOLHDL 3.0    VLDL 19  LDLCALC 135*    Studies/Results:  MRA BRAIN Both vertebral arteries are patent to the basilar. PICA patent bilaterally. Basilar widely patent. Superior cerebellar and posterior cerebral arteries widely patent.  Mild atherosclerotic disease in the cavernous carotid artery bilaterally with mild stenosis bilaterally.  Hypoplastic right A1 segment. Both anterior cerebral arteries are widely patent and supplied primarily via the left A1 segment. Right M1 segment widely patent. Mild stenosis at the origin of the right middle cerebral artery branches.  Left anterior cerebral artery widely patent. Left M1 segment widely patent. Left middle cerebral arteries widely patent.  Negative for cerebral aneurysm.  IMPRESSION: Mild atherosclerotic disease in the cavernous carotid bilaterally and in the right middle cerebral artery bifurcation. No flow limiting stenosis identified.   ECHO Mild LVH with LVEF 27-12%, grade 1 diastolic dysfunction with increased filling pressures. Trivial mitral and tricuspid regurgitation. No obvious PFO or ASD. Mildly increased PASP 40 mmHg.   CAROTID DOPPLERS ICA: Systolic 929 cm/sec, Diastolic 39 cm/sec  CCA: 090 cm/sec  SYSTOLIC ICA/CCA RATIO: 2.0  DIASTOLIC ICA/CCA RATIO: 2.8  ECA: 163 cm/sec  LEFT  ICA: Systolic 83 cm/sec, Diastolic 23 cm/sec  CCA: 52 cm/sec  SYSTOLIC ICA/CCA RATIO: 1.6  DIASTOLIC ICA/CCA RATIO: 1.7  ECA: 58 cm/sec  RIGHT CAROTID ARTERY: Mild intimal thickening of the right common carotid artery. Intermediate waveform maintained. Heterogeneous plaque at the right collided bulb extending into the right ICA, without significant calcifications. Low resistance waveform maintained. No significant tortuosity.  RIGHT VERTEBRAL ARTERY: Antegrade flow with low resistance waveform.  LEFT CAROTID ARTERY: Mild intimal thickening of the left CCA. Intermediate waveform maintained.  No significant atherosclerotic calcifications of the left common carotid artery.  Heterogeneous plaque at the left carotid bulb extending into the left ICA with minimal calcifications on the far field wall. Low resistance waveform of the left ICA. No significant tortuosity.  LEFT VERTEBRAL ARTERY: Antegrade flow with low resistance waveform.  IMPRESSION: Bilateral heterogeneous plaque at the carotid bifurcation without significant calcifications.  On the right, atherosclerotic disease contributes to 50%- 69% narrowing by duplex criteria.  Medications:  Scheduled Meds: . amLODipine  2.5 mg Oral Daily  . aspirin  324 mg Oral Daily  . citalopram  10 mg Oral Daily  . heparin  5,000 Units Subcutaneous 3 times per day  . hydrochlorothiazide  12.5 mg Oral Daily  . levothyroxine  25 mcg Oral QAC breakfast  . multivitamin with minerals  1 tablet Oral Daily  . pantoprazole  40 mg Oral Daily  . polyvinyl alcohol  1 drop Both Eyes QID  . potassium chloride  20 mEq Oral TID  . pravastatin  10 mg Oral q1800   Continuous Infusions:  PRN Meds:.senna-docusate     LOS: 1 day   Betina Puckett A. Merlene Briggs, M.D.  Diplomate, Tax adviser of Psychiatry and Neurology ( Neurology).

## 2014-10-14 NOTE — Progress Notes (Signed)
SLP Cancellation Note  Patient Details Name: Sheila Briggs MRN: [id number] DOB: [birth year]   Cancelled treatment:       Reason Eval/Treat Not Completed: SLP screened, no needs identified, will sign off Pt interviewed at bedside. She reports no changes with thinking, memory, speech, or language. She passed RN swallow screen and is tolerating diet well. She endorses mild weakness of left side. No further SLP services indicated at this time. Reconsult PRN.  Thank you,  Genene Churn, [place]    [place] 10/14/2014, 2:45 PM

## 2014-10-15 ENCOUNTER — Ambulatory Visit (INDEPENDENT_AMBULATORY_CARE_PROVIDER_SITE_OTHER): Payer: Medicare Other | Admitting: Otolaryngology

## 2014-10-15 MED ORDER — ASPIRIN EC 325 MG PO TBEC: 325.0000 mg | DELAYED_RELEASE_TABLET | Freq: Every day | ORAL | Status: DC

## 2014-10-15 MED ORDER — ATORVASTATIN CALCIUM 40 MG PO TABS: 40.0000 mg | ORAL_TABLET | Freq: Every day | ORAL | Status: DC

## 2014-10-15 MED ORDER — RAMIPRIL 2.5 MG PO CAPS: 2.5000 mg | ORAL_CAPSULE | Freq: Every day | ORAL | Status: DC

## 2014-10-15 NOTE — Discharge Summary (Signed)
Physician Discharge Summary  Sheila Briggs RVU:[id number] DOB: 12/23/1931 DOA: 10/13/2014   Admit date: 10/13/2014 Discharge date: 10/15/2014  Discharge Diagnoses: #1. Right basal ganglia stroke. #2. Hypertension. #3. Hyperlipidemia. #4. Hypothyroidism. #5. Prediabetes. Active Problems:   Hypertension   Hypothyroid   CVA (cerebral infarction)    Wt Readings from Last 3 Encounters:  10/13/14 123 lb 14.4 oz (56.2 kg)  10/13/14 125 lb (56.7 kg)  03/03/14 125 lb (56.7 kg)     Hospital Course:  This patient is an 80 year old female who presented with dizziness. She underwent a CT scan of the brain initially which revealed extensive small vessel disease and a possible new infarct. She then had an MRI of the brain which revealed an acute infarct in the right basal ganglia. An MRA revealed no significant intracranial stenosis or aneurysm. A carotid ultrasound revealed a 60-79% stenosis on the right consistent with her prior ultrasound. An echocardiogram revealed normal left ventricular function with mild LVH. There was no sign of thrombus. EKG reveals sinus rhythm. She has had no dysrhythmias on cardiac monitoring.  She was seen in neurology consultation by Dr. Corey Skains. Aspirin dose was modified to 325 mg daily. She has previously been on statin therapy with low-dose pravastatin. This will be modified to atorvastatin 40 mg daily. Ramipril 2.5 mg daily will be added. She will continue amlodipine and hydrochlorothiazide for her hypertension is well. She has prediabetes with a hemoglobin A1c of 6.1.  Her symptoms significantly improved. She feels some slight weakness in her left lower extremity however this is not evident on exam. There is no weakness in the upper extremities. Condition at discharge is improved. She will be seen in follow-up in my office in one week.   Discharge Instructions     Medication List    STOP taking these medications        omeprazole 20 MG capsule  Commonly known  as:  PRILOSEC     pravastatin 10 MG tablet  Commonly known as:  PRAVACHOL      TAKE these medications        ALEVE 220 MG tablet  Generic drug:  naproxen sodium  Take 220 mg by mouth 2 (two) times daily as needed. Pain.     amLODipine 2.5 MG tablet  Commonly known as:  NORVASC  Take 2.5 mg by mouth daily.     aspirin EC 325 MG tablet  Take 1 tablet (325 mg total) by mouth daily.     atorvastatin 40 MG tablet  Commonly known as:  LIPITOR  Take 1 tablet (40 mg total) by mouth daily.     citalopram 10 MG tablet  Commonly known as:  CELEXA  Take 10 mg by mouth daily.     hydrochlorothiazide 12.5 MG tablet  Commonly known as:  HYDRODIURIL  Take 12.5 mg by mouth daily.     multivitamin with minerals Tabs tablet  Take 1 tablet by mouth daily.     ramipril 2.5 MG capsule  Commonly known as:  ALTACE  Take 1 capsule (2.5 mg total) by mouth daily.     SYNTHROID 25 MCG tablet  Generic drug:  levothyroxine  Take 25 mcg by mouth daily.     SYSTANE ULTRA OP  Apply 1 drop to eye 4 (four) times daily.         Joshuan Bolander 10/15/2014

## 2014-10-15 NOTE — Plan of Care (Signed)
Problem: Progression Outcomes Goal: Communication method established Outcome: Completed/Met Date Met:  10/15/14 Goal: If vent dependent, tolerates weaning Outcome: Not Applicable Date Met:  98/72/15 Goal: Rehab Team goals identified Outcome: Not Applicable Date Met:  87/27/61 Goal: Progressive activity as tolerated Outcome: Completed/Met Date Met:  10/15/14 Goal: Tolerating diet/TF at goal rate Outcome: Completed/Met Date Met:  10/15/14 Goal: Pain controlled Outcome: Completed/Met Date Met:  10/15/14 Goal: Bowel & Bladder Continence Outcome: Completed/Met Date Met:  10/15/14 Goal: Educational plan initiated Outcome: Completed/Met Date Met:  10/15/14 Goal: Initial discharge plan initiated Outcome: Completed/Met Date Met:  10/15/14  Problem: Discharge/Transitional Outcomes Goal: Barriers To Progression Addressed/Resolved Outcome: Completed/Met Date Met:  10/15/14 Goal: Educational Plan Complete Outcome: Completed/Met Date Met:  10/15/14 Goal: Hemodynamically stable Outcome: Completed/Met Date Met:  10/15/14 Goal: If vent dependent, trach in place Outcome: Not Applicable Date Met:  84/85/92 Goal: Independent mobility/functioning independent or with min Independent mobility/functioning independently or with minimal assistance  Outcome: Completed/Met Date Met:  10/15/14 Goal: Tolerating diet/TF at goal rate-PEG if inadequate intake Outcome: Completed/Met Date Met:  10/15/14 Goal: Family and patient agree upon discharge plan Outcome: Completed/Met Date Met:  10/15/14 Goal: Family/Caregiver willing and able to support plan Family/Caregiver willing and able to support plan for self-management after transition home  Outcome: Completed/Met Date Met:  10/15/14 Goal: PCP appointment made and transportation plan in place Outcome: Completed/Met Date Met:  10/15/14 Goal: Ability to attain medications upon leaving hospital Outcome: Completed/Met Date Met:  10/15/14

## 2014-10-15 NOTE — Progress Notes (Signed)
Orders received for discharge; instructions given; medications reviewed; stroke education received; all questions answered and understanding verbalized. IV removed; site clean and dry; no complications. Pt taken out via wheelchair via NT with family member present.

## 2014-10-22 DIAGNOSIS — I10 Essential (primary) hypertension: Secondary | ICD-10-CM | POA: Diagnosis not present

## 2014-10-22 DIAGNOSIS — G464 Cerebellar stroke syndrome: Secondary | ICD-10-CM | POA: Diagnosis not present

## 2014-11-19 ENCOUNTER — Ambulatory Visit (INDEPENDENT_AMBULATORY_CARE_PROVIDER_SITE_OTHER): Payer: Medicare Other | Admitting: Otolaryngology

## 2014-11-19 DIAGNOSIS — D440 Neoplasm of uncertain behavior of thyroid gland: Secondary | ICD-10-CM | POA: Diagnosis not present

## 2014-11-26 DIAGNOSIS — K219 Gastro-esophageal reflux disease without esophagitis: Secondary | ICD-10-CM | POA: Diagnosis not present

## 2014-11-26 DIAGNOSIS — I10 Essential (primary) hypertension: Secondary | ICD-10-CM | POA: Diagnosis not present

## 2014-11-26 DIAGNOSIS — E039 Hypothyroidism, unspecified: Secondary | ICD-10-CM | POA: Diagnosis not present

## 2014-11-26 DIAGNOSIS — Z79899 Other long term (current) drug therapy: Secondary | ICD-10-CM | POA: Diagnosis not present

## 2014-11-30 DIAGNOSIS — H26492 Other secondary cataract, left eye: Secondary | ICD-10-CM | POA: Diagnosis not present

## 2014-11-30 DIAGNOSIS — H35353 Cystoid macular degeneration, bilateral: Secondary | ICD-10-CM | POA: Diagnosis not present

## 2014-11-30 DIAGNOSIS — H04129 Dry eye syndrome of unspecified lacrimal gland: Secondary | ICD-10-CM | POA: Diagnosis not present

## 2014-11-30 DIAGNOSIS — Z961 Presence of intraocular lens: Secondary | ICD-10-CM | POA: Diagnosis not present

## 2014-12-03 DIAGNOSIS — E785 Hyperlipidemia, unspecified: Secondary | ICD-10-CM | POA: Diagnosis not present

## 2014-12-03 DIAGNOSIS — G464 Cerebellar stroke syndrome: Secondary | ICD-10-CM | POA: Diagnosis not present

## 2014-12-03 DIAGNOSIS — I10 Essential (primary) hypertension: Secondary | ICD-10-CM | POA: Diagnosis not present

## 2014-12-17 ENCOUNTER — Ambulatory Visit (INDEPENDENT_AMBULATORY_CARE_PROVIDER_SITE_OTHER): Payer: Medicare Other | Admitting: Orthopedic Surgery

## 2014-12-17 ENCOUNTER — Encounter: Payer: Self-pay | Admitting: Orthopedic Surgery

## 2014-12-17 ENCOUNTER — Ambulatory Visit (INDEPENDENT_AMBULATORY_CARE_PROVIDER_SITE_OTHER): Payer: Medicare Other

## 2014-12-17 VITALS — BP 141/64 | Ht 62.0 in | Wt 125.0 lb

## 2014-12-17 DIAGNOSIS — M542 Cervicalgia: Secondary | ICD-10-CM | POA: Diagnosis not present

## 2014-12-17 DIAGNOSIS — M25522 Pain in left elbow: Secondary | ICD-10-CM

## 2014-12-17 MED ORDER — DICLOFENAC POTASSIUM 50 MG PO TABS: 50.0000 mg | ORAL_TABLET | Freq: Two times a day (BID) | ORAL | Status: DC

## 2014-12-17 MED ORDER — GABAPENTIN 100 MG PO CAPS: 100.0000 mg | ORAL_CAPSULE | Freq: Three times a day (TID) | ORAL | Status: DC

## 2014-12-17 NOTE — Addendum Note (Signed)
Addended by: Carole Civil on: 12/17/2014 03:44 PM   Modules accepted: Orders

## 2014-12-17 NOTE — Patient Instructions (Signed)
Wear tennis elbow brace for 6 weeks

## 2014-12-17 NOTE — Progress Notes (Signed)
Patient ID: Sheila Briggs, female   DOB: 10-21-1932, 81 y.o.   MRN: [id number]  Chief Complaint  Patient presents with  . Arm Pain    left elbow pain, no known injury    HPI Sheila Briggs is a 81 y.o. female.  Presents with a constellation of symptoms which I'm not sure if they're related or not. In any event she has pain in her left arm around the lateral aspect of the left elbow and pain in the periscapular region of the left shoulder which occasionally radiates down into the left hand with numbness and tingling and severe aching with increased pain at night and 6 out of 10 intensity. No previous treatment  HPI  Review of Systems Review of Systems  hearing loss or sinus problems vision problems cold intolerance joint pain gait pain numbness and tingling all other systems negative HPI   Past Medical History  Diagnosis Date  . Thyroid disease   . Hypertension   . Hypercholesteremia   . GERD (gastroesophageal reflux disease)   . Hypothyroidism   . Anxiety   . Cancer     basal cell of scalp  . Arthritis   . HOH (hard of hearing)     Past Surgical History  Procedure Laterality Date  . Biopsy thyroid      benign  . Abdominal hysterectomy    . Cholecystectomy    . Appendectomy    . Tonsillectomy    . Cataract extraction w/phaco  12/19/2012    Procedure: CATARACT EXTRACTION PHACO AND INTRAOCULAR LENS PLACEMENT (IOC);  Surgeon: Tonny Branch, MD;  Location: [facility];  Service: Ophthalmology;  Laterality: Left;  CDE 13.75  . Cataract extraction w/phaco Right 01/06/2013    Procedure: CATARACT EXTRACTION PHACO AND INTRAOCULAR LENS PLACEMENT (IOC);  Surgeon: Tonny Branch, MD;  Location: [facility];  Service: Ophthalmology;  Laterality: Right;  CDE: 9.96    No family history on file.  Social History History  Substance Use Topics  . Smoking status: Never Smoker   . Smokeless tobacco: Not on file  . Alcohol Use: No    No Known Allergies  Current Outpatient Prescriptions  Medication  Sig Dispense Refill  . amLODipine (NORVASC) 2.5 MG tablet Take 2.5 mg by mouth daily.     Marland Kitchen aspirin EC 325 MG tablet Take 1 tablet (325 mg total) by mouth daily. 30 tablet 0  . atorvastatin (LIPITOR) 40 MG tablet Take 1 tablet (40 mg total) by mouth daily. 30 tablet 12  . citalopram (CELEXA) 10 MG tablet Take 10 mg by mouth daily.    . hydrochlorothiazide (HYDRODIURIL) 12.5 MG tablet Take 12.5 mg by mouth daily.     . Multiple Vitamin (MULTIVITAMIN WITH MINERALS) TABS Take 1 tablet by mouth daily.    Vladimir Faster Glycol-Propyl Glycol (SYSTANE ULTRA OP) Apply 1 drop to eye 4 (four) times daily.    . ramipril (ALTACE) 2.5 MG capsule Take 1 capsule (2.5 mg total) by mouth daily. 30 capsule 12  . SYNTHROID 25 MCG tablet Take 25 mcg by mouth daily.     . naproxen sodium (ALEVE) 220 MG tablet Take 220 mg by mouth 2 (two) times daily as needed. Pain.     No current facility-administered medications for this visit.       Physical Exam Blood pressure 141/64, height 5\' 2"  (1.575 m), weight 125 lb (56.7 kg). Physical Exam Several areas of tenderness lateral elbow. Medial border of scapula. Severe pain decreased range of motion  and increased muscle tension in the cervical spine  Vitals are stable appearance is normal she is oriented 3 mood is excellent gait is normal without limp or nystatin was  Reflexes 2+ symmetric and equal skin intact pulses good bilaterally lymph nodes negative bilaterally in both axilla and supraclavicular regions  Shoulder range of motion was normal  Data Reviewed Left elbow negative for fracture  C-spine severe spondylosis  Assessment    Cervicalgia Tennis elbow     Plan    Tennis elbow brace for tennis elbow  Recommend diclofenac 50 mg twice a day 60 with one refill and Ron 100 mg 3 times a day 60 one refill.

## 2014-12-30 DIAGNOSIS — L821 Other seborrheic keratosis: Secondary | ICD-10-CM | POA: Diagnosis not present

## 2014-12-30 DIAGNOSIS — D225 Melanocytic nevi of trunk: Secondary | ICD-10-CM | POA: Diagnosis not present

## 2014-12-30 DIAGNOSIS — L723 Sebaceous cyst: Secondary | ICD-10-CM | POA: Diagnosis not present

## 2014-12-30 DIAGNOSIS — X32XXXD Exposure to sunlight, subsequent encounter: Secondary | ICD-10-CM | POA: Diagnosis not present

## 2014-12-30 DIAGNOSIS — L570 Actinic keratosis: Secondary | ICD-10-CM | POA: Diagnosis not present

## 2015-01-28 ENCOUNTER — Ambulatory Visit (INDEPENDENT_AMBULATORY_CARE_PROVIDER_SITE_OTHER): Payer: Medicare Other | Admitting: Orthopedic Surgery

## 2015-01-28 ENCOUNTER — Encounter: Payer: Self-pay | Admitting: Orthopedic Surgery

## 2015-01-28 VITALS — BP 132/62 | Ht 62.0 in | Wt 125.0 lb

## 2015-01-28 DIAGNOSIS — M25522 Pain in left elbow: Secondary | ICD-10-CM | POA: Diagnosis not present

## 2015-01-28 DIAGNOSIS — M7712 Lateral epicondylitis, left elbow: Secondary | ICD-10-CM | POA: Diagnosis not present

## 2015-01-28 NOTE — Progress Notes (Signed)
Patient ID: Sheila Briggs, female   DOB: September 22, 1932, 81 y.o.   MRN: [id number]  established problem worse  Chief Complaint  Patient presents with  . Follow-up    6 week recheck on left elbow in brace.     81 year old female being treated for tennis elbow and some unexplained numbness in her left forearm. She also complains today of some discomfort in her cervical spine but her pain does not radiate to the elbow  From the elbow down she's having some intermittent numbness which has gotten a little bit better on Neurontin.  I think we can stop that at this point. She still having pain all over the lateral elbow with painful wrist extension especially when holding any objects in her hand  Review of systems as above related to the numbness tingling  Exam shows her appearance is normal she is oriented 3 inspection reveals no swelling over the elbow but tenderness over the lateral epicondyle. She has full range of motion pain with wrist extension elbow ligaments are stable strength is normal scans intact pulses are good  Impression refractory tennis elbow  Recommend injection continue brace stop Neurontin follow-up one month  Procedure note injection for tennis elbow LEFT   Diagnosis tennis elbow LEFT    Anesthesia ethyl chloride was used Alcohol use is clean the skin  After we obtained verbal consent and timeout a 25-gauge needle was used to inject 40 mg of Depo-Medrol and 3 cc of 1% lidocaine.  There were no complications and a sterile bandage was applied.

## 2015-03-08 ENCOUNTER — Encounter: Payer: Self-pay | Admitting: Orthopedic Surgery

## 2015-03-08 ENCOUNTER — Ambulatory Visit (INDEPENDENT_AMBULATORY_CARE_PROVIDER_SITE_OTHER): Payer: Medicare Other | Admitting: Orthopedic Surgery

## 2015-03-08 VITALS — BP 132/62 | Ht 62.0 in | Wt 125.0 lb

## 2015-03-08 DIAGNOSIS — M47812 Spondylosis without myelopathy or radiculopathy, cervical region: Secondary | ICD-10-CM | POA: Diagnosis not present

## 2015-03-08 DIAGNOSIS — M25522 Pain in left elbow: Secondary | ICD-10-CM | POA: Diagnosis not present

## 2015-03-08 DIAGNOSIS — M7712 Lateral epicondylitis, left elbow: Secondary | ICD-10-CM | POA: Diagnosis not present

## 2015-03-08 NOTE — Progress Notes (Signed)
Progress no follow-up visit established problem and new problem  Chief Complaint  Patient presents with  . Follow-up    1 month follow up Left tennis elbow    The patient was treated for tennis elbow, she has responded well to treatment nonoperatively. She has no lateral elbow pain or forearm pain  However she has neck pain and radicular pain on her left side. Reviewing her notes she had an MRI in 2002 by Dr. Ellene Route she has cervical spondylosis without compressive pathology at that point  At this time she is having neck pain at the base of her spine and top of the thoracic spine this radiates into the left arm and down to the left hand causing left hand numbness.  Her previous treatment included physical therapy and gabapentin as well as nonsteroidal anti-inflammatory medications.  Review of systems reveals no bowel or bladder dysfunction at this time  Her exam today shows that she is awake alert and oriented 3 mood and affect are normal her overall appearance is well groomed her vital signs are stable she is ambulatory without any assistive device and no disturbance in her walking pattern. She has tenderness at the base of the cervical spine loss of motion in all planes with a positive Spurling sign on the left no instability motor exam is normal reflexes are 2+ skin is normal and pulses are normal. There is decreased sensation globally in the hand involving the fingertips palm dorsum and palmar aspects  C-spine films show cervical spondylosis  Recommend MRI to reimage the spine 4 years after noncompressive spondylosis was found with new findings of left arm numbness neck stiffness positive Spurling sign failed Neurontin and anti-inflammatories. Epidural steroid injection and or referrals will be planned if symptoms and studies warrant

## 2015-03-08 NOTE — Patient Instructions (Signed)
We will schedule MRI for you and call you with results 

## 2015-03-15 ENCOUNTER — Other Ambulatory Visit (HOSPITAL_COMMUNITY): Payer: Self-pay | Admitting: Internal Medicine

## 2015-03-15 DIAGNOSIS — Z1231 Encounter for screening mammogram for malignant neoplasm of breast: Secondary | ICD-10-CM

## 2015-03-25 ENCOUNTER — Ambulatory Visit (HOSPITAL_COMMUNITY)
Admission: RE | Admit: 2015-03-25 | Discharge: 2015-03-25 | Disposition: A | Discharge status: Home / Self Care | Payer: Medicare Other | Source: Ambulatory Visit | Attending: Orthopedic Surgery | Admitting: Orthopedic Surgery

## 2015-03-25 DIAGNOSIS — M5022 Other cervical disc displacement, mid-cervical region: Secondary | ICD-10-CM | POA: Diagnosis not present

## 2015-03-25 DIAGNOSIS — M47812 Spondylosis without myelopathy or radiculopathy, cervical region: Secondary | ICD-10-CM | POA: Diagnosis not present

## 2015-03-25 DIAGNOSIS — M9971 Connective tissue and disc stenosis of intervertebral foramina of cervical region: Secondary | ICD-10-CM | POA: Diagnosis not present

## 2015-03-25 DIAGNOSIS — M4312 Spondylolisthesis, cervical region: Secondary | ICD-10-CM | POA: Diagnosis not present

## 2015-03-26 ENCOUNTER — Telehealth: Payer: Self-pay | Admitting: Orthopedic Surgery

## 2015-03-26 NOTE — Telephone Encounter (Signed)
PHYSICAL THERAPY C SPINE SPONDYLOSIS 3 X A WEEK FOR 6 WEEKS   DR TOOLE  [facility] C SPINE

## 2015-03-31 ENCOUNTER — Telehealth: Payer: Self-pay | Admitting: *Deleted

## 2015-03-31 ENCOUNTER — Other Ambulatory Visit: Payer: Self-pay | Admitting: *Deleted

## 2015-03-31 ENCOUNTER — Encounter: Payer: Self-pay | Admitting: *Deleted

## 2015-03-31 DIAGNOSIS — M47812 Spondylosis without myelopathy or radiculopathy, cervical region: Secondary | ICD-10-CM

## 2015-03-31 NOTE — Telephone Encounter (Signed)
REFERRAL FAXED TO [facility] FOR [facility]

## 2015-03-31 NOTE — Telephone Encounter (Signed)
This encounter was created in error - please disregard.

## 2015-04-05 ENCOUNTER — Ambulatory Visit (HOSPITAL_COMMUNITY)
Admission: RE | Admit: 2015-04-05 | Discharge: 2015-04-05 | Disposition: A | Discharge status: Home / Self Care | Payer: Medicare Other | Source: Ambulatory Visit | Attending: Internal Medicine | Admitting: Internal Medicine

## 2015-04-05 DIAGNOSIS — Z1231 Encounter for screening mammogram for malignant neoplasm of breast: Secondary | ICD-10-CM | POA: Insufficient documentation

## 2015-04-07 NOTE — Telephone Encounter (Signed)
STILL PENDING REVIEW AS OF 04/07/15

## 2015-04-08 DIAGNOSIS — I10 Essential (primary) hypertension: Secondary | ICD-10-CM | POA: Diagnosis not present

## 2015-04-08 DIAGNOSIS — M791 Myalgia: Secondary | ICD-10-CM | POA: Diagnosis not present

## 2015-04-19 ENCOUNTER — Ambulatory Visit (HOSPITAL_COMMUNITY): Payer: Medicare Other | Attending: Orthopedic Surgery | Admitting: Physical Therapy

## 2015-04-19 DIAGNOSIS — M2560 Stiffness of unspecified joint, not elsewhere classified: Secondary | ICD-10-CM | POA: Diagnosis not present

## 2015-04-19 DIAGNOSIS — M6281 Muscle weakness (generalized): Secondary | ICD-10-CM | POA: Diagnosis not present

## 2015-04-19 DIAGNOSIS — M542 Cervicalgia: Secondary | ICD-10-CM | POA: Diagnosis not present

## 2015-04-19 DIAGNOSIS — R531 Weakness: Secondary | ICD-10-CM

## 2015-04-19 DIAGNOSIS — R208 Other disturbances of skin sensation: Secondary | ICD-10-CM | POA: Insufficient documentation

## 2015-04-19 DIAGNOSIS — R6889 Other general symptoms and signs: Secondary | ICD-10-CM | POA: Diagnosis not present

## 2015-04-19 DIAGNOSIS — R200 Anesthesia of skin: Secondary | ICD-10-CM

## 2015-04-19 NOTE — Patient Instructions (Signed)
Healthy Back - Shoulder Pumping   Stand straight with legs apart and arms relaxed at sides. Raise and lower shoulders IN A CIRCULAR PATTERN using continuous pumping movements. Do _10_ times. Do 3 times per day.  Copyright  VHI. All rights reserved.  Shoulder Extension (Sit Box)   Press arms into bottom of chair. Hold 5 seconds and relax. Do 10_ times, 3 times per day.  Copyright  VHI. All rights reserved.  SCAPULA: Retraction   Pinch shoulder blades together. Do not shrug shoulders. Hold _5__ seconds.  _10__ reps per set, _3__ sets per day  Copyright  VHI. All rights reserved.

## 2015-04-19 NOTE — Therapy (Signed)
[place], [place], [address] Phone: [phone]   Fax:  [phone]  Physical Therapy Evaluation  Patient Details  Name: Sheila Briggs MRN: [id number] Date of Birth: 1932-08-22 Referring Provider:  Carole Civil, MD  Encounter Date: 04/19/2015      PT End of Session - 04/19/15 1610    Visit Number 1   Number of Visits 8   Date for PT Re-Evaluation 05/19/15   Authorization Type Medicare   Authorization Time Period 04/19/15 - 06/19/15   Authorization - Visit Number 1   Authorization - Number of Visits 10   PT Start Time 7510   PT Stop Time 1600   PT Time Calculation (min) 45 min   Activity Tolerance Patient tolerated treatment well;No increased pain   Behavior During Therapy [facility] for tasks assessed/performed      Past Medical History  Diagnosis Date  . Thyroid disease   . Hypertension   . Hypercholesteremia   . GERD (gastroesophageal reflux disease)   . Hypothyroidism   . Anxiety   . Cancer     basal cell of scalp  . Arthritis   . HOH (hard of hearing)     Past Surgical History  Procedure Laterality Date  . Biopsy thyroid      benign  . Abdominal hysterectomy    . Cholecystectomy    . Appendectomy    . Tonsillectomy    . Cataract extraction w/phaco  12/19/2012    Procedure: CATARACT EXTRACTION PHACO AND INTRAOCULAR LENS PLACEMENT (IOC);  Surgeon: Tonny Branch, MD;  Location: [facility];  Service: Ophthalmology;  Laterality: Left;  CDE 13.75  . Cataract extraction w/phaco Right 01/06/2013    Procedure: CATARACT EXTRACTION PHACO AND INTRAOCULAR LENS PLACEMENT (IOC);  Surgeon: Tonny Branch, MD;  Location: [facility];  Service: Ophthalmology;  Laterality: Right;  CDE: 9.96    There were no vitals filed for this visit.  Visit Diagnosis:  Decreased strength - Plan: PT plan of care cert/re-cert  Decreased range of motion - Plan: PT plan of care cert/re-cert  Decreased functional activity tolerance - Plan: PT  plan of care cert/re-cert  Neck pain - Plan: PT plan of care cert/re-cert  Left upper extremity numbness - Plan: PT plan of care cert/re-cert      Subjective Assessment - 04/19/15 1516    Subjective Problems go back to 2012 and have been going on for quite awhile. Can't turn neck much  and Lt arm goes numb intermittantly during the day. Not sure what causes it. Seems to hurt when tuning head from side to side. Denies any specific injury./   How long can you sit comfortably? "longer I sit the harder it is to get up"    How long can you walk comfortably? not restricted per patient report   Patient Stated Goals Get rid of the numbness and pain or atleast help it.    Currently in Pain? Yes   Pain Score 3    Pain Location Arm   Pain Orientation Left   Pain Descriptors / Indicators Numbness   Pain Onset More than a month ago   Pain Frequency Intermittent   Aggravating Factors  Unsure   Pain Relieving Factors taking it easy   Effect of Pain on Daily Activities Still going out and doing what she wants but not for as long now.            [facility] PT Assessment - 04/19/15 0001  Assessment   Medical Diagnosis cervical spondylosis   Onset Date/Surgical Date 11/02/15   Next MD Visit as needed    Prior Therapy none for this   Precautions   Precautions None   Balance Screen   Has the patient fallen in the past 6 months No   [place] residence   Living Arrangements Alone   Prior Function   Level of [place] Retired   Leisure gardening   Cognition   Overall Cognitive Status Within Functional Limits for tasks assessed   Observation/Other Assessments   Observations no noted edema or erythema   Focus on Therapeutic Outcomes (FOTO)  33 % limited   Sensation   Light Touch Impaired by gross assessment   Additional Comments Lt arm distal to elbow diminished to light touch   Posture/Postural Control   Posture/Postural  Control Postural limitations   Postural Limitations Forward head   AROM   Cervical Flexion 15 degrees with pain complaints at Lt upper trap   Cervical Extension 25 degrees with pain complaints at Lt upper trap   Cervical - Right Side Bend 10 degrees with pain complaints at Lt upper trap   Cervical - Left Side Bend 5 degrees with pain complaints at Lt upper trap   Cervical - Right Rotation 20 degrees with pain complaints at Lt upper trap   Cervical - Left Rotation 20 degrees with pain complaints at Lt upper trap.   Strength   Overall Strength Comments cervical myotomes grossly 4+/5 on rt and 4/5 on Lt. Complains on scapular pain with resisted shoulder flexion.    Palpation   Palpation comment pain reproduction at bilateral upper trapezius and levator scapular distal attachment Lt > Rt.   Transfers   Transfers Sit to Stand   Sit to Stand 7: Independent   Ambulation/Gait   Ambulation/Gait Yes   Ambulation/Gait Assistance 7: Independent   Ambulation Distance (Feet) 200 Feet   Assistive device None                           PT Education - 04/19/15 1609    Education provided Yes   Education Details Effects of muscule tension on cervical pain and impact on ongoing sympoms. Started on HEP, cautioned to stop any activity that causes pain including HEP.    Person(s) Educated Patient   Methods Explanation;Demonstration;Verbal cues;Handout   Comprehension Verbalized understanding;Need further instruction          PT Short Term Goals - 04/19/15 1622    PT SHORT TERM GOAL #1   Title Patient to demonstrate independent postural corrections for decreased strain through the cervical region.    Time 2   Period Weeks   Status New   PT SHORT TERM GOAL #2   Title Patient to be independent with initial band resisted scapular stabilization exercises for home exercise program.    Time 2   Period Weeks   Status New   PT SHORT TERM GOAL #3   Title Patient to report ability to  increase her time in the garden for progression of recreational tasks.    Time 3   Period Weeks   Status New           PT Long Term Goals - 04/19/15 1625    PT LONG TERM GOAL #1   Title Patient to rate her pain as less than or equal to 3/10 wtih gardening.  Time 4   Period Weeks   Status New   PT LONG TERM GOAL #2   Title Patient to be independent with scapular stabilization program for continued gains upon D/C from PT services.    Time 4   Period Weeks   Status New   PT LONG TERM GOAL #3   Title Patient to report a reduction in her numbness in the lt upper extremity for performing household chores.    Time 4   Period Weeks   Status New               Plan - 05/12/2015 1612    Clinical Impression Statement Patient is presenting to physical therapy with reports of long standing and ongoing cervical pain. She states that she has been diagnosed with cervical spondylosis and she is trying to avoid having any surgical intervention. She does have descriptions of decreasd sensation in the Lt upper extremity. Myotome testing is grossy negative with a slight decrease in strength on the Lt however the patient states that this may be due to her previous CVA. Overall the patient is presenting with decreased cervical range of motion, decreased scapular strength/stability and postural deviations which may be contributing to the patients ongoing pain. The patient does live alone and needs to continue to be independent with her ADLs. She is appropriate for ongoing skilled PT intervention to address these areas.    Pt will benefit from skilled therapeutic intervention in order to improve on the following deficits Decreased activity tolerance;Decreased strength;Improper body mechanics;Impaired UE functional use;Decreased range of motion   Rehab Potential Fair   PT Frequency 2x / week   PT Duration 4 weeks   PT Treatment/Interventions Therapeutic exercise;Manual techniques;Therapeutic  activities;Patient/family education;Ultrasound;Electrical Stimulation   PT Next Visit Plan Focus on gentle but progressive scapular stabilizaion program, including postural education. Review reading and tablet posture at home.    PT Home Exercise Plan Scapular stabilization and mobilization exercises.    Consulted and Agree with Plan of Care Patient          G-Codes - 2015-05-12 1629    Functional Assessment Tool Used FOTO 33% limitation and clinical judgment   Functional Limitation Carrying, moving and handling objects   Carrying, Moving and Handling Objects Current Status [phone]) At least 20 percent but less than 40 percent impaired, limited or restricted   Carrying, Moving and Handling Objects Goal Status ([id number]) At least 1 percent but less than 20 percent impaired, limited or restricted       Problem List Patient Active Problem List   Diagnosis Date Noted  . CVA (cerebral infarction) 10/13/2014  . Arthritis of knee, degenerative 01/29/2014  . Hypothyroid 06/10/2012  . Hypertension   . 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 KNEE PAIN 03/15/2009  . IMPINGEMENT SYNDROME 03/11/2008  . SHOULDER PAIN 10/16/2007  . HIGH BLOOD PRESSURE 10/16/2007    Cassell Clement, PT, CSCS  05/12/2015, 4:33 PM  [facility] [address] [place], [place], [address] Phone: [phone]   Fax:  [phone]

## 2015-04-22 ENCOUNTER — Ambulatory Visit (HOSPITAL_COMMUNITY): Payer: Medicare Other | Admitting: Physical Therapy

## 2015-04-22 DIAGNOSIS — R531 Weakness: Secondary | ICD-10-CM

## 2015-04-22 DIAGNOSIS — M6281 Muscle weakness (generalized): Secondary | ICD-10-CM | POA: Diagnosis not present

## 2015-04-22 DIAGNOSIS — M2560 Stiffness of unspecified joint, not elsewhere classified: Secondary | ICD-10-CM

## 2015-04-22 DIAGNOSIS — R208 Other disturbances of skin sensation: Secondary | ICD-10-CM | POA: Diagnosis not present

## 2015-04-22 DIAGNOSIS — M542 Cervicalgia: Secondary | ICD-10-CM

## 2015-04-22 DIAGNOSIS — R6889 Other general symptoms and signs: Secondary | ICD-10-CM

## 2015-04-22 DIAGNOSIS — R200 Anesthesia of skin: Secondary | ICD-10-CM

## 2015-04-22 NOTE — Therapy (Signed)
[place], [place], [address] Phone: [phone]   Fax:  [phone]  Physical Therapy Treatment  Patient Details  Name: Sheila Briggs MRN: [id number] Date of Birth: 1932-04-21 Referring Provider:  Asencion Noble, MD  Encounter Date: 04/22/2015      PT End of Session - 04/22/15 1906    Visit Number 2   Number of Visits 8   Date for PT Re-Evaluation 05/19/15   Authorization Type Medicare   Authorization Time Period 04/19/15 - 06/19/15   Authorization - Visit Number 2   Authorization - Number of Visits 10   PT Start Time 0160   PT Stop Time 1100   PT Time Calculation (min) 45 min   Activity Tolerance Patient tolerated treatment well;No increased pain   Behavior During Therapy [facility] for tasks assessed/performed      Past Medical History  Diagnosis Date  . Thyroid disease   . Hypertension   . Hypercholesteremia   . GERD (gastroesophageal reflux disease)   . Hypothyroidism   . Anxiety   . Cancer     basal cell of scalp  . Arthritis   . HOH (hard of hearing)     Past Surgical History  Procedure Laterality Date  . Biopsy thyroid      benign  . Abdominal hysterectomy    . Cholecystectomy    . Appendectomy    . Tonsillectomy    . Cataract extraction w/phaco  12/19/2012    Procedure: CATARACT EXTRACTION PHACO AND INTRAOCULAR LENS PLACEMENT (IOC);  Surgeon: Tonny Branch, MD;  Location: [facility];  Service: Ophthalmology;  Laterality: Left;  CDE 13.75  . Cataract extraction w/phaco Right 01/06/2013    Procedure: CATARACT EXTRACTION PHACO AND INTRAOCULAR LENS PLACEMENT (IOC);  Surgeon: Tonny Branch, MD;  Location: [facility];  Service: Ophthalmology;  Laterality: Right;  CDE: 9.96    There were no vitals filed for this visit.  Visit Diagnosis:  Decreased strength  Decreased range of motion  Decreased functional activity tolerance  Neck pain  Left upper extremity numbness      Subjective Assessment - 04/22/15 1900     Subjective Pt states she has been doing the exercises given at her last visit and is currently with only minimal discomfort   Currently in Pain? Yes   Pain Score 2    Pain Location Arm   Pain Orientation Left                         [facility] Adult PT Treatment/Exercise - 04/22/15 1903    Neck Exercises: Machines for Strengthening   UBE (Upper Arm Bike) 4' backward at level 1   Neck Exercises: Theraband   Scapula Retraction 10 reps;Red   Shoulder Extension 10 reps;Red   Rows 10 reps;Red   Neck Exercises: Standing   Other Standing Exercises corner stretch 3X20"   Other Standing Exercises review of HEP:  shorulder rolls, chair extension, scap retraction   Neck Exercises: Seated   Other Seated Exercise 3D cervical 5X each   Other Seated Exercise upper trap stretch 3X20"                  PT Short Term Goals - 04/19/15 1622    PT SHORT TERM GOAL #1   Title Patient to demonstrate independent postural corrections for decreased strain through the cervical region.    Time 2   Period Weeks   Status New  PT SHORT TERM GOAL #2   Title Patient to be independent with initial band resisted scapular stabilization exercises for home exercise program.    Time 2   Period Weeks   Status New   PT SHORT TERM GOAL #3   Title Patient to report ability to increase her time in the garden for progression of recreational tasks.    Time 3   Period Weeks   Status New           PT Long Term Goals - 04/19/15 1625    PT LONG TERM GOAL #1   Title Patient to rate her pain as less than or equal to 3/10 wtih gardening.    Time 4   Period Weeks   Status New   PT LONG TERM GOAL #2   Title Patient to be independent with scapular stabilization program for continued gains upon D/C from PT services.    Time 4   Period Weeks   Status New   PT LONG TERM GOAL #3   Title Patient to report a reduction in her numbness in the lt upper extremity for performing household chores.     Time 4   Period Weeks   Status New               Plan - 04/22/15 1906    Clinical Impression Statement Reviewed HEP with patient needing only cues with chair extension exericse.  All others with good form.  Added postural theraband exericses, 3D cervical ROM exerxisezs and cornet stretch to POC.  Pt given written instructions.   Pt will benefit from skilled therapeutic intervention in order to improve on the following deficits Decreased activity tolerance;Decreased strength;Improper body mechanics;Impaired UE functional use;Decreased range of motion   Rehab Potential Fair   PT Frequency 2x / week   PT Duration 4 weeks   PT Next Visit Plan Focus on gentle but progressive scapular stabilizaion program, including postural education. Review reading and tablet posture at home.    PT Home Exercise Plan Scapular stabilization and mobilization exercises.    Consulted and Agree with Plan of Care Patient        Problem List Patient Active Problem List   Diagnosis Date Noted  . CVA (cerebral infarction) 10/13/2014  . Arthritis of knee, degenerative 01/29/2014  . Hypothyroid 06/10/2012  . Hypertension   . 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 KNEE PAIN 03/15/2009  . IMPINGEMENT SYNDROME 03/11/2008  . SHOULDER PAIN 10/16/2007  . HIGH BLOOD PRESSURE 10/16/2007    Teena Irani, PTA/CLT [phone]  04/22/2015, 7:08 PM  [facility] [address] [place], [place], [address] Phone: [phone]   Fax:  [phone]

## 2015-04-28 ENCOUNTER — Ambulatory Visit (HOSPITAL_COMMUNITY): Payer: Medicare Other | Admitting: Physical Therapy

## 2015-04-28 DIAGNOSIS — R6889 Other general symptoms and signs: Secondary | ICD-10-CM | POA: Diagnosis not present

## 2015-04-28 DIAGNOSIS — R200 Anesthesia of skin: Secondary | ICD-10-CM

## 2015-04-28 DIAGNOSIS — M6281 Muscle weakness (generalized): Secondary | ICD-10-CM | POA: Diagnosis not present

## 2015-04-28 DIAGNOSIS — M2560 Stiffness of unspecified joint, not elsewhere classified: Secondary | ICD-10-CM | POA: Diagnosis not present

## 2015-04-28 DIAGNOSIS — R208 Other disturbances of skin sensation: Secondary | ICD-10-CM | POA: Diagnosis not present

## 2015-04-28 DIAGNOSIS — M542 Cervicalgia: Secondary | ICD-10-CM

## 2015-04-28 DIAGNOSIS — R531 Weakness: Secondary | ICD-10-CM

## 2015-04-28 NOTE — Therapy (Signed)
[place], [place], [address] Phone: [phone]   Fax:  [phone]  Physical Therapy Treatment  Patient Details  Name: Sheila Briggs MRN: [id number] Date of Birth: 05/08/1932 Referring Provider:  Asencion Noble, MD  Encounter Date: 04/28/2015      PT End of Session - 04/28/15 1750    Visit Number 3   Number of Visits 8   Date for PT Re-Evaluation 05/19/15   Authorization Type Medicare   Authorization Time Period 04/19/15 - 06/19/15   Authorization - Visit Number 3   Authorization - Number of Visits 10   PT Start Time 9678   PT Stop Time 9381   PT Time Calculation (min) 40 min   Activity Tolerance Patient tolerated treatment well;No increased pain   Behavior During Therapy [facility] for tasks assessed/performed      Past Medical History  Diagnosis Date  . Thyroid disease   . Hypertension   . Hypercholesteremia   . GERD (gastroesophageal reflux disease)   . Hypothyroidism   . Anxiety   . Cancer     basal cell of scalp  . Arthritis   . HOH (hard of hearing)     Past Surgical History  Procedure Laterality Date  . Biopsy thyroid      benign  . Abdominal hysterectomy    . Cholecystectomy    . Appendectomy    . Tonsillectomy    . Cataract extraction w/phaco  12/19/2012    Procedure: CATARACT EXTRACTION PHACO AND INTRAOCULAR LENS PLACEMENT (IOC);  Surgeon: Tonny Branch, MD;  Location: [facility];  Service: Ophthalmology;  Laterality: Left;  CDE 13.75  . Cataract extraction w/phaco Right 01/06/2013    Procedure: CATARACT EXTRACTION PHACO AND INTRAOCULAR LENS PLACEMENT (IOC);  Surgeon: Tonny Branch, MD;  Location: [facility];  Service: Ophthalmology;  Laterality: Right;  CDE: 9.96    There were no vitals filed for this visit.  Visit Diagnosis:  Decreased strength  Decreased range of motion  Decreased functional activity tolerance  Neck pain  Left upper extremity numbness      Subjective Assessment - 04/28/15 1522     Subjective Pt states her pain is 3/10 today.  Reports compliance wtih HEP.   Currently in Pain? Yes   Pain Score 3    Pain Location Arm   Pain Orientation Left                         [facility] Adult PT Treatment/Exercise - 04/28/15 1523    Neck Exercises: Machines for Strengthening   UBE (Upper Arm Bike) 4' backward at level 1   Neck Exercises: Theraband   Scapula Retraction 10 reps;Green   Shoulder Extension 10 reps;Green   Rows 10 reps;Green   Neck Exercises: Standing   Wall Push Ups 10 reps   Other Standing Exercises corner stretch 3X20"   Manual Therapy   Manual Therapy Soft tissue mobilization   Manual therapy comments seated to UT bilaterally   Soft tissue mobilization to decresae spasms and increase muscle relaxation                  PT Short Term Goals - 04/19/15 1622    PT SHORT TERM GOAL #1   Title Patient to demonstrate independent postural corrections for decreased strain through the cervical region.    Time 2   Period Weeks   Status New   PT SHORT TERM GOAL #2  Title Patient to be independent with initial band resisted scapular stabilization exercises for home exercise program.    Time 2   Period Weeks   Status New   PT SHORT TERM GOAL #3   Title Patient to report ability to increase her time in the garden for progression of recreational tasks.    Time 3   Period Weeks   Status New           PT Long Term Goals - 04/19/15 1625    PT LONG TERM GOAL #1   Title Patient to rate her pain as less than or equal to 3/10 wtih gardening.    Time 4   Period Weeks   Status New   PT LONG TERM GOAL #2   Title Patient to be independent with scapular stabilization program for continued gains upon D/C from PT services.    Time 4   Period Weeks   Status New   PT LONG TERM GOAL #3   Title Patient to report a reduction in her numbness in the lt upper extremity for performing household chores.    Time 4   Period Weeks   Status New                Plan - 04/28/15 1750    Clinical Impression Statement Continued therex with focus on manual today to decrase spasm and pain.  Pt with spasms bilateral upper trap with Lt>Rt.  Able to resolve 80% by end of session.  Pt requires tactile cues to stabilize correctly with theraband exercises.     PT Next Visit Plan Focus on gentle but progressive scapular stabilizaion program, including postural education. Review reading and tablet posture at home. continue manual as needed.    PT Home Exercise Plan Scapular stabilization and mobilization exercises.    Consulted and Agree with Plan of Care Patient        Problem List Patient Active Problem List   Diagnosis Date Noted  . CVA (cerebral infarction) 10/13/2014  . Arthritis of knee, degenerative 01/29/2014  . Hypothyroid 06/10/2012  . Hypertension   . 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 KNEE PAIN 03/15/2009  . IMPINGEMENT SYNDROME 03/11/2008  . SHOULDER PAIN 10/16/2007  . HIGH BLOOD PRESSURE 10/16/2007    Teena Irani, PTA/CLT [phone]  04/28/2015, 5:53 PM  [facility] [address] [place], [place], [address] Phone: [phone]   Fax:  [phone]

## 2015-05-03 ENCOUNTER — Ambulatory Visit (HOSPITAL_COMMUNITY): Payer: Medicare Other | Admitting: Physical Therapy

## 2015-05-03 DIAGNOSIS — M6281 Muscle weakness (generalized): Secondary | ICD-10-CM | POA: Diagnosis not present

## 2015-05-03 DIAGNOSIS — M542 Cervicalgia: Secondary | ICD-10-CM

## 2015-05-03 DIAGNOSIS — R531 Weakness: Secondary | ICD-10-CM

## 2015-05-03 DIAGNOSIS — R6889 Other general symptoms and signs: Secondary | ICD-10-CM

## 2015-05-03 DIAGNOSIS — M2560 Stiffness of unspecified joint, not elsewhere classified: Secondary | ICD-10-CM | POA: Diagnosis not present

## 2015-05-03 DIAGNOSIS — R208 Other disturbances of skin sensation: Secondary | ICD-10-CM | POA: Diagnosis not present

## 2015-05-03 DIAGNOSIS — R200 Anesthesia of skin: Secondary | ICD-10-CM

## 2015-05-03 NOTE — Therapy (Signed)
[place], [place], [address] Phone: [phone]   Fax:  [phone]  Physical Therapy Treatment  Patient Details  Name: Sheila Briggs MRN: [id number] Date of Birth: [birth year] Referring Provider:  Asencion Noble, MD  Encounter Date: 05/03/2015      PT End of Session - 05/03/15 1434    Visit Number 4   Number of Visits 8   Date for PT Re-Evaluation 05/19/15   Authorization Type Medicare   Authorization Time Period 04/19/15 - 06/19/15   Authorization - Visit Number 4   Authorization - Number of Visits 10   PT Start Time 0962   PT Stop Time 1428   PT Time Calculation (min) 40 min   Activity Tolerance Patient tolerated treatment well   Behavior During Therapy [facility] for tasks assessed/performed      Past Medical History  Diagnosis Date  . Thyroid disease   . Hypertension   . Hypercholesteremia   . GERD (gastroesophageal reflux disease)   . Hypothyroidism   . Anxiety   . Cancer     basal cell of scalp  . Arthritis   . HOH (hard of hearing)     Past Surgical History  Procedure Laterality Date  . Biopsy thyroid      benign  . Abdominal hysterectomy    . Cholecystectomy    . Appendectomy    . Tonsillectomy    . Cataract extraction w/phaco  12/19/2012    Procedure: CATARACT EXTRACTION PHACO AND INTRAOCULAR LENS PLACEMENT (IOC);  Surgeon: Tonny Branch, MD;  Location: [facility];  Service: Ophthalmology;  Laterality: Left;  CDE 13.75  . Cataract extraction w/phaco Right 01/06/2013    Procedure: CATARACT EXTRACTION PHACO AND INTRAOCULAR LENS PLACEMENT (IOC);  Surgeon: Tonny Branch, MD;  Location: [facility];  Service: Ophthalmology;  Laterality: Right;  CDE: 9.96    There were no vitals filed for this visit.  Visit Diagnosis:  Decreased strength  Decreased range of motion  Decreased functional activity tolerance  Neck pain  Left upper extremity numbness      Subjective Assessment - 05/03/15 1351    Subjective  Patient reports she is doint well and that her pain is around 3/10 today, had a good weekend    Currently in Pain? Yes   Pain Score 3    Pain Location --  sometimes neck and sometimes arm                          [facility] Adult PT Treatment/Exercise - 05/03/15 0001    Neck Exercises: Theraband   Scapula Retraction 15 reps   Scapula Retraction Limitations 2 second holds    Other Theraband Exercises Punches 1x10 each side   manual cues for scapular protraction    Neck Exercises: Standing   Wall Push Ups 15 reps   Other Standing Exercises Shoulder shrugs with 2#, 1x10   Other Standing Exercises Shoulder flexion, ABD, extension 1x10 with 2# weights 1x10   Neck Exercises: Seated   Neck Retraction 10 reps   Neck Retraction Limitations cues for form    Other Seated Exercise 3D cervical and thoracic excursions 1x10   Manual Therapy   Manual Therapy Soft tissue mobilization   Manual therapy comments seated to UT bilaterally   Soft tissue mobilization to decresae spasms and increase muscle relaxation   Neck Exercises: Stretches   Upper Trapezius Stretch 3 reps;30 seconds   Corner Stretch 3  reps;30 seconds                PT Education - 05/03/15 1434    Education provided No          PT Short Term Goals - 04/19/15 1622    PT SHORT TERM GOAL #1   Title Patient to demonstrate independent postural corrections for decreased strain through the cervical region.    Time 2   Period Weeks   Status New   PT SHORT TERM GOAL #2   Title Patient to be independent with initial band resisted scapular stabilization exercises for home exercise program.    Time 2   Period Weeks   Status New   PT SHORT TERM GOAL #3   Title Patient to report ability to increase her time in the garden for progression of recreational tasks.    Time 3   Period Weeks   Status New           PT Long Term Goals - 04/19/15 1625    PT LONG TERM GOAL #1   Title Patient to rate her pain as  less than or equal to 3/10 wtih gardening.    Time 4   Period Weeks   Status New   PT LONG TERM GOAL #2   Title Patient to be independent with scapular stabilization program for continued gains upon D/C from PT services.    Time 4   Period Weeks   Status New   PT LONG TERM GOAL #3   Title Patient to report a reduction in her numbness in the lt upper extremity for performing household chores.    Time 4   Period Weeks   Status New               Plan - 05/03/15 1435    Clinical Impression Statement Continued functional stretches, ther ex, and manual treatments today to improve stability and reduce spasm and pain. Patient did require occasional tactile cues for appropraite muscle recruitment and facilitation of appropriate activation patterns. Pain reduced from 3/10 to 2/10 at end of session.    Pt will benefit from skilled therapeutic intervention in order to improve on the following deficits Decreased activity tolerance;Decreased strength;Improper body mechanics;Impaired UE functional use;Decreased range of motion   Rehab Potential Fair   PT Frequency 2x / week   PT Duration 4 weeks   PT Treatment/Interventions Therapeutic exercise;Manual techniques;Therapeutic activities;Patient/family education;Ultrasound;Electrical Stimulation   PT Next Visit Plan Focus on gentle but progressive scapular stabilizaion program, including postural education. Review reading and tablet posture at home. continue manual as needed.    PT Home Exercise Plan Scapular stabilization and mobilization exercises.    Consulted and Agree with Plan of Care Patient        Problem List Patient Active Problem List   Diagnosis Date Noted  . CVA (cerebral infarction) 10/13/2014  . Arthritis of knee, degenerative 01/29/2014  . Hypothyroid 06/10/2012  . Hypertension   . 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 KNEE PAIN 03/15/2009  . IMPINGEMENT SYNDROME 03/11/2008  . SHOULDER PAIN 10/16/2007  . HIGH BLOOD  PRESSURE 10/16/2007   Deniece Ree PT, DPT [place] [address] [place], [place], [address] Phone: [phone]   Fax:  [phone]

## 2015-05-06 ENCOUNTER — Ambulatory Visit (HOSPITAL_COMMUNITY): Payer: Medicare Other

## 2015-05-06 DIAGNOSIS — R531 Weakness: Secondary | ICD-10-CM

## 2015-05-06 DIAGNOSIS — M6281 Muscle weakness (generalized): Secondary | ICD-10-CM | POA: Diagnosis not present

## 2015-05-06 DIAGNOSIS — R208 Other disturbances of skin sensation: Secondary | ICD-10-CM | POA: Diagnosis not present

## 2015-05-06 DIAGNOSIS — R200 Anesthesia of skin: Secondary | ICD-10-CM

## 2015-05-06 DIAGNOSIS — M542 Cervicalgia: Secondary | ICD-10-CM | POA: Diagnosis not present

## 2015-05-06 DIAGNOSIS — M2560 Stiffness of unspecified joint, not elsewhere classified: Secondary | ICD-10-CM | POA: Diagnosis not present

## 2015-05-06 DIAGNOSIS — R6889 Other general symptoms and signs: Secondary | ICD-10-CM | POA: Diagnosis not present

## 2015-05-06 NOTE — Therapy (Signed)
[place], [place], [address] Phone: [phone]   Fax:  [phone]  Physical Therapy Treatment  Patient Details  Name: Sheila Briggs MRN: [id number] Date of Birth: 1932-04-15 Referring Provider:  Asencion Noble, MD  Encounter Date: 05/06/2015      PT End of Session - 05/06/15 1524    Visit Number 5   Number of Visits 8   Date for PT Re-Evaluation 05/19/15   Authorization Type Medicare   Authorization Time Period 04/19/15 - 06/19/15   Authorization - Visit Number 5   Authorization - Number of Visits 10   PT Start Time 0277   PT Stop Time 1600   PT Time Calculation (min) 42 min   Activity Tolerance Patient tolerated treatment well   Behavior During Therapy [facility] for tasks assessed/performed      Past Medical History  Diagnosis Date  . Thyroid disease   . Hypertension   . Hypercholesteremia   . GERD (gastroesophageal reflux disease)   . Hypothyroidism   . Anxiety   . Cancer     basal cell of scalp  . Arthritis   . HOH (hard of hearing)     Past Surgical History  Procedure Laterality Date  . Biopsy thyroid      benign  . Abdominal hysterectomy    . Cholecystectomy    . Appendectomy    . Tonsillectomy    . Cataract extraction w/phaco  12/19/2012    Procedure: CATARACT EXTRACTION PHACO AND INTRAOCULAR LENS PLACEMENT (IOC);  Surgeon: Tonny Branch, MD;  Location: [facility];  Service: Ophthalmology;  Laterality: Left;  CDE 13.75  . Cataract extraction w/phaco Right 01/06/2013    Procedure: CATARACT EXTRACTION PHACO AND INTRAOCULAR LENS PLACEMENT (IOC);  Surgeon: Tonny Branch, MD;  Location: [facility];  Service: Ophthalmology;  Laterality: Right;  CDE: 9.96    There were no vitals filed for this visit.  Visit Diagnosis:  Decreased strength  Decreased range of motion  Decreased functional activity tolerance  Neck pain  Left upper extremity numbness      Subjective Assessment - 05/06/15 1519    Subjective Pt  stated increased pain today on Lt upper trap region, pain scale 4-5/10 with radicular symptoms down Lt UE to fingertips.  Reports compliance with HEP 2x daily   Currently in Pain? Yes   Pain Score 5    Pain Location Neck  neck and Lt UE   Pain Orientation Left   Pain Descriptors / Indicators Numbness;Burning   Pain Radiating Towards Numbness down Lt UE to fingertips             [facility] Adult PT Treatment/Exercise - 05/06/15 0001    Neck Exercises: Theraband   Scapula Retraction 15 reps;Green   Shoulder Extension 10 reps;Green   Rows 15 reps;Green   Other Theraband Exercises Punches 1x10 each side    Neck Exercises: Standing   Wall Push Ups 15 reps   Wall Push Ups Limitations cueing for form   Other Standing Exercises Shoulder flexion, ABD, extension 1x10 with 2# weights 1x10   Neck Exercises: Seated   Neck Retraction 10 reps   Neck Retraction Limitations cues for form    Other Seated Exercise 3D cervical and thoracic excursions 1x10   Manual Therapy   Manual Therapy Soft tissue mobilization   Manual therapy comments supine with LE elevated to UT Bil, position release complete Bil UT   Soft tissue mobilization to decresae spasms and increase  muscle relaxation   Neck Exercises: Stretches   Upper Trapezius Stretch 3 reps;30 seconds   Corner Stretch 3 reps;30 seconds                  PT Short Term Goals - 05/06/15 1535    PT SHORT TERM GOAL #1   Title Patient to demonstrate independent postural corrections for decreased strain through the cervical region.    Status On-going   PT SHORT TERM GOAL #2   Title Patient to be independent with initial band resisted scapular stabilization exercises for home exercise program.    Status On-going   PT SHORT TERM GOAL #3   Title Patient to report ability to increase her time in the garden for progression of recreational tasks.            PT Long Term Goals - 05/06/15 1537    PT LONG TERM GOAL #1   Title Patient to rate  her pain as less than or equal to 3/10 wtih gardening.    Status On-going   PT LONG TERM GOAL #2   Title Patient to be independent with scapular stabilization program for continued gains upon D/C from PT services.    PT LONG TERM GOAL #3   Title Patient to report a reduction in her numbness in the lt upper extremity for performing household chores.                Plan - 05/06/15 1547    Clinical Impression Statement Session focus on improving scapular stabilization and education on importance of proper posture, therapist facilitation to improve form with scapular stability exercises and to improve form with retraction.  Ended session with manual techniques to reduce spasm and pain in Upper trap region.  Utilized position release technique to reduce upper trap spasms Bil with noted improved cervical rotation and reports of pain reduced following.  Pt encouraged to stay hydrated to reduce risk of headache following manual.     PT Next Visit Plan Focus on gentle but progressive scapular stabilizaion program, including postural education. Review reading and tablet posture at home. continue manual as needed.         Problem List Patient Active Problem List   Diagnosis Date Noted  . CVA (cerebral infarction) 10/13/2014  . Arthritis of knee, degenerative 01/29/2014  . Hypothyroid 06/10/2012  . Hypertension   . 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 KNEE PAIN 03/15/2009  . IMPINGEMENT SYNDROME 03/11/2008  . SHOULDER PAIN 10/16/2007  . HIGH BLOOD PRESSURE 10/16/2007   Aldona Lento, PTA  Aldona Lento 05/06/2015, 4:22 PM  [place] [address] [place], [place], [address] Phone: [phone]   Fax:  [phone]

## 2015-05-10 ENCOUNTER — Ambulatory Visit (HOSPITAL_COMMUNITY): Payer: Medicare Other | Admitting: Physical Therapy

## 2015-05-10 DIAGNOSIS — M2560 Stiffness of unspecified joint, not elsewhere classified: Secondary | ICD-10-CM | POA: Diagnosis not present

## 2015-05-10 DIAGNOSIS — M6281 Muscle weakness (generalized): Secondary | ICD-10-CM | POA: Diagnosis not present

## 2015-05-10 DIAGNOSIS — R6889 Other general symptoms and signs: Secondary | ICD-10-CM | POA: Diagnosis not present

## 2015-05-10 DIAGNOSIS — R208 Other disturbances of skin sensation: Secondary | ICD-10-CM | POA: Diagnosis not present

## 2015-05-10 DIAGNOSIS — R531 Weakness: Secondary | ICD-10-CM

## 2015-05-10 DIAGNOSIS — R200 Anesthesia of skin: Secondary | ICD-10-CM

## 2015-05-10 DIAGNOSIS — M542 Cervicalgia: Secondary | ICD-10-CM | POA: Diagnosis not present

## 2015-05-10 NOTE — Therapy (Signed)
Sheila Brig[place], [place], [address] Phone: [phone]   Fax:  [phone]  Physical Therapy Treatment  Patient Details  Name: Sheila Briggs MRN: [id number] Date of Birth: Jan 14, 1932 Referring Provider:  Carole Civil, MD  Encounter Date: 05/10/2015      PT End of Session - 05/10/15 1807    Visit Number 6   Number of Visits 8   Date for PT Re-Evaluation 05/19/15   Authorization Type Medicare   Authorization Time Period 04/19/15 - 06/19/15   Authorization - Visit Number 6   Authorization - Number of Visits 10   PT Start Time 5916   PT Stop Time 1558   PT Time Calculation (min) 40 min   Activity Tolerance Patient tolerated treatment well   Behavior During Therapy [facility] for tasks assessed/performed      Past Medical History  Diagnosis Date  . Thyroid disease   . Hypertension   . Hypercholesteremia   . GERD (gastroesophageal reflux disease)   . Hypothyroidism   . Anxiety   . Cancer     basal cell of scalp  . Arthritis   . HOH (hard of hearing)     Past Surgical History  Procedure Laterality Date  . Biopsy thyroid      benign  . Abdominal hysterectomy    . Cholecystectomy    . Appendectomy    . Tonsillectomy    . Cataract extraction w/phaco  12/19/2012    Procedure: CATARACT EXTRACTION PHACO AND INTRAOCULAR LENS PLACEMENT (IOC);  Surgeon: Tonny Branch, MD;  Location: [facility];  Service: Ophthalmology;  Laterality: Left;  CDE 13.75  . Cataract extraction w/phaco Right 01/06/2013    Procedure: CATARACT EXTRACTION PHACO AND INTRAOCULAR LENS PLACEMENT (IOC);  Surgeon: Tonny Branch, MD;  Location: [facility];  Service: Ophthalmology;  Laterality: Right;  CDE: 9.96    There were no vitals filed for this visit.  Visit Diagnosis:  Decreased strength  Decreased range of motion  Decreased functional activity tolerance  Neck pain  Left upper extremity numbness      Subjective Assessment - 05/10/15 1804    Subjective Pt states pain varies and has time of being painfree, however never goes away.                           [facility] Adult PT Treatment/Exercise - 05/10/15 1531    Neck Exercises: Machines for Strengthening   UBE (Upper Arm Bike) 4' backward at level 1   Neck Exercises: Theraband   Scapula Retraction 15 reps;Green   Shoulder Extension 15 reps;Green   Rows 15 reps;Green   Other Theraband Exercises Punches 1x10 each side    Neck Exercises: Standing   Wall Push Ups 15 reps   Wall Push Ups Limitations cueing for form   Other Standing Exercises Shoulder flexion, ABD, extension 1x10 with 3# weights   Neck Exercises: Seated   Neck Retraction 10 reps   Manual Therapy   Manual Therapy Soft tissue mobilization   Manual therapy comments seated to UT Lt>RT   Soft tissue mobilization to decresae spasms and increase muscle relaxation                  PT Short Term Goals - 05/06/15 1535    PT SHORT TERM GOAL #1   Title Patient to demonstrate independent postural corrections for decreased strain through the cervical region.    Status On-going  PT SHORT TERM GOAL #2   Title Patient to be independent with initial band resisted scapular stabilization exercises for home exercise program.    Status On-going   PT SHORT TERM GOAL #3   Title Patient to report ability to increase her time in the garden for progression of recreational tasks.            PT Long Term Goals - 05/06/15 1537    PT LONG TERM GOAL #1   Title Patient to rate her pain as less than or equal to 3/10 wtih gardening.    Status On-going   PT LONG TERM GOAL #2   Title Patient to be independent with scapular stabilization program for continued gains upon D/C from PT services.    PT LONG TERM GOAL #3   Title Patient to report a reduction in her numbness in the lt upper extremity for performing household chores.                Plan - 05/10/15 1804    Clinical Impression Statement  Continued focus on improving scapular stabilization and posture while reducing pain and dysfunction.  Manual completed for bilateral traps, however 75% spent on Lt due to large spasm in upper trap region.  Pt reported no pain at end of session.     PT Next Visit Plan Focus on gentle but progressive scapular stabilizaion program, including postural education. Review reading and tablet posture at home. continue manual as needed.         Problem List Patient Active Problem List   Diagnosis Date Noted  . CVA (cerebral infarction) 10/13/2014  . Arthritis of knee, degenerative 01/29/2014  . Hypothyroid 06/10/2012  . Hypertension   . 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 KNEE PAIN 03/15/2009  . IMPINGEMENT SYNDROME 03/11/2008  . SHOULDER PAIN 10/16/2007  . HIGH BLOOD PRESSURE 10/16/2007    Sheila Briggs, PTA/CLT [phone]  05/10/2015, 6:08 PM  [place] [address] [place], [place], [address] Phone: [phone]   Fax:  [phone]

## 2015-05-12 ENCOUNTER — Ambulatory Visit (HOSPITAL_COMMUNITY): Payer: Medicare Other | Admitting: Physical Therapy

## 2015-05-12 DIAGNOSIS — R531 Weakness: Secondary | ICD-10-CM

## 2015-05-12 DIAGNOSIS — M542 Cervicalgia: Secondary | ICD-10-CM | POA: Diagnosis not present

## 2015-05-12 DIAGNOSIS — M2560 Stiffness of unspecified joint, not elsewhere classified: Secondary | ICD-10-CM

## 2015-05-12 DIAGNOSIS — R6889 Other general symptoms and signs: Secondary | ICD-10-CM | POA: Diagnosis not present

## 2015-05-12 DIAGNOSIS — R208 Other disturbances of skin sensation: Secondary | ICD-10-CM | POA: Diagnosis not present

## 2015-05-12 DIAGNOSIS — M6281 Muscle weakness (generalized): Secondary | ICD-10-CM | POA: Diagnosis not present

## 2015-05-12 NOTE — Therapy (Signed)
[place], [place], [address] Phone: [phone]   Fax:  [phone]  Physical Therapy Treatment  Patient Details  Name: Sheila Briggs MRN: [id number] Date of Birth: 1932/06/30 Referring Provider:  Carole Civil, MD  Encounter Date: 05/12/2015      PT End of Session - 05/12/15 1621    Visit Number 7   Number of Visits 8   Date for PT Re-Evaluation 05/19/15   Authorization Type Medicare   Authorization Time Period 04/19/15 - 06/19/15   Authorization - Visit Number 7   Authorization - Number of Visits 10   PT Start Time 0092   PT Stop Time 1514   PT Time Calculation (min) 40 min   Activity Tolerance Patient tolerated treatment well   Behavior During Therapy [facility] for tasks assessed/performed      Past Medical History  Diagnosis Date  . Thyroid disease   . Hypertension   . Hypercholesteremia   . GERD (gastroesophageal reflux disease)   . Hypothyroidism   . Anxiety   . Cancer     basal cell of scalp  . Arthritis   . HOH (hard of hearing)     Past Surgical History  Procedure Laterality Date  . Biopsy thyroid      benign  . Abdominal hysterectomy    . Cholecystectomy    . Appendectomy    . Tonsillectomy    . Cataract extraction w/phaco  12/19/2012    Procedure: CATARACT EXTRACTION PHACO AND INTRAOCULAR LENS PLACEMENT (IOC);  Surgeon: Tonny Branch, MD;  Location: [facility];  Service: Ophthalmology;  Laterality: Left;  CDE 13.75  . Cataract extraction w/phaco Right 01/06/2013    Procedure: CATARACT EXTRACTION PHACO AND INTRAOCULAR LENS PLACEMENT (IOC);  Surgeon: Tonny Branch, MD;  Location: [facility];  Service: Ophthalmology;  Laterality: Right;  CDE: 9.96    There were no vitals filed for this visit.  Visit Diagnosis:  Decreased strength  Decreased range of motion  Decreased functional activity tolerance  Neck pain      Subjective Assessment - 05/12/15 1440    Subjective Patient reports she is diong  well today, no pain this afternoon, just stiffness, but does state that she was having some pain and discomfort this morning    Currently in Pain? No/denies                         [facility] Adult PT Treatment/Exercise - 05/12/15 0001    Neck Exercises: Theraband   Scapula Retraction 10 reps;Blue   Scapula Retraction Limitations blue TB    Shoulder Extension 10 reps   Shoulder Extension Limitations blue TB    Neck Exercises: Standing   Wall Push Ups 15 reps   Wall Push Ups Limitations cueing for form   Neck Exercises: Seated   Shoulder ABduction Both;10 reps   Shoulder Abduction Limitations Red TB    Other Seated Exercise 3D cervical and thoracic excursions 1x10   Other Seated Exercise Shoulder ER with red TB 1x10; chair pushups with focus on UEs 1x10   Manual Therapy   Manual Therapy Soft tissue mobilization   Manual therapy comments seated to UT Lt>RT   Soft tissue mobilization to decresae spasms and increase muscle relaxation   Neck Exercises: Stretches   Upper Trapezius Stretch 3 reps;30 seconds   Corner Stretch --                PT  Education - 05/12/15 1620    Education provided No          PT Short Term Goals - 05/06/15 1535    PT SHORT TERM GOAL #1   Title Patient to demonstrate independent postural corrections for decreased strain through the cervical region.    Status On-going   PT SHORT TERM GOAL #2   Title Patient to be independent with initial band resisted scapular stabilization exercises for home exercise program.    Status On-going   PT SHORT TERM GOAL #3   Title Patient to report ability to increase her time in the garden for progression of recreational tasks.            PT Long Term Goals - 05/06/15 1537    PT LONG TERM GOAL #1   Title Patient to rate her pain as less than or equal to 3/10 wtih gardening.    Status On-going   PT LONG TERM GOAL #2   Title Patient to be independent with scapular stabilization program for  continued gains upon D/C from PT services.    PT LONG TERM GOAL #3   Title Patient to report a reduction in her numbness in the lt upper extremity for performing household chores.                Plan - 05/12/15 1621    Clinical Impression Statement Continued functional stretches, mobility exercises, and functional strengthening for shoulder and scapular musculature. Introduced new shoulder/scapular exercises today with frequent cues for form and patient did display some weakness while attempting new activities. Finished session with manual to bilateral upper trap musculature due to tightness and muscle knotting. Patient reported feeling much better at end of session.    Pt will benefit from skilled therapeutic intervention in order to improve on the following deficits Decreased activity tolerance;Decreased strength;Improper body mechanics;Impaired UE functional use;Decreased range of motion   Rehab Potential Fair   PT Frequency 2x / week   PT Duration 4 weeks   PT Treatment/Interventions Therapeutic exercise;Manual techniques;Therapeutic activities;Patient/family education;Ultrasound;Electrical Stimulation   PT Next Visit Plan Focus on gentle but progressive scapular stabilizaion program, including postural education. Review reading and tablet posture at home. continue manual as needed. Trial dowel scapular exercises.    PT Home Exercise Plan Scapular stabilization and mobilization exercises.    Consulted and Agree with Plan of Care Patient        Problem List Patient Active Problem List   Diagnosis Date Noted  . CVA (cerebral infarction) 10/13/2014  . Arthritis of knee, degenerative 01/29/2014  . Hypothyroid 06/10/2012  . Hypertension   . 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 KNEE PAIN 03/15/2009  . IMPINGEMENT SYNDROME 03/11/2008  . SHOULDER PAIN 10/16/2007  . HIGH BLOOD PRESSURE 10/16/2007    Deniece Ree PT, DPT [place] [address] [place], [place], [address] Phone: [phone]   Fax:  [phone]

## 2015-05-19 ENCOUNTER — Ambulatory Visit (HOSPITAL_COMMUNITY): Payer: Medicare Other | Attending: Orthopedic Surgery | Admitting: Physical Therapy

## 2015-05-19 DIAGNOSIS — R200 Anesthesia of skin: Secondary | ICD-10-CM

## 2015-05-19 DIAGNOSIS — R208 Other disturbances of skin sensation: Secondary | ICD-10-CM | POA: Insufficient documentation

## 2015-05-19 DIAGNOSIS — M2560 Stiffness of unspecified joint, not elsewhere classified: Secondary | ICD-10-CM | POA: Diagnosis not present

## 2015-05-19 DIAGNOSIS — R6889 Other general symptoms and signs: Secondary | ICD-10-CM | POA: Diagnosis not present

## 2015-05-19 DIAGNOSIS — R531 Weakness: Secondary | ICD-10-CM

## 2015-05-19 DIAGNOSIS — M542 Cervicalgia: Secondary | ICD-10-CM

## 2015-05-19 DIAGNOSIS — M6281 Muscle weakness (generalized): Secondary | ICD-10-CM | POA: Insufficient documentation

## 2015-05-19 NOTE — Therapy (Signed)
[facility] [address] [place], [place], [address] Phone: [phone]   Fax:  [phone]  Physical Therapy Treatment (Re-Assess/Discharge)  Patient Details  Name: Sheila Briggs MRN: [id number] Date of Birth: 1932/05/25 Referring Provider:  Carole Civil, MD  Encounter Date: 05/19/2015      PT End of Session - 05/19/15 1603    Visit Number 8   Number of Visits 8   Date for PT Re-Evaluation 05/19/15   Authorization Type Medicare   Authorization Time Period 04/19/15 - 06/19/15   Authorization - Visit Number 8   Authorization - Number of Visits 10   PT Start Time 1512   PT Stop Time 1600   PT Time Calculation (min) 48 min   Activity Tolerance Patient tolerated treatment well   Behavior During Therapy [facility] for tasks assessed/performed      Past Medical History  Diagnosis Date  . Thyroid disease   . Hypertension   . Hypercholesteremia   . GERD (gastroesophageal reflux disease)   . Hypothyroidism   . Anxiety   . Cancer     basal cell of scalp  . Arthritis   . HOH (hard of hearing)     Past Surgical History  Procedure Laterality Date  . Biopsy thyroid      benign  . Abdominal hysterectomy    . Cholecystectomy    . Appendectomy    . Tonsillectomy    . Cataract extraction w/phaco  12/19/2012    Procedure: CATARACT EXTRACTION PHACO AND INTRAOCULAR LENS PLACEMENT (IOC);  Surgeon: Tonny Branch, MD;  Location: [facility];  Service: Ophthalmology;  Laterality: Left;  CDE 13.75  . Cataract extraction w/phaco Right 01/06/2013    Procedure: CATARACT EXTRACTION PHACO AND INTRAOCULAR LENS PLACEMENT (IOC);  Surgeon: Tonny Branch, MD;  Location: [facility];  Service: Ophthalmology;  Laterality: Right;  CDE: 9.96    There were no vitals filed for this visit.  Visit Diagnosis:  Decreased strength  Decreased range of motion  Decreased functional activity tolerance  Neck pain  Left upper extremity numbness      Subjective Assessment -  05/19/15 1514    Subjective patient reports that her L UE is a little sore recently, had to take a tylenol this morning for it             [facility] PT Assessment - 05/19/15 0001    Observation/Other Assessments   Focus on Therapeutic Outcomes (FOTO)  48% limited    AROM   Cervical Flexion 30   Cervical Extension 30   Cervical - Right Side Bend 25   Cervical - Left Side Bend 20   Cervical - Right Rotation 60   Cervical - Left Rotation 42   Strength   Overall Strength Comments cervical strength remains 4+/5 on L and 4/5 to 4+/5 on R; L UE approx 2/3 of a grade weaker than R    Palpation   Palpation comment pain at levator scap distal attachmnet; upper traps guarded but not painful                      [facility] Adult PT Treatment/Exercise - 05/19/15 0001    Neck Exercises: Standing   Wall Push Ups 15 reps   Wall Push Ups Limitations cues for form    Other Standing Exercises Shoulder retraction, shoulder extension, bicep curls 1x15   Neck Exercises: Seated   Other Seated Exercise 3D cervical and thoracic excursions 1x10  Other Seated Exercise Posterior shoulder rolls 1x20   Neck Exercises: Stretches   Upper Trapezius Stretch 3 reps;60 seconds   Corner Stretch 3 reps;30 seconds                PT Education - 2015/05/29 1602    Education provided Yes   Education Details progress with skilled PT services, updated HEP for home management    Person(s) Educated Patient   Methods Explanation;Demonstration   Comprehension Verbalized understanding;Returned demonstration          PT Short Term Goals - May 29, 2015 1539    PT SHORT TERM GOAL #1   Title Patient to demonstrate independent postural corrections for decreased strain through the cervical region.    Time 2   Period Weeks   Status On-going   PT SHORT TERM GOAL #2   Title Patient to be independent with initial band resisted scapular stabilization exercises for home exercise program.    Time 2   Period Weeks    Status Achieved   PT SHORT TERM GOAL #3   Title Patient to report ability to increase her time in the garden for progression of recreational tasks.    Baseline patient has been limited by heat more than pain in her neck; however she states she feels like she could get out there longer    Time 3   Period Weeks   Status On-going           PT Long Term Goals - May 29, 2015 1542    PT LONG TERM GOAL #1   Title Patient to rate her pain as less than or equal to 3/10 wtih gardening.    Time 4   Period Weeks   Status Achieved   PT LONG TERM GOAL #2   Title Patient to be independent with scapular stabilization program for continued gains upon D/C from PT services.    Time 4   Period Weeks   Status On-going   PT LONG TERM GOAL #3   Title Patient to report a reduction in her numbness in the lt upper extremity for performing household chores.    Time 4   Period Weeks   Status On-going               Plan - May 29, 2015 1604    Clinical Impression Statement Re-assessment performed today. Patient demonstrates improved ROM but continues to demonstrate cervical and UE weakness, muscle tightness in upper traps and levator scapulae, postural impairment,and pain in her neck. At this time patient states that she would like to be discharged from skilled PT services and would like to try managing her condition at home. Updated and modified HEP and provided pateint with therband. Patient states that she is seeing a pain management MD next week and may return to PT if that MD feels it is appropriate, but at this time would like to be discharged.    Pt will benefit from skilled therapeutic intervention in order to improve on the following deficits Decreased activity tolerance;Decreased strength;Improper body mechanics;Impaired UE functional use;Decreased range of motion   Rehab Potential Fair   PT Frequency 2x / week   PT Duration 4 weeks   PT Treatment/Interventions Therapeutic exercise;Manual  techniques;Therapeutic activities;Patient/family education;Ultrasound;Electrical Stimulation   PT Next Visit Plan DC today    PT Home Exercise Plan Scapular stabilization and mobilization exercises.    Consulted and Agree with Plan of Care Patient          G-Codes - 05/29/15 1607  Functional Assessment Tool Used FOTO 48% limited and skilled clinical judgement    Functional Limitation Carrying, moving and handling objects   Carrying, Moving and Handling Objects Goal Status (S2998) At least 1 percent but less than 20 percent impaired, limited or restricted   Carrying, Moving and Handling Objects Discharge Status [phone]) At least 20 percent but less than 40 percent impaired, limited or restricted      Problem List Patient Active Problem List   Diagnosis Date Noted  . CVA (cerebral infarction) 10/13/2014  . Arthritis of knee, degenerative 01/29/2014  . Hypothyroid 06/10/2012  . Hypertension   . 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 KNEE PAIN 03/15/2009  . IMPINGEMENT SYNDROME 03/11/2008  . SHOULDER PAIN 10/16/2007  . HIGH BLOOD PRESSURE 10/16/2007   PHYSICAL THERAPY DISCHARGE SUMMARY  Visits from Start of Care: 8  Current functional level related to goals / functional outcomes: Cervical ROM has improved however patient continues to have cervical pain, muscle tightness, reduced functional task performance, and postural impairments; patient requests that she be discharged today for home management.    Remaining deficits: Reduced cervical ROM, postural impairment, reduced fucntional task performance, muscle tightness    Education / Equipment: Updated HEP, home management  Plan: Patient agrees to discharge.  Patient goals were partially met. Patient is being discharged due to the patient's request.  ?????        Deniece Ree PT, DPT [place], [place], [address] Phone: [phone]    Fax:  [phone]

## 2015-05-19 NOTE — Telephone Encounter (Signed)
APPT 05/21/15 10:30 W/ DR Lyla Son

## 2015-05-19 NOTE — Patient Instructions (Signed)
   SCAPULAR RETRACTIONS (WITH BAND)  Draw your shoulder blades back and down. Repeat 15 times with therband.     AROM SHOULDER EXTENSION (WITH THERBAND)  With your affected arm starting at your side, draw your arm back behind your waist. Keep your elbows straight. Repeat 15 times with therband.    Wall pushup  Stand adjacent to a wall.  Lean against the wall, with arms extended, so that your body is at a slight angle.  Bend arms until your nose is about 2 inches away from the wall, then press out into full extension until shoulders are rounded. Repeat 15 times.     SHOULDER ROLLS  Move your shoulders in a circular pattern as shown so that your are moving in an up, back and down direction. Perform small cicles if needed for comfort. Go backwards only. Repeat 20 times.     ELASTIC BAND BICEPS CURLS  With your arm at your side holding an elastic band, draw up your hand by bending at the elbow.   Keep your palm face up the entire time. Repeat 10 times each arm.    AROM: Neck Flexion   Bend head forward.  Repeat _15___ times per set. Do __1__ sets per session. Do __2__ sessions per day.  http://orth.exer.us/299   Copyright  VHI. All rights reserved.   AROM: Neck Rotation   Turn head slowly to look over one shoulder, then the other.  Repeat __15__ times per set. Do _1___ sets per session. Do _2___ sessions per day.  http://orth.exer.us/295   Copyright  VHI. All rights reserved.  AROM: Lateral Neck Flexion   Slowly tilt head toward one shoulder, then the other.  Repeat _15___ times per set. Do _1___ sets per session. Do __2__ sessions per day.  http://orth.exer.us/297   Copyright  VHI. All rights reserved.

## 2015-05-21 ENCOUNTER — Encounter (HOSPITAL_COMMUNITY): Payer: Medicare Other

## 2015-05-24 ENCOUNTER — Encounter (HOSPITAL_COMMUNITY): Payer: Medicare Other | Admitting: Physical Therapy

## 2015-05-26 ENCOUNTER — Encounter (HOSPITAL_COMMUNITY): Payer: Medicare Other | Admitting: Physical Therapy

## 2015-05-31 ENCOUNTER — Encounter (HOSPITAL_COMMUNITY): Payer: Medicare Other | Admitting: Physical Therapy

## 2015-06-01 DIAGNOSIS — M47812 Spondylosis without myelopathy or radiculopathy, cervical region: Secondary | ICD-10-CM | POA: Diagnosis not present

## 2015-06-01 DIAGNOSIS — M9981 Other biomechanical lesions of cervical region: Secondary | ICD-10-CM | POA: Diagnosis not present

## 2015-06-01 DIAGNOSIS — M5030 Other cervical disc degeneration, unspecified cervical region: Secondary | ICD-10-CM | POA: Diagnosis not present

## 2015-06-28 DIAGNOSIS — I2510 Atherosclerotic heart disease of native coronary artery without angina pectoris: Secondary | ICD-10-CM | POA: Diagnosis not present

## 2015-06-28 DIAGNOSIS — Z79899 Other long term (current) drug therapy: Secondary | ICD-10-CM | POA: Diagnosis not present

## 2015-06-28 DIAGNOSIS — K219 Gastro-esophageal reflux disease without esophagitis: Secondary | ICD-10-CM | POA: Diagnosis not present

## 2015-06-28 DIAGNOSIS — I10 Essential (primary) hypertension: Secondary | ICD-10-CM | POA: Diagnosis not present

## 2015-06-28 DIAGNOSIS — E785 Hyperlipidemia, unspecified: Secondary | ICD-10-CM | POA: Diagnosis not present

## 2015-06-28 DIAGNOSIS — E039 Hypothyroidism, unspecified: Secondary | ICD-10-CM | POA: Diagnosis not present

## 2015-07-01 DIAGNOSIS — G8929 Other chronic pain: Secondary | ICD-10-CM | POA: Diagnosis not present

## 2015-07-01 DIAGNOSIS — E785 Hyperlipidemia, unspecified: Secondary | ICD-10-CM | POA: Diagnosis not present

## 2015-07-01 DIAGNOSIS — F329 Major depressive disorder, single episode, unspecified: Secondary | ICD-10-CM | POA: Diagnosis not present

## 2015-07-01 DIAGNOSIS — E039 Hypothyroidism, unspecified: Secondary | ICD-10-CM | POA: Diagnosis not present

## 2015-07-01 DIAGNOSIS — Z8673 Personal history of transient ischemic attack (TIA), and cerebral infarction without residual deficits: Secondary | ICD-10-CM | POA: Diagnosis not present

## 2015-07-01 DIAGNOSIS — M9981 Other biomechanical lesions of cervical region: Secondary | ICD-10-CM | POA: Diagnosis not present

## 2015-07-01 DIAGNOSIS — M5030 Other cervical disc degeneration, unspecified cervical region: Secondary | ICD-10-CM | POA: Diagnosis not present

## 2015-07-01 DIAGNOSIS — K219 Gastro-esophageal reflux disease without esophagitis: Secondary | ICD-10-CM | POA: Diagnosis not present

## 2015-07-01 DIAGNOSIS — I10 Essential (primary) hypertension: Secondary | ICD-10-CM | POA: Diagnosis not present

## 2015-07-01 DIAGNOSIS — Z7982 Long term (current) use of aspirin: Secondary | ICD-10-CM | POA: Diagnosis not present

## 2015-07-01 DIAGNOSIS — F419 Anxiety disorder, unspecified: Secondary | ICD-10-CM | POA: Diagnosis not present

## 2015-07-01 DIAGNOSIS — M47812 Spondylosis without myelopathy or radiculopathy, cervical region: Secondary | ICD-10-CM | POA: Diagnosis not present

## 2015-07-01 DIAGNOSIS — Z79899 Other long term (current) drug therapy: Secondary | ICD-10-CM | POA: Diagnosis not present

## 2015-07-05 ENCOUNTER — Other Ambulatory Visit (HOSPITAL_COMMUNITY): Payer: Self-pay | Admitting: Internal Medicine

## 2015-07-05 DIAGNOSIS — Z780 Asymptomatic menopausal state: Secondary | ICD-10-CM

## 2015-07-05 DIAGNOSIS — I10 Essential (primary) hypertension: Secondary | ICD-10-CM | POA: Diagnosis not present

## 2015-07-05 DIAGNOSIS — E785 Hyperlipidemia, unspecified: Secondary | ICD-10-CM | POA: Diagnosis not present

## 2015-07-05 DIAGNOSIS — Z6826 Body mass index (BMI) 26.0-26.9, adult: Secondary | ICD-10-CM | POA: Diagnosis not present

## 2015-07-05 DIAGNOSIS — I493 Ventricular premature depolarization: Secondary | ICD-10-CM | POA: Diagnosis not present

## 2015-07-12 ENCOUNTER — Ambulatory Visit (HOSPITAL_COMMUNITY)
Admission: RE | Admit: 2015-07-12 | Discharge: 2015-07-12 | Disposition: A | Discharge status: Home / Self Care | Payer: Medicare Other | Source: Ambulatory Visit | Attending: Internal Medicine | Admitting: Internal Medicine

## 2015-07-12 DIAGNOSIS — M810 Age-related osteoporosis without current pathological fracture: Secondary | ICD-10-CM | POA: Insufficient documentation

## 2015-07-12 DIAGNOSIS — M8588 Other specified disorders of bone density and structure, other site: Secondary | ICD-10-CM | POA: Diagnosis not present

## 2015-07-12 DIAGNOSIS — Z780 Asymptomatic menopausal state: Secondary | ICD-10-CM | POA: Insufficient documentation

## 2015-08-04 DIAGNOSIS — M9981 Other biomechanical lesions of cervical region: Secondary | ICD-10-CM | POA: Diagnosis not present

## 2015-08-04 DIAGNOSIS — M47812 Spondylosis without myelopathy or radiculopathy, cervical region: Secondary | ICD-10-CM | POA: Diagnosis not present

## 2015-08-04 DIAGNOSIS — M5030 Other cervical disc degeneration, unspecified cervical region: Secondary | ICD-10-CM | POA: Diagnosis not present

## 2015-08-20 DIAGNOSIS — F329 Major depressive disorder, single episode, unspecified: Secondary | ICD-10-CM | POA: Diagnosis not present

## 2015-08-20 DIAGNOSIS — F419 Anxiety disorder, unspecified: Secondary | ICD-10-CM | POA: Diagnosis not present

## 2015-08-20 DIAGNOSIS — M5030 Other cervical disc degeneration, unspecified cervical region: Secondary | ICD-10-CM | POA: Diagnosis not present

## 2015-08-20 DIAGNOSIS — I10 Essential (primary) hypertension: Secondary | ICD-10-CM | POA: Diagnosis not present

## 2015-08-20 DIAGNOSIS — G8929 Other chronic pain: Secondary | ICD-10-CM | POA: Diagnosis not present

## 2015-08-20 DIAGNOSIS — E785 Hyperlipidemia, unspecified: Secondary | ICD-10-CM | POA: Diagnosis not present

## 2015-08-20 DIAGNOSIS — E039 Hypothyroidism, unspecified: Secondary | ICD-10-CM | POA: Diagnosis not present

## 2015-08-20 DIAGNOSIS — Z8673 Personal history of transient ischemic attack (TIA), and cerebral infarction without residual deficits: Secondary | ICD-10-CM | POA: Diagnosis not present

## 2015-08-20 DIAGNOSIS — Z7982 Long term (current) use of aspirin: Secondary | ICD-10-CM | POA: Diagnosis not present

## 2015-08-20 DIAGNOSIS — K219 Gastro-esophageal reflux disease without esophagitis: Secondary | ICD-10-CM | POA: Diagnosis not present

## 2015-08-20 DIAGNOSIS — Z79899 Other long term (current) drug therapy: Secondary | ICD-10-CM | POA: Diagnosis not present

## 2015-08-20 DIAGNOSIS — M47812 Spondylosis without myelopathy or radiculopathy, cervical region: Secondary | ICD-10-CM | POA: Diagnosis not present

## 2015-08-20 DIAGNOSIS — M9981 Other biomechanical lesions of cervical region: Secondary | ICD-10-CM | POA: Diagnosis not present

## 2015-09-06 DIAGNOSIS — Z23 Encounter for immunization: Secondary | ICD-10-CM | POA: Diagnosis not present

## 2015-09-28 DIAGNOSIS — M47812 Spondylosis without myelopathy or radiculopathy, cervical region: Secondary | ICD-10-CM | POA: Diagnosis not present

## 2015-10-13 DIAGNOSIS — H5202 Hypermetropia, left eye: Secondary | ICD-10-CM | POA: Diagnosis not present

## 2015-10-13 DIAGNOSIS — H35033 Hypertensive retinopathy, bilateral: Secondary | ICD-10-CM | POA: Diagnosis not present

## 2015-10-13 DIAGNOSIS — H524 Presbyopia: Secondary | ICD-10-CM | POA: Diagnosis not present

## 2015-10-13 DIAGNOSIS — H52223 Regular astigmatism, bilateral: Secondary | ICD-10-CM | POA: Diagnosis not present

## 2015-11-17 ENCOUNTER — Other Ambulatory Visit (INDEPENDENT_AMBULATORY_CARE_PROVIDER_SITE_OTHER): Payer: Self-pay | Admitting: Otolaryngology

## 2015-11-17 DIAGNOSIS — E785 Hyperlipidemia, unspecified: Secondary | ICD-10-CM | POA: Diagnosis not present

## 2015-11-17 DIAGNOSIS — E041 Nontoxic single thyroid nodule: Secondary | ICD-10-CM

## 2015-11-17 DIAGNOSIS — Z79899 Other long term (current) drug therapy: Secondary | ICD-10-CM | POA: Diagnosis not present

## 2015-11-25 DIAGNOSIS — E785 Hyperlipidemia, unspecified: Secondary | ICD-10-CM | POA: Diagnosis not present

## 2015-11-25 DIAGNOSIS — I10 Essential (primary) hypertension: Secondary | ICD-10-CM | POA: Diagnosis not present

## 2015-11-25 DIAGNOSIS — Z6826 Body mass index (BMI) 26.0-26.9, adult: Secondary | ICD-10-CM | POA: Diagnosis not present

## 2015-11-25 DIAGNOSIS — I6521 Occlusion and stenosis of right carotid artery: Secondary | ICD-10-CM | POA: Diagnosis not present

## 2015-11-26 ENCOUNTER — Other Ambulatory Visit (HOSPITAL_COMMUNITY): Payer: Medicare Other

## 2015-11-29 ENCOUNTER — Ambulatory Visit (HOSPITAL_COMMUNITY)
Admission: RE | Admit: 2015-11-29 | Discharge: 2015-11-29 | Disposition: A | Discharge status: Home / Self Care | Payer: Medicare Other | Source: Ambulatory Visit | Attending: Otolaryngology | Admitting: Otolaryngology

## 2015-11-29 DIAGNOSIS — E042 Nontoxic multinodular goiter: Secondary | ICD-10-CM | POA: Insufficient documentation

## 2015-11-29 DIAGNOSIS — E041 Nontoxic single thyroid nodule: Secondary | ICD-10-CM

## 2015-12-02 ENCOUNTER — Ambulatory Visit (INDEPENDENT_AMBULATORY_CARE_PROVIDER_SITE_OTHER): Payer: Medicare Other | Admitting: Otolaryngology

## 2015-12-02 DIAGNOSIS — D440 Neoplasm of uncertain behavior of thyroid gland: Secondary | ICD-10-CM | POA: Diagnosis not present

## 2015-12-21 ENCOUNTER — Other Ambulatory Visit (HOSPITAL_COMMUNITY): Payer: Self-pay | Admitting: Internal Medicine

## 2015-12-21 DIAGNOSIS — R0989 Other specified symptoms and signs involving the circulatory and respiratory systems: Secondary | ICD-10-CM

## 2015-12-28 ENCOUNTER — Ambulatory Visit (HOSPITAL_COMMUNITY)
Admission: RE | Admit: 2015-12-28 | Discharge: 2015-12-28 | Disposition: A | Discharge status: Home / Self Care | Payer: Medicare Other | Source: Ambulatory Visit | Attending: Internal Medicine | Admitting: Internal Medicine

## 2015-12-28 DIAGNOSIS — I6521 Occlusion and stenosis of right carotid artery: Secondary | ICD-10-CM | POA: Diagnosis not present

## 2015-12-28 DIAGNOSIS — R0989 Other specified symptoms and signs involving the circulatory and respiratory systems: Secondary | ICD-10-CM | POA: Diagnosis not present

## 2015-12-29 ENCOUNTER — Telehealth: Payer: Self-pay | Admitting: Vascular Surgery

## 2015-12-29 ENCOUNTER — Other Ambulatory Visit: Payer: Self-pay

## 2015-12-29 DIAGNOSIS — M5030 Other cervical disc degeneration, unspecified cervical region: Secondary | ICD-10-CM | POA: Diagnosis not present

## 2015-12-29 DIAGNOSIS — I6521 Occlusion and stenosis of right carotid artery: Secondary | ICD-10-CM

## 2015-12-29 DIAGNOSIS — R0989 Other specified symptoms and signs involving the circulatory and respiratory systems: Secondary | ICD-10-CM

## 2015-12-29 DIAGNOSIS — M47812 Spondylosis without myelopathy or radiculopathy, cervical region: Secondary | ICD-10-CM | POA: Diagnosis not present

## 2015-12-29 DIAGNOSIS — M9981 Other biomechanical lesions of cervical region: Secondary | ICD-10-CM | POA: Diagnosis not present

## 2015-12-29 NOTE — Telephone Encounter (Signed)
-----   Message from Denman George, RN sent at 12/29/2015  9:30 AM EST ----- Regarding: needs OV within one week with [facility] Rec'd call from Dr. Ozella Almond; he spoke with Dr. Scot Dock re: this pt. with high grade right carotid stenosis.  Dr. Scot Dock wants this pt. worked in within one week, and to do right carotid artery ultrasound.  Please contact Beth @ Dr. Ria Comment office [phone]), and also the pt.  Sheila Briggs is faxing a referral with pt's notes.

## 2015-12-29 NOTE — Telephone Encounter (Signed)
Spoke with Corporate treasurer at [place] office, dpm

## 2015-12-31 ENCOUNTER — Encounter: Payer: Self-pay | Admitting: Vascular Surgery

## 2016-01-05 ENCOUNTER — Ambulatory Visit (INDEPENDENT_AMBULATORY_CARE_PROVIDER_SITE_OTHER): Payer: Medicare Other | Admitting: Vascular Surgery

## 2016-01-05 ENCOUNTER — Other Ambulatory Visit: Payer: Self-pay

## 2016-01-05 ENCOUNTER — Ambulatory Visit (HOSPITAL_COMMUNITY)
Admission: RE | Admit: 2016-01-05 | Discharge: 2016-01-05 | Disposition: A | Discharge status: Home / Self Care | Payer: Medicare Other | Source: Ambulatory Visit | Attending: Vascular Surgery | Admitting: Vascular Surgery

## 2016-01-05 ENCOUNTER — Encounter: Payer: Self-pay | Admitting: Vascular Surgery

## 2016-01-05 VITALS — BP 142/53 | HR 75 | Temp 97.9°F | Resp 14 | Ht 61.0 in | Wt 123.0 lb

## 2016-01-05 DIAGNOSIS — I6521 Occlusion and stenosis of right carotid artery: Secondary | ICD-10-CM | POA: Insufficient documentation

## 2016-01-05 DIAGNOSIS — E785 Hyperlipidemia, unspecified: Secondary | ICD-10-CM | POA: Diagnosis not present

## 2016-01-05 DIAGNOSIS — R0989 Other specified symptoms and signs involving the circulatory and respiratory systems: Secondary | ICD-10-CM | POA: Diagnosis not present

## 2016-01-05 DIAGNOSIS — I10 Essential (primary) hypertension: Secondary | ICD-10-CM | POA: Diagnosis not present

## 2016-01-05 NOTE — Progress Notes (Signed)
Filed Vitals:   01/05/16 0912 01/05/16 0916 01/05/16 0922 01/05/16 0923  BP: 145/54 149/53 151/54 142/53  Pulse: 76 75 77 75  Temp:  97.9 F (36.6 C)    TempSrc:  Oral    Resp:  14    Height:  5\' 1"  (1.549 m)    Weight:  123 lb (55.792 kg)    SpO2:  98%

## 2016-01-05 NOTE — Progress Notes (Signed)
[facility] [place]  Patient name: Sheila Briggs MRN: [id number] DOB: 1932-01-08 Sex: female  REASON FOR CONSULT: Right carotid stenosis. Referred by Dr. Willey Blade.  HPI: Sheila Briggs is a 82 y.o. female, who was being followed with a moderate right carotid stenosis. On her most recent follow up duplex, the stenosis had progressed to greater than 80%. She had a very tight right carotid stenosis and was sent for vascular consultation. She is right-handed. She states that he ago in December 2015, she had a mild stroke associated with left-sided weakness. She has not been on aspirin recently because of some bruising problems I believe. She denies any recent history of stroke, TIAs, expressive or receptive aphasia, or amaurosis fugax.  Have reviewed the records that were sent with the patient. Patient underwent a bilateral carotid duplex scan on 12/28/2015. This showed a greater than 80% right carotid stenosis with no significant stenosis on the left. Both vertebral arteries were patent with antegrade flow.  The patient also has a history of essential hypertension which is been under good control. In addition she has gastroesophageal reflux disease, hyperlipidemia, irritable bowel syndrome and history of hypothyroidism.  Past Medical History  Diagnosis Date  . Thyroid disease   . Hypertension   . Hypercholesteremia   . GERD (gastroesophageal reflux disease)   . Hypothyroidism   . Anxiety   . Cancer (HCC)     basal cell of scalp  . Arthritis   . HOH (hard of hearing)     History reviewed. No pertinent family history.  SOCIAL HISTORY: Social History   Social History  . Marital Status: Widowed    Spouse Name: N/A  . Number of Children: N/A  . Years of Education: N/A   Occupational History  . Not on file.   Social History Main Topics  . Smoking status: Never Smoker   . Smokeless tobacco: Never Used  . Alcohol Use: No  . Drug Use: No  . Sexual Activity:  Yes    Birth Control/ Protection: Surgical   Other Topics Concern  . Not on file   Social History Narrative    No Known Allergies  Current Outpatient Prescriptions  Medication Sig Dispense Refill  . amLODipine (NORVASC) 2.5 MG tablet Take 2.5 mg by mouth daily.     Marland Kitchen atorvastatin (LIPITOR) 40 MG tablet Take 1 tablet (40 mg total) by mouth daily. 30 tablet 12  . citalopram (CELEXA) 10 MG tablet Take 10 mg by mouth daily.    . diclofenac (CATAFLAM) 50 MG tablet Take 1 tablet (50 mg total) by mouth 2 (two) times daily. 60 tablet 1  . gabapentin (NEURONTIN) 100 MG capsule Take 1 capsule (100 mg total) by mouth 3 (three) times daily. 60 capsule 1  . hydrochlorothiazide (HYDRODIURIL) 12.5 MG tablet Take 12.5 mg by mouth daily.     . Multiple Vitamin (MULTIVITAMIN WITH MINERALS) TABS Take 1 tablet by mouth daily.    Vladimir Faster Glycol-Propyl Glycol (SYSTANE ULTRA OP) Apply 1 drop to eye 4 (four) times daily.    . ramipril (ALTACE) 2.5 MG capsule Take 1 capsule (2.5 mg total) by mouth daily. 30 capsule 12  . SYNTHROID 25 MCG tablet Take 25 mcg by mouth daily.     Marland Kitchen aspirin EC 325 MG tablet Take 1 tablet (325 mg total) by mouth daily. (Patient not taking: Reported on 01/05/2016) 30 tablet 0  . naproxen sodium (ALEVE) 220 MG tablet Take 220 mg by mouth  2 (two) times daily as needed. Reported on 01/05/2016     No current facility-administered medications for this visit.    REVIEW OF SYSTEMS:  [X]  denotes positive finding, [ ]  denotes negative finding Cardiac  Comments:  Chest pain or chest pressure:    Shortness of breath upon exertion:    Short of breath when lying flat:    Irregular heart rhythm:        Vascular    Pain in calf, thigh, or hip brought on by ambulation:    Pain in feet at night that wakes you up from your sleep:     Blood clot in your veins:    Leg swelling:         Pulmonary    Oxygen at home:    Productive cough:     Wheezing:         Neurologic    Sudden  weakness in arms or legs:     Numbness in arms or legs:  X She does occasionally get some paresthesias in her left arm which she attributes to cervical disc disease.  Sudden onset of difficulty speaking or slurred speech:    Temporary loss of vision in one eye:     Problems with dizziness:         Gastrointestinal    Blood in stool:     Vomited blood:         Genitourinary    Burning when urinating:     Blood in urine:        Psychiatric    Major depression:         Hematologic    Bleeding problems:    Problems with blood clotting too easily:        Skin    Rashes or ulcers:        Constitutional    Fever or chills:      PHYSICAL EXAM: Filed Vitals:   01/05/16 0912 01/05/16 0916 01/05/16 0922 01/05/16 0923  BP: 145/54 149/53 151/54 142/53  Pulse: 76 75 77 75  Temp:  97.9 F (36.6 C)    TempSrc:  Oral    Resp:  14    Height:  5\' 1"  (1.549 m)    Weight:  123 lb (55.792 kg)    SpO2:  98%      GENERAL: The patient is a well-nourished female, in no acute distress. The vital signs are documented above. CARDIAC: There is a regular rate and rhythm.  VASCULAR: she has a right carotid bruit. She has palpable dorsalis pedis pulses. PULMONARY: There is good air exchange bilaterally without wheezing or rales. ABDOMEN: Soft and non-tender with normal pitched bowel sounds.  MUSCULOSKELETAL: There are no major deformities or cyanosis. NEUROLOGIC: No focal weakness or paresthesias are detected. SKIN: There are no ulcers or rashes noted. PSYCHIATRIC: The patient has a normal affect.  DATA:   RIGHT CAROTID DUPLEX: I have independently interpreted the limited right carotid duplex scan our office today which shows a high-grade right carotid stenosis. End diastolic velocity is [id number] cm/s. The patient clearly has a greater than 80% right carotid stenosis.  MEDICAL ISSUES:  GREATER THAN 80% RIGHT CAROTID STENOSIS: She did have a right hemispheric stroke a year ago. At that time she  had only a mild stenosis. However the stenosis now progressed to greater than 80% and I would recommend right carotid endarterectomy. I have reviewed the indications for carotid endarterectomy, that is to lower the risk of future stroke.  I have also reviewed the potential complications of surgery, including but not limited to: bleeding, stroke (perioperative risk 1-2%), MI, nerve injury of other unpredictable medical problems. All of the patients questions were answered and they are agreeable to proceed with surgery. I have instructed her to begin taking 81 mg of aspirin daily. Her surgery is scheduled for 01/13/16.    [name] [facility] [place]: [phone]

## 2016-01-10 ENCOUNTER — Encounter (HOSPITAL_COMMUNITY): Payer: Self-pay

## 2016-01-10 ENCOUNTER — Encounter (HOSPITAL_COMMUNITY)
Admission: RE | Admit: 2016-01-10 | Discharge: 2016-01-10 | Disposition: A | Discharge status: Home / Self Care | Payer: Medicare Other | Source: Ambulatory Visit | Attending: Vascular Surgery | Admitting: Vascular Surgery

## 2016-01-10 DIAGNOSIS — I6521 Occlusion and stenosis of right carotid artery: Secondary | ICD-10-CM | POA: Diagnosis not present

## 2016-01-10 DIAGNOSIS — E039 Hypothyroidism, unspecified: Secondary | ICD-10-CM | POA: Diagnosis not present

## 2016-01-10 DIAGNOSIS — K219 Gastro-esophageal reflux disease without esophagitis: Secondary | ICD-10-CM | POA: Diagnosis not present

## 2016-01-10 DIAGNOSIS — I10 Essential (primary) hypertension: Secondary | ICD-10-CM | POA: Diagnosis not present

## 2016-01-10 HISTORY — DX: Cerebral infarction, unspecified: I63.9

## 2016-01-10 LAB — COMPREHENSIVE METABOLIC PANEL
ALBUMIN: 4.1 g/dL (ref 3.5–5.0)
ALT: 22 U/L (ref 14–54)
ANION GAP: 13 (ref 5–15)
AST: 27 U/L (ref 15–41)
Alkaline Phosphatase: 77 U/L (ref 38–126)
BUN: 15 mg/dL (ref 6–20)
CO2: 28 mmol/L (ref 22–32)
Calcium: 9.8 mg/dL (ref 8.9–10.3)
Chloride: 98 mmol/L — ABNORMAL LOW (ref 101–111)
Creatinine, Ser: 0.69 mg/dL (ref 0.44–1.00)
GFR calc Af Amer: >60 mL/min (ref >60)
GFR calc non Af Amer: >60 mL/min (ref >60)
GLUCOSE: 108 mg/dL — AB (ref 65–99)
POTASSIUM: 3.7 mmol/L (ref 3.5–5.1)
SODIUM: 139 mmol/L (ref 135–145)
Total Bilirubin: 0.3 mg/dL (ref 0.3–1.2)
Total Protein: 7.1 g/dL (ref 6.5–8.1)

## 2016-01-10 LAB — URINE MICROSCOPIC-ADD ON

## 2016-01-10 LAB — CBC
HCT: 43.9 % (ref 36.0–46.0)
HEMOGLOBIN: 14.9 g/dL (ref 12.0–15.0)
MCH: 30.5 pg (ref 26.0–34.0)
MCHC: 33.9 g/dL (ref 30.0–36.0)
MCV: 89.8 fL (ref 78.0–100.0)
Platelets: 283 10*3/uL (ref 150–400)
RBC: 4.89 MIL/uL (ref 3.87–5.11)
RDW: 13.7 % (ref 11.5–15.5)
WBC: 10.4 10*3/uL (ref 4.0–10.5)

## 2016-01-10 LAB — URINALYSIS, ROUTINE W REFLEX MICROSCOPIC
BILIRUBIN URINE: NEGATIVE
GLUCOSE, UA: NEGATIVE mg/dL
Hgb urine dipstick: NEGATIVE
KETONES UR: NEGATIVE mg/dL
NITRITE: NEGATIVE
PH: 7.5 (ref 5.0–8.0)
Protein, ur: NEGATIVE mg/dL
Specific Gravity, Urine: 1.011 (ref 1.005–1.030)

## 2016-01-10 LAB — PROTIME-INR
INR: 1.01 (ref 0.00–1.49)
Prothrombin Time: 13.5 seconds (ref 11.6–15.2)

## 2016-01-10 LAB — ABO/RH: ABO/RH(D): A NEG

## 2016-01-10 LAB — TYPE AND SCREEN
ABO/RH(D): A NEG
ANTIBODY SCREEN: NEGATIVE

## 2016-01-10 LAB — SURGICAL PCR SCREEN
MRSA, PCR: NEGATIVE
STAPHYLOCOCCUS AUREUS: NEGATIVE

## 2016-01-10 LAB — APTT: APTT: 35 s (ref 24–37)

## 2016-01-10 NOTE — Pre-Procedure Instructions (Signed)
Laylin Caspersen Brusca  01/10/2016      [facility] [address] [place] [address] Phone: [phone] Fax: [phone]    Your procedure is scheduled on March 2  Report to [facility] at 615 A.M.  Call this number if you have problems the morning of surgery:  [phone]   Remember:  Do not eat food or drink liquids after midnight.  Take these medicines the morning of surgery with A SIP OF WATER amlodipine (norvasc), citalopram (Celexa), gabapentin (Neurontin), levothyroxine (synthroid)   Stop taking aspirin, aleve, Ibuprofen, Advil, Motrin, BC's, Goody's, Herbal medications, Fish oil   Do not wear jewelry, make-up or nail polish.  Do not wear lotions, powders, or perfumes.  You may wear deodorant.  Do not shave 48 hours prior to surgery.  Men may shave face and neck.  Do not bring valuables to the hospital.  [facility] is not responsible for any belongings or valuables.  Contacts, dentures or bridgework may not be worn into surgery.  Leave your suitcase in the car.  After surgery it may be brought to your room.  For patients admitted to the hospital, discharge time will be determined by your treatment team.  Patients discharged the day of surgery will not be allowed to drive home.    Special instructions:  [facility] - Preparing for Surgery  Before surgery, you can play an important role.  Because skin is not sterile, your skin needs to be as free of germs as possible.  You can reduce the number of germs on you skin by washing with CHG (chlorahexidine gluconate) soap before surgery.  CHG is an antiseptic cleaner which kills germs and bonds with the skin to continue killing germs even after washing.  Please DO NOT use if you have an allergy to CHG or antibacterial soaps.  If your skin becomes reddened/irritated stop using the CHG and inform your nurse when you arrive at Short Stay.  Do not shave (including  legs and underarms) for at least 48 hours prior to the first CHG shower.  You may shave your face.  Please follow these instructions carefully:   1.  Shower with CHG Soap the night before surgery and the  morning of Surgery.  2.  If you choose to wash your hair, wash your hair first as usual with your  normal shampoo.  3.  After you shampoo, rinse your hair and body thoroughly to remove the  Shampoo.  4.  Use CHG as you would any other liquid soap.  You can apply chg directly  to the skin and wash gently with scrungie or a clean washcloth.  5.  Apply the CHG Soap to your body ONLY FROM THE NECK DOWN.   Do not use on open wounds or open sores.  Avoid contact with your eyes,  ears, mouth and genitals (private parts).  Wash genitals (private parts) with your normal soap.  6.  Wash thoroughly, paying special attention to the area where your surgery  will be performed.  7.  Thoroughly rinse your body with warm water from the neck down.  8.  DO NOT shower/wash with your normal soap after using and rinsing off   the CHG Soap.  9.  Pat yourself dry with a clean towel.            10.  Wear clean pajamas.  11.  Place clean sheets on your bed the night of your first shower and do not sleep with pets.  Day of Surgery  Do not apply any lotions/deoderants the morning of surgery.  Please wear clean clothes to the hospital/surgery center.     Please read over the following fact sheets that you were given. Pain Booklet, Coughing and Deep Breathing, Blood Transfusion Information, MRSA Information and Surgical Site Infection Prevention

## 2016-01-10 NOTE — Progress Notes (Signed)
PCP is Dr Asencion Noble Denies ever seeing a cardiologist. Echo noted from 10-14-14 Denies ever having a card cath, or stress test No c/o chest pain or discomfort.

## 2016-01-12 MED ORDER — SODIUM CHLORIDE 0.9 % IV SOLN: INTRAVENOUS | Status: DC

## 2016-01-12 MED ORDER — DEXTROSE 5 % IV SOLN
1.5000 g | INTRAVENOUS | Status: AC
  Administered 2016-01-13: 1.5 g via INTRAVENOUS
  Filled 2016-01-12: qty 1.5

## 2016-01-13 ENCOUNTER — Inpatient Hospital Stay (HOSPITAL_COMMUNITY)
Admission: RE | Admit: 2016-01-13 | Discharge: 2016-01-15 | DRG: 039 | Disposition: A | Discharge status: Home Health | Payer: Medicare Other | Source: Ambulatory Visit | Attending: Vascular Surgery | Admitting: Vascular Surgery

## 2016-01-13 ENCOUNTER — Inpatient Hospital Stay (HOSPITAL_COMMUNITY): Payer: Medicare Other | Admitting: Certified Registered Nurse Anesthetist

## 2016-01-13 ENCOUNTER — Encounter (HOSPITAL_COMMUNITY)
Admission: RE | Disposition: A | Discharge status: Home Health | Payer: Self-pay | Source: Ambulatory Visit | Attending: Vascular Surgery

## 2016-01-13 ENCOUNTER — Encounter (HOSPITAL_COMMUNITY): Payer: Self-pay | Admitting: Certified Registered Nurse Anesthetist

## 2016-01-13 DIAGNOSIS — E039 Hypothyroidism, unspecified: Secondary | ICD-10-CM | POA: Diagnosis present

## 2016-01-13 DIAGNOSIS — K219 Gastro-esophageal reflux disease without esophagitis: Secondary | ICD-10-CM | POA: Diagnosis present

## 2016-01-13 DIAGNOSIS — I10 Essential (primary) hypertension: Secondary | ICD-10-CM | POA: Diagnosis present

## 2016-01-13 DIAGNOSIS — Z01810 Encounter for preprocedural cardiovascular examination: Secondary | ICD-10-CM | POA: Diagnosis not present

## 2016-01-13 DIAGNOSIS — I6521 Occlusion and stenosis of right carotid artery: Secondary | ICD-10-CM | POA: Diagnosis not present

## 2016-01-13 DIAGNOSIS — I6529 Occlusion and stenosis of unspecified carotid artery: Secondary | ICD-10-CM | POA: Diagnosis present

## 2016-01-13 DIAGNOSIS — Z01812 Encounter for preprocedural laboratory examination: Secondary | ICD-10-CM | POA: Diagnosis not present

## 2016-01-13 DIAGNOSIS — E7800 Pure hypercholesterolemia, unspecified: Secondary | ICD-10-CM | POA: Diagnosis present

## 2016-01-13 DIAGNOSIS — Z791 Long term (current) use of non-steroidal anti-inflammatories (NSAID): Secondary | ICD-10-CM | POA: Diagnosis not present

## 2016-01-13 DIAGNOSIS — Z0183 Encounter for blood typing: Secondary | ICD-10-CM | POA: Diagnosis not present

## 2016-01-13 DIAGNOSIS — Z79899 Other long term (current) drug therapy: Secondary | ICD-10-CM | POA: Diagnosis not present

## 2016-01-13 DIAGNOSIS — F419 Anxiety disorder, unspecified: Secondary | ICD-10-CM | POA: Diagnosis present

## 2016-01-13 DIAGNOSIS — H9190 Unspecified hearing loss, unspecified ear: Secondary | ICD-10-CM | POA: Diagnosis present

## 2016-01-13 DIAGNOSIS — Z7982 Long term (current) use of aspirin: Secondary | ICD-10-CM | POA: Diagnosis not present

## 2016-01-13 DIAGNOSIS — M1990 Unspecified osteoarthritis, unspecified site: Secondary | ICD-10-CM | POA: Diagnosis present

## 2016-01-13 HISTORY — PX: ENDARTERECTOMY: SHX5162

## 2016-01-13 HISTORY — PX: PATCH ANGIOPLASTY: SHX6230

## 2016-01-13 LAB — CBC
HEMATOCRIT: 40.7 % (ref 36.0–46.0)
HEMOGLOBIN: 13.6 g/dL (ref 12.0–15.0)
MCH: 29.8 pg (ref 26.0–34.0)
MCHC: 33.4 g/dL (ref 30.0–36.0)
MCV: 89.1 fL (ref 78.0–100.0)
Platelets: 257 10*3/uL (ref 150–400)
RBC: 4.57 MIL/uL (ref 3.87–5.11)
RDW: 13.7 % (ref 11.5–15.5)
WBC: 9.7 10*3/uL (ref 4.0–10.5)

## 2016-01-13 LAB — CREATININE, SERUM
Creatinine, Ser: 0.67 mg/dL (ref 0.44–1.00)
GFR calc Af Amer: >60 mL/min (ref >60)

## 2016-01-13 SURGERY — ENDARTERECTOMY, CAROTID
Anesthesia: General | Site: Neck | Laterality: Right

## 2016-01-13 MED ORDER — DEXTRAN 40 IN SALINE 10-0.9 % IV SOLN
INTRAVENOUS | Status: DC | PRN
  Administered 2016-01-13: 500 mL

## 2016-01-13 MED ORDER — LIDOCAINE HCL (PF) 1 % IJ SOLN
INTRAMUSCULAR | Status: AC
  Filled 2016-01-13: qty 30

## 2016-01-13 MED ORDER — LACTATED RINGERS IV SOLN
INTRAVENOUS | Status: DC | PRN
  Administered 2016-01-13 (×2): via INTRAVENOUS

## 2016-01-13 MED ORDER — HYDROCHLOROTHIAZIDE 25 MG PO TABS
12.5000 mg | ORAL_TABLET | Freq: Every day | ORAL | Status: DC
  Administered 2016-01-14 – 2016-01-15 (×2): 12.5 mg via ORAL
  Filled 2016-01-13 (×2): qty 1

## 2016-01-13 MED ORDER — LIDOCAINE-EPINEPHRINE (PF) 1 %-1:200000 IJ SOLN
INTRAMUSCULAR | Status: DC | PRN
  Administered 2016-01-13: 30 mL

## 2016-01-13 MED ORDER — GLYCOPYRROLATE 0.2 MG/ML IJ SOLN
INTRAMUSCULAR | Status: AC
  Filled 2016-01-13: qty 2

## 2016-01-13 MED ORDER — STERILE WATER FOR INJECTION IJ SOLN
INTRAMUSCULAR | Status: AC
  Filled 2016-01-13: qty 10

## 2016-01-13 MED ORDER — CHLORHEXIDINE GLUCONATE CLOTH 2 % EX PADS: 6.0000 | MEDICATED_PAD | Freq: Once | CUTANEOUS | Status: DC

## 2016-01-13 MED ORDER — NEOSTIGMINE METHYLSULFATE 10 MG/10ML IV SOLN
INTRAVENOUS | Status: DC | PRN
  Administered 2016-01-13: 3 mg via INTRAVENOUS

## 2016-01-13 MED ORDER — FENTANYL CITRATE (PF) 250 MCG/5ML IJ SOLN
INTRAMUSCULAR | Status: AC
  Filled 2016-01-13: qty 5

## 2016-01-13 MED ORDER — LABETALOL HCL 5 MG/ML IV SOLN
10.0000 mg | INTRAVENOUS | Status: DC | PRN
  Administered 2016-01-15: 10 mg via INTRAVENOUS
  Filled 2016-01-13: qty 4

## 2016-01-13 MED ORDER — HEPARIN SODIUM (PORCINE) 1000 UNIT/ML IJ SOLN
INTRAMUSCULAR | Status: DC | PRN
  Administered 2016-01-13: 5500 [IU] via INTRAVENOUS

## 2016-01-13 MED ORDER — BISACODYL 5 MG PO TBEC: 5.0000 mg | DELAYED_RELEASE_TABLET | Freq: Every day | ORAL | Status: DC | PRN

## 2016-01-13 MED ORDER — SODIUM CHLORIDE 0.9 % IV SOLN
INTRAVENOUS | Status: DC | PRN
  Administered 2016-01-13: 09:00:00

## 2016-01-13 MED ORDER — FENTANYL CITRATE (PF) 100 MCG/2ML IJ SOLN
INTRAMUSCULAR | Status: DC | PRN
  Administered 2016-01-13: 100 ug via INTRAVENOUS

## 2016-01-13 MED ORDER — METOPROLOL TARTRATE 1 MG/ML IV SOLN: 2.0000 mg | INTRAVENOUS | Status: DC | PRN

## 2016-01-13 MED ORDER — PROPOFOL 10 MG/ML IV BOLUS
INTRAVENOUS | Status: DC | PRN
  Administered 2016-01-13: 100 mg via INTRAVENOUS
  Administered 2016-01-13: 50 mg via INTRAVENOUS

## 2016-01-13 MED ORDER — LIDOCAINE HCL (CARDIAC) 20 MG/ML IV SOLN
INTRAVENOUS | Status: DC | PRN
  Administered 2016-01-13: 60 mg via INTRAVENOUS

## 2016-01-13 MED ORDER — TRAMADOL HCL 50 MG PO TABS: 50.0000 mg | ORAL_TABLET | Freq: Three times a day (TID) | ORAL | Status: DC | PRN

## 2016-01-13 MED ORDER — DOCUSATE SODIUM 100 MG PO CAPS
100.0000 mg | ORAL_CAPSULE | Freq: Every day | ORAL | Status: DC
  Administered 2016-01-14 – 2016-01-15 (×2): 100 mg via ORAL
  Filled 2016-01-13 (×2): qty 1

## 2016-01-13 MED ORDER — LEVOTHYROXINE SODIUM 25 MCG PO TABS
25.0000 ug | ORAL_TABLET | Freq: Every day | ORAL | Status: DC
  Administered 2016-01-14 – 2016-01-15 (×2): 25 ug via ORAL
  Filled 2016-01-13 (×2): qty 1

## 2016-01-13 MED ORDER — ACETAMINOPHEN 10 MG/ML IV SOLN
1000.0000 mg | Freq: Once | INTRAVENOUS | Status: AC
  Administered 2016-01-13: 1000 mg via INTRAVENOUS

## 2016-01-13 MED ORDER — RAMIPRIL 2.5 MG PO CAPS
2.5000 mg | ORAL_CAPSULE | Freq: Every day | ORAL | Status: DC
  Administered 2016-01-14 – 2016-01-15 (×2): 2.5 mg via ORAL
  Filled 2016-01-13 (×4): qty 1

## 2016-01-13 MED ORDER — OXYCODONE-ACETAMINOPHEN 5-325 MG PO TABS
ORAL_TABLET | ORAL | Status: AC
  Filled 2016-01-13: qty 1

## 2016-01-13 MED ORDER — 0.9 % SODIUM CHLORIDE (POUR BTL) OPTIME
TOPICAL | Status: DC | PRN
  Administered 2016-01-13: 2000 mL

## 2016-01-13 MED ORDER — SODIUM CHLORIDE 0.9 % IV SOLN
0.0125 ug/kg/min | INTRAVENOUS | Status: DC
  Administered 2016-01-13: .1 ug/kg/min via INTRAVENOUS
  Filled 2016-01-13: qty 2000

## 2016-01-13 MED ORDER — ACETAMINOPHEN 650 MG RE SUPP
325.0000 mg | RECTAL | Status: DC | PRN
Start: 2016-01-13 — End: 2016-01-15

## 2016-01-13 MED ORDER — SUCCINYLCHOLINE CHLORIDE 20 MG/ML IJ SOLN
INTRAMUSCULAR | Status: AC
  Filled 2016-01-13: qty 1

## 2016-01-13 MED ORDER — CITALOPRAM HYDROBROMIDE 20 MG PO TABS
10.0000 mg | ORAL_TABLET | Freq: Every day | ORAL | Status: DC
  Administered 2016-01-14 – 2016-01-15 (×2): 10 mg via ORAL
  Filled 2016-01-13 (×2): qty 1

## 2016-01-13 MED ORDER — PHENYLEPHRINE HCL 10 MG/ML IJ SOLN
10.0000 mg | INTRAVENOUS | Status: DC | PRN
  Administered 2016-01-13: 20 ug/min via INTRAVENOUS

## 2016-01-13 MED ORDER — HYDRALAZINE HCL 20 MG/ML IJ SOLN
5.0000 mg | INTRAMUSCULAR | Status: AC | PRN
  Administered 2016-01-14 – 2016-01-15 (×2): 5 mg via INTRAVENOUS
  Filled 2016-01-13 (×2): qty 1

## 2016-01-13 MED ORDER — GLYCOPYRROLATE 0.2 MG/ML IJ SOLN
INTRAMUSCULAR | Status: DC | PRN
  Administered 2016-01-13: 0.2 mg via INTRAVENOUS
  Administered 2016-01-13: 0.4 mg via INTRAVENOUS

## 2016-01-13 MED ORDER — PANTOPRAZOLE SODIUM 40 MG PO TBEC
40.0000 mg | DELAYED_RELEASE_TABLET | Freq: Every day | ORAL | Status: DC
Start: 2016-01-13 — End: 2016-01-15
  Administered 2016-01-13 – 2016-01-15 (×3): 40 mg via ORAL
  Filled 2016-01-13 (×3): qty 1

## 2016-01-13 MED ORDER — POTASSIUM CHLORIDE CRYS ER 20 MEQ PO TBCR
20.0000 meq | EXTENDED_RELEASE_TABLET | Freq: Every day | ORAL | Status: DC | PRN
Start: 2016-01-13 — End: 2016-01-15

## 2016-01-13 MED ORDER — OXYCODONE-ACETAMINOPHEN 5-325 MG PO TABS
1.0000 | ORAL_TABLET | ORAL | Status: DC | PRN
  Administered 2016-01-13: 1 via ORAL

## 2016-01-13 MED ORDER — DEXTROSE 5 % IV SOLN
1.5000 g | Freq: Two times a day (BID) | INTRAVENOUS | Status: AC
  Administered 2016-01-13 – 2016-01-14 (×2): 1.5 g via INTRAVENOUS
  Filled 2016-01-13 (×3): qty 1.5

## 2016-01-13 MED ORDER — PHENOL 1.4 % MT LIQD: 1.0000 | OROMUCOSAL | Status: DC | PRN

## 2016-01-13 MED ORDER — MAGNESIUM SULFATE 2 GM/50ML IV SOLN
2.0000 g | Freq: Every day | INTRAVENOUS | Status: DC | PRN
  Filled 2016-01-13: qty 50

## 2016-01-13 MED ORDER — HEPARIN SODIUM (PORCINE) 1000 UNIT/ML IJ SOLN
INTRAMUSCULAR | Status: AC
  Filled 2016-01-13: qty 1

## 2016-01-13 MED ORDER — ENOXAPARIN SODIUM 40 MG/0.4ML ~~LOC~~ SOLN
40.0000 mg | SUBCUTANEOUS | Status: DC
  Administered 2016-01-14 – 2016-01-15 (×2): 40 mg via SUBCUTANEOUS
  Filled 2016-01-13 (×2): qty 0.4

## 2016-01-13 MED ORDER — MORPHINE SULFATE (PF) 2 MG/ML IV SOLN: 2.0000 mg | INTRAVENOUS | Status: DC | PRN

## 2016-01-13 MED ORDER — FENTANYL CITRATE (PF) 100 MCG/2ML IJ SOLN
25.0000 ug | INTRAMUSCULAR | Status: DC | PRN
  Administered 2016-01-13 (×2): 25 ug via INTRAVENOUS
  Administered 2016-01-13: 50 ug via INTRAVENOUS

## 2016-01-13 MED ORDER — ROCURONIUM BROMIDE 100 MG/10ML IV SOLN
INTRAVENOUS | Status: DC | PRN
  Administered 2016-01-13: 35 mg via INTRAVENOUS

## 2016-01-13 MED ORDER — ONDANSETRON HCL 4 MG/2ML IJ SOLN
INTRAMUSCULAR | Status: DC | PRN
  Administered 2016-01-13: 4 mg via INTRAVENOUS

## 2016-01-13 MED ORDER — ATORVASTATIN CALCIUM 40 MG PO TABS
40.0000 mg | ORAL_TABLET | Freq: Every day | ORAL | Status: DC
  Administered 2016-01-13 – 2016-01-15 (×3): 40 mg via ORAL
  Filled 2016-01-13 (×3): qty 1

## 2016-01-13 MED ORDER — AMLODIPINE BESYLATE 5 MG PO TABS
2.5000 mg | ORAL_TABLET | Freq: Every day | ORAL | Status: DC
  Administered 2016-01-14 – 2016-01-15 (×2): 2.5 mg via ORAL
  Filled 2016-01-13 (×2): qty 1

## 2016-01-13 MED ORDER — SENNOSIDES-DOCUSATE SODIUM 8.6-50 MG PO TABS: 1.0000 | ORAL_TABLET | Freq: Every evening | ORAL | Status: DC | PRN

## 2016-01-13 MED ORDER — ADULT MULTIVITAMIN W/MINERALS CH
1.0000 | ORAL_TABLET | Freq: Every day | ORAL | Status: DC
  Administered 2016-01-14 – 2016-01-15 (×2): 1 via ORAL
  Filled 2016-01-13 (×2): qty 1

## 2016-01-13 MED ORDER — ONDANSETRON HCL 4 MG/2ML IJ SOLN: 4.0000 mg | Freq: Four times a day (QID) | INTRAMUSCULAR | Status: DC | PRN

## 2016-01-13 MED ORDER — SODIUM CHLORIDE 0.9 % IV SOLN
INTRAVENOUS | Status: DC
  Administered 2016-01-13: 75 mL/h via INTRAVENOUS

## 2016-01-13 MED ORDER — PROTAMINE SULFATE 10 MG/ML IV SOLN
INTRAVENOUS | Status: DC | PRN
  Administered 2016-01-13: 30 mg via INTRAVENOUS

## 2016-01-13 MED ORDER — SODIUM CHLORIDE 0.9 % IV SOLN: 500.0000 mL | Freq: Once | INTRAVENOUS | Status: DC | PRN

## 2016-01-13 MED ORDER — LIDOCAINE HCL (CARDIAC) 20 MG/ML IV SOLN
INTRAVENOUS | Status: AC
  Filled 2016-01-13: qty 5

## 2016-01-13 MED ORDER — ASPIRIN EC 81 MG PO TBEC
81.0000 mg | DELAYED_RELEASE_TABLET | Freq: Every day | ORAL | Status: DC
  Administered 2016-01-13 – 2016-01-15 (×3): 81 mg via ORAL
  Filled 2016-01-13 (×3): qty 1

## 2016-01-13 MED ORDER — SUCCINYLCHOLINE CHLORIDE 20 MG/ML IJ SOLN
INTRAMUSCULAR | Status: DC | PRN
  Administered 2016-01-13: 80 mg via INTRAVENOUS

## 2016-01-13 MED ORDER — ROCURONIUM BROMIDE 50 MG/5ML IV SOLN
INTRAVENOUS | Status: AC
  Filled 2016-01-13: qty 1

## 2016-01-13 MED ORDER — DEXAMETHASONE 4 MG PO TABS
4.0000 mg | ORAL_TABLET | Freq: Two times a day (BID) | ORAL | Status: AC
  Administered 2016-01-13 (×2): 4 mg via ORAL
  Filled 2016-01-13 (×2): qty 1

## 2016-01-13 MED ORDER — EPHEDRINE SULFATE 50 MG/ML IJ SOLN
INTRAMUSCULAR | Status: AC
  Filled 2016-01-13: qty 1

## 2016-01-13 MED ORDER — FENTANYL CITRATE (PF) 100 MCG/2ML IJ SOLN
INTRAMUSCULAR | Status: AC
  Administered 2016-01-13: 50 ug via INTRAVENOUS
  Filled 2016-01-13: qty 2

## 2016-01-13 MED ORDER — LIDOCAINE-EPINEPHRINE (PF) 1 %-1:200000 IJ SOLN
INTRAMUSCULAR | Status: AC
  Filled 2016-01-13: qty 30

## 2016-01-13 MED ORDER — PROPOFOL 10 MG/ML IV BOLUS
INTRAVENOUS | Status: AC
  Filled 2016-01-13: qty 20

## 2016-01-13 MED ORDER — PROTAMINE SULFATE 10 MG/ML IV SOLN
INTRAVENOUS | Status: AC
  Filled 2016-01-13: qty 5

## 2016-01-13 MED ORDER — ALUM & MAG HYDROXIDE-SIMETH 200-200-20 MG/5ML PO SUSP: 15.0000 mL | ORAL | Status: DC | PRN

## 2016-01-13 MED ORDER — GUAIFENESIN-DM 100-10 MG/5ML PO SYRP: 15.0000 mL | ORAL_SOLUTION | ORAL | Status: DC | PRN

## 2016-01-13 MED ORDER — ACETAMINOPHEN 325 MG PO TABS: 325.0000 mg | ORAL_TABLET | ORAL | Status: DC | PRN

## 2016-01-13 MED ORDER — DEXTRAN 40 IN SALINE 10-0.9 % IV SOLN
INTRAVENOUS | Status: AC
  Filled 2016-01-13: qty 500

## 2016-01-13 MED ORDER — ONDANSETRON HCL 4 MG/2ML IJ SOLN
INTRAMUSCULAR | Status: AC
  Filled 2016-01-13: qty 2

## 2016-01-13 MED ORDER — ACETAMINOPHEN 10 MG/ML IV SOLN
INTRAVENOUS | Status: AC
  Filled 2016-01-13: qty 100

## 2016-01-13 MED ORDER — GABAPENTIN 100 MG PO CAPS
100.0000 mg | ORAL_CAPSULE | Freq: Three times a day (TID) | ORAL | Status: DC
  Administered 2016-01-13 – 2016-01-14 (×4): 100 mg via ORAL
  Filled 2016-01-13 (×4): qty 1

## 2016-01-13 SURGICAL SUPPLY — 47 items
BAG DECANTER FOR FLEXI CONT (MISCELLANEOUS) ×4 IMPLANT
CANISTER SUCTION 2500CC (MISCELLANEOUS) ×4 IMPLANT
CANNULA VESSEL 3MM 2 BLNT TIP (CANNULA) ×12 IMPLANT
CATH ROBINSON RED A/P 18FR (CATHETERS) ×4 IMPLANT
CLIP TI MEDIUM 24 (CLIP) ×4 IMPLANT
CLIP TI WIDE RED SMALL 24 (CLIP) ×4 IMPLANT
CRADLE DONUT ADULT HEAD (MISCELLANEOUS) ×4 IMPLANT
DRAIN CHANNEL 15F RND FF W/TCR (WOUND CARE) IMPLANT
ELECT REM PT RETURN 9FT ADLT (ELECTROSURGICAL) ×4
ELECTRODE REM PT RTRN 9FT ADLT (ELECTROSURGICAL) ×2 IMPLANT
EVACUATOR SILICONE 100CC (DRAIN) IMPLANT
GLOVE BIO SURGEON STRL SZ 6.5 (GLOVE) ×3 IMPLANT
GLOVE BIO SURGEON STRL SZ7.5 (GLOVE) ×4 IMPLANT
GLOVE BIO SURGEONS STRL SZ 6.5 (GLOVE) ×1
GLOVE BIOGEL PI IND STRL 6.5 (GLOVE) ×6 IMPLANT
GLOVE BIOGEL PI IND STRL 7.0 (GLOVE) ×4 IMPLANT
GLOVE BIOGEL PI IND STRL 7.5 (GLOVE) ×2 IMPLANT
GLOVE BIOGEL PI IND STRL 8 (GLOVE) ×2 IMPLANT
GLOVE BIOGEL PI INDICATOR 6.5 (GLOVE) ×6
GLOVE BIOGEL PI INDICATOR 7.0 (GLOVE) ×4
GLOVE BIOGEL PI INDICATOR 7.5 (GLOVE) ×2
GLOVE BIOGEL PI INDICATOR 8 (GLOVE) ×2
GLOVE ECLIPSE 7.0 STRL STRAW (GLOVE) ×4 IMPLANT
GLOVE SURG SS PI 6.5 STRL IVOR (GLOVE) ×8 IMPLANT
GOWN STRL REUS W/ TWL LRG LVL3 (GOWN DISPOSABLE) ×12 IMPLANT
GOWN STRL REUS W/TWL LRG LVL3 (GOWN DISPOSABLE) ×12
KIT BASIN OR (CUSTOM PROCEDURE TRAY) ×4 IMPLANT
KIT ROOM TURNOVER OR (KITS) ×4 IMPLANT
LIQUID BAND (GAUZE/BANDAGES/DRESSINGS) ×4 IMPLANT
NEEDLE HYPO 25X1 1.5 SAFETY (NEEDLE) ×4 IMPLANT
NS IRRIG 1000ML POUR BTL (IV SOLUTION) ×8 IMPLANT
PACK CAROTID (CUSTOM PROCEDURE TRAY) ×4 IMPLANT
PAD ARMBOARD 7.5X6 YLW CONV (MISCELLANEOUS) ×8 IMPLANT
PATCH VASC XENOSURE 1CMX6CM (Vascular Products) ×2 IMPLANT
PATCH VASC XENOSURE 1X6 (Vascular Products) ×2 IMPLANT
SHUNT CAROTID BYPASS 10 (VASCULAR PRODUCTS) ×4 IMPLANT
SPONGE INTESTINAL PEANUT (DISPOSABLE) ×4 IMPLANT
SPONGE SURGIFOAM ABS GEL 100 (HEMOSTASIS) IMPLANT
SUT PROLENE 6 0 BV (SUTURE) ×4 IMPLANT
SUT PROLENE 7 0 BV 1 (SUTURE) IMPLANT
SUT SILK 2 0 FS (SUTURE) IMPLANT
SUT VIC AB 3-0 SH 27 (SUTURE) ×2
SUT VIC AB 3-0 SH 27X BRD (SUTURE) ×2 IMPLANT
SUT VICRYL 4-0 PS2 18IN ABS (SUTURE) ×4 IMPLANT
SYR CONTROL 10ML LL (SYRINGE) ×4 IMPLANT
SYRINGE 20CC LL (MISCELLANEOUS) ×4 IMPLANT
WATER STERILE IRR 1000ML POUR (IV SOLUTION) ×4 IMPLANT

## 2016-01-13 NOTE — OR Nursing (Addendum)
Hearing aids times two removed by patient in OR 12. Placed in cup and labeled with patient stickers. Hearing aids transferred to Amy in Pacu spot 8 at case end.

## 2016-01-13 NOTE — Addendum Note (Signed)
Addendum  created 01/13/16 1320 by Finis Bud, MD   Modules edited: Anesthesia Attestations

## 2016-01-13 NOTE — Transfer of Care (Signed)
Immediate Anesthesia Transfer of Care Note  Patient: Sheila Briggs  Procedure(s) Performed: Procedure(s): ENDARTERECTOMY CAROTID RIGHT (Right) RIGHT CAROTID ARTERY PATCH ANGIOPLASTY USING 1CM X 6CM Draper BIOLOGIC PATCH  Patient Location: PACU  Anesthesia Type:General  Level of Consciousness: awake, alert  and oriented  Airway & Oxygen Therapy: Patient Spontanous Breathing and Patient connected to nasal cannula oxygen  Post-op Assessment: Report given to RN, Post -op Vital signs reviewed and stable and Patient moving all extremities X 4  Post vital signs: Reviewed and stable  Last Vitals:  Filed Vitals:   01/13/16 0640  BP: 157/51  Pulse: 70  Temp: 36.6 C  Resp: 18    Complications: No apparent anesthesia complications

## 2016-01-13 NOTE — Anesthesia Procedure Notes (Signed)
Procedure Name: Intubation Date/Time: 01/13/2016 8:03 AM Performed by: Garrison Columbus T Pre-anesthesia Checklist: Patient identified, Emergency Drugs available, Suction available and Patient being monitored Patient Re-evaluated:Patient Re-evaluated prior to inductionOxygen Delivery Method: Circle system utilized Preoxygenation: Pre-oxygenation with 100% oxygen Intubation Type: IV induction Ventilation: Mask ventilation without difficulty Laryngoscope Size: Glidescope and 3 Grade View: Grade I Tube type: Oral Tube size: 7.0 mm Number of attempts: 1 Airway Equipment and Method: Stylet and Video-laryngoscopy Placement Confirmation: ETT inserted through vocal cords under direct vision,  positive ETCO2 and breath sounds checked- equal and bilateral Secured at: 22 cm Tube secured with: Tape Dental Injury: Teeth and Oropharynx as per pre-operative assessment

## 2016-01-13 NOTE — Progress Notes (Signed)
   POSTOP NOTE:  * Doing well post op.   * Anticipate discharge in AM  SUBJECTIVE: Had mild headache post op which is improved.  PHYSICAL EXAM: Filed Vitals:   01/13/16 1140 01/13/16 1210 01/13/16 1310 01/13/16 1410  BP: 131/55 113/48 115/52 113/41  Pulse: 81 74 69 65  Temp:      TempSrc:      Resp: 17 14 15 15   Weight:      SpO2: 97% 96% 90% 95%   Neuro intact Incision looks fine.   LABS: Lab Results  Component Value Date   WBC 10.4 01/10/2016   HGB 14.9 01/10/2016   HCT 43.9 01/10/2016   MCV 89.8 01/10/2016   PLT 283 01/10/2016   Lab Results  Component Value Date   CREATININE 0.69 01/10/2016   Lab Results  Component Value Date   INR 1.01 01/10/2016   Active Problems:   Carotid stenosis  Gae Gallop Beeper: [id number] 01/13/2016

## 2016-01-13 NOTE — H&P (View-Only) (Signed)
[facility] [place]  Patient name: Sheila Briggs MRN: [id number] DOB: August 23, 1932 Sex: female  REASON FOR CONSULT: Right carotid stenosis. Referred by Dr. Willey Blade.  HPI: Sheila Briggs is a 82 y.o. female, who was being followed with a moderate right carotid stenosis. On her most recent follow up duplex, the stenosis had progressed to greater than 80%. She had a very tight right carotid stenosis and was sent for vascular consultation. She is right-handed. She states that he ago in December 2015, she had a mild stroke associated with left-sided weakness. She has not been on aspirin recently because of some bruising problems I believe. She denies any recent history of stroke, TIAs, expressive or receptive aphasia, or amaurosis fugax.  Have reviewed the records that were sent with the patient. Patient underwent a bilateral carotid duplex scan on 12/28/2015. This showed a greater than 80% right carotid stenosis with no significant stenosis on the left. Both vertebral arteries were patent with antegrade flow.  The patient also has a history of essential hypertension which is been under good control. In addition she has gastroesophageal reflux disease, hyperlipidemia, irritable bowel syndrome and history of hypothyroidism.  Past Medical History  Diagnosis Date  . Thyroid disease   . Hypertension   . Hypercholesteremia   . GERD (gastroesophageal reflux disease)   . Hypothyroidism   . Anxiety   . Cancer (HCC)     basal cell of scalp  . Arthritis   . HOH (hard of hearing)     History reviewed. No pertinent family history.  SOCIAL HISTORY: Social History   Social History  . Marital Status: Widowed    Spouse Name: N/A  . Number of Children: N/A  . Years of Education: N/A   Occupational History  . Not on file.   Social History Main Topics  . Smoking status: Never Smoker   . Smokeless tobacco: Never Used  . Alcohol Use: No  . Drug Use: No  . Sexual Activity:  Yes    Birth Control/ Protection: Surgical   Other Topics Concern  . Not on file   Social History Narrative    No Known Allergies  Current Outpatient Prescriptions  Medication Sig Dispense Refill  . amLODipine (NORVASC) 2.5 MG tablet Take 2.5 mg by mouth daily.     Marland Kitchen atorvastatin (LIPITOR) 40 MG tablet Take 1 tablet (40 mg total) by mouth daily. 30 tablet 12  . citalopram (CELEXA) 10 MG tablet Take 10 mg by mouth daily.    . diclofenac (CATAFLAM) 50 MG tablet Take 1 tablet (50 mg total) by mouth 2 (two) times daily. 60 tablet 1  . gabapentin (NEURONTIN) 100 MG capsule Take 1 capsule (100 mg total) by mouth 3 (three) times daily. 60 capsule 1  . hydrochlorothiazide (HYDRODIURIL) 12.5 MG tablet Take 12.5 mg by mouth daily.     . Multiple Vitamin (MULTIVITAMIN WITH MINERALS) TABS Take 1 tablet by mouth daily.    Vladimir Faster Glycol-Propyl Glycol (SYSTANE ULTRA OP) Apply 1 drop to eye 4 (four) times daily.    . ramipril (ALTACE) 2.5 MG capsule Take 1 capsule (2.5 mg total) by mouth daily. 30 capsule 12  . SYNTHROID 25 MCG tablet Take 25 mcg by mouth daily.     Marland Kitchen aspirin EC 325 MG tablet Take 1 tablet (325 mg total) by mouth daily. (Patient not taking: Reported on 01/05/2016) 30 tablet 0  . naproxen sodium (ALEVE) 220 MG tablet Take 220 mg by mouth  2 (two) times daily as needed. Reported on 01/05/2016     No current facility-administered medications for this visit.    REVIEW OF SYSTEMS:  [X]  denotes positive finding, [ ]  denotes negative finding Cardiac  Comments:  Chest pain or chest pressure:    Shortness of breath upon exertion:    Short of breath when lying flat:    Irregular heart rhythm:        Vascular    Pain in calf, thigh, or hip brought on by ambulation:    Pain in feet at night that wakes you up from your sleep:     Blood clot in your veins:    Leg swelling:         Pulmonary    Oxygen at home:    Productive cough:     Wheezing:         Neurologic    Sudden  weakness in arms or legs:     Numbness in arms or legs:  X She does occasionally get some paresthesias in her left arm which she attributes to cervical disc disease.  Sudden onset of difficulty speaking or slurred speech:    Temporary loss of vision in one eye:     Problems with dizziness:         Gastrointestinal    Blood in stool:     Vomited blood:         Genitourinary    Burning when urinating:     Blood in urine:        Psychiatric    Major depression:         Hematologic    Bleeding problems:    Problems with blood clotting too easily:        Skin    Rashes or ulcers:        Constitutional    Fever or chills:      PHYSICAL EXAM: Filed Vitals:   01/05/16 0912 01/05/16 0916 01/05/16 0922 01/05/16 0923  BP: 145/54 149/53 151/54 142/53  Pulse: 76 75 77 75  Temp:  97.9 F (36.6 C)    TempSrc:  Oral    Resp:  14    Height:  5\' 1"  (1.549 m)    Weight:  123 lb (55.792 kg)    SpO2:  98%      GENERAL: The patient is a well-nourished female, in no acute distress. The vital signs are documented above. CARDIAC: There is a regular rate and rhythm.  VASCULAR: she has a right carotid bruit. She has palpable dorsalis pedis pulses. PULMONARY: There is good air exchange bilaterally without wheezing or rales. ABDOMEN: Soft and non-tender with normal pitched bowel sounds.  MUSCULOSKELETAL: There are no major deformities or cyanosis. NEUROLOGIC: No focal weakness or paresthesias are detected. SKIN: There are no ulcers or rashes noted. PSYCHIATRIC: The patient has a normal affect.  DATA:   RIGHT CAROTID DUPLEX: I have independently interpreted the limited right carotid duplex scan our office today which shows a high-grade right carotid stenosis. End diastolic velocity is [id number] cm/s. The patient clearly has a greater than 80% right carotid stenosis.  MEDICAL ISSUES:  GREATER THAN 80% RIGHT CAROTID STENOSIS: She did have a right hemispheric stroke a year ago. At that time she  had only a mild stenosis. However the stenosis now progressed to greater than 80% and I would recommend right carotid endarterectomy. I have reviewed the indications for carotid endarterectomy, that is to lower the risk of future stroke.  I have also reviewed the potential complications of surgery, including but not limited to: bleeding, stroke (perioperative risk 1-2%), MI, nerve injury of other unpredictable medical problems. All of the patients questions were answered and they are agreeable to proceed with surgery. I have instructed her to begin taking 81 mg of aspirin daily. Her surgery is scheduled for 01/13/16.    [name] [facility] [place]: [phone]

## 2016-01-13 NOTE — Interval H&P Note (Signed)
History and Physical Interval Note:  01/13/2016 6:52 AM  Sheila Briggs  has presented today for surgery, with the diagnosis of Right carotid artery stenosis I65.21  The various methods of treatment have been discussed with the patient and family. After consideration of risks, benefits and other options for treatment, the patient has consented to  Procedure(s): ENDARTERECTOMY CAROTID (Right) as a surgical intervention .  The patient's history has been reviewed, patient examined, no change in status, stable for surgery.  I have reviewed the patient's chart and labs.  Questions were answered to the patient's satisfaction.     [name]

## 2016-01-13 NOTE — Op Note (Signed)
    NAME: [name]   MRN: [id number] DOB: 1932-03-12    DATE OF OPERATION: 01/13/2016  PREOP DIAGNOSIS: Greater than 80% right carotid stenosis  POSTOP DIAGNOSIS: Same  PROCEDURE: Right carotid endarterectomy with bovine pericardial patch angioplasty  SURGEON: Judeth Cornfield. Scot Dock, MD, FACS  ASSIST: Gerri Lins PA  ANESTHESIA: Gen.   EBL: Minimal  INDICATIONS: SARSHA HOFMANN is a 82 y.o. female Who presented with a greater than 80% right carotid stenosis. She had a previous Right hemispheric stroke in the past. Right carotid endarterectomy was recommended in order to lower her risk of future stroke.  FINDINGS: 90% right carotid stenosis  TECHNIQUE: The patient was taken to the operating room and received a general anesthetic. Arterial line had been placed by anesthesia. The right neck was prepped and draped in the usual sterile fashion. An incision was made along the anterior border of the sternocleidomastoid and dissection carried down to the common carotid artery which was dissected free and controlled with a Rummel tourniquet. The facial vein was divided between 2-0 silk ties. The patient was heparinized. The internal carotid artery was controlled above the plaque. The superior thyroid artery and external carotid arteries were controlled. Clamps were then placed on the internal then the external then the common carotid artery. A longitudinal arteriotomy was made and this was extended through the plaque into the normal internal carotid artery. A 10 shunt was placed into the internal carotid artery, backbled and then placed into the common carotid artery and secured with a Rummel tourniquet. Flow was reestablished to the shunt. An endarterectomy plane was established proximal vein the plaque was sharply divided. Eversion endarterectomy was performed of the external carotid artery. Distally it was a nice taper in the plaque and no tacking sutures were required. The artery was irrigated  with copious amounts of heparin and dextran and all loose debris was removed. The bovine pericardial patch was sewn using continuous 6-0 Prolene suture. Prior to completing the patch closure, the shunt was removed. The arteries were backbled and flushed appropriately and then the anastomosis completed. Flow was reestablished first to the external carotid artery and into the internal carotid artery. The heparin was partially reversed with protamine. Hemostasis was obtained in the wound. The wound was closed with a pair of 3-0 Vicryl. The platysma was closed with 3-0 Vicryl. The skin was closed with a 40 septic or stitch. Liquid bandage was applied. The patient tolerated the procedure well and will neurologically intact. She was transferred to the recovery room in stable condition. All needle and sponge counts were correct.  [name], MD, FACS [facility]  DATE OF DICTATION:   01/13/2016

## 2016-01-13 NOTE — Anesthesia Postprocedure Evaluation (Signed)
Anesthesia Post Note  Patient: Sheila Briggs  Procedure(s) Performed: Procedure(s) (LRB): ENDARTERECTOMY CAROTID RIGHT (Right) RIGHT CAROTID ARTERY PATCH ANGIOPLASTY USING 1CM X 6CM [place]  Patient location during evaluation: PACU Anesthesia Type: General Level of consciousness: awake Pain management: pain level controlled Vital Signs Assessment: post-procedure vital signs reviewed and stable Respiratory status: spontaneous breathing Cardiovascular status: stable Anesthetic complications: no    Last Vitals:  Filed Vitals:   01/13/16 1140 01/13/16 1210  BP: 131/55 113/48  Pulse: 81 74  Temp:    Resp: 17 14    Last Pain:  Filed Vitals:   01/13/16 1305  PainSc: 3                  EDWARDS,Jimi Schappert

## 2016-01-13 NOTE — Anesthesia Preprocedure Evaluation (Addendum)
Anesthesia Evaluation  Patient identified by MRN, date of birth, ID band Patient awake    Reviewed: Allergy & Precautions, NPO status , Patient's Chart, lab work & pertinent test results  Airway Mallampati: II  TM Distance: <3 FB Neck ROM: Full    Dental  (+) Dental Advisory Given, Teeth Intact   Pulmonary    breath sounds clear to auscultation       Cardiovascular hypertension, Pt. on medications  Rhythm:Regular Rate:Normal     Neuro/Psych CVA    GI/Hepatic GERD  Medicated,  Endo/Other  Hypothyroidism   Renal/GU      Musculoskeletal  (+) Arthritis ,   Abdominal   Peds  Hematology   Anesthesia Other Findings   Reproductive/Obstetrics                          Anesthesia Physical Anesthesia Plan  ASA: III  Anesthesia Plan: General   Post-op Pain Management:    Induction: Intravenous  Airway Management Planned: Oral ETT and Video Laryngoscope Planned  Additional Equipment: Arterial line  Intra-op Plan:   Post-operative Plan: Extubation in OR  Informed Consent: I have reviewed the patients History and Physical, chart, labs and discussed the procedure including the risks, benefits and alternatives for the proposed anesthesia with the patient or authorized representative who has indicated his/her understanding and acceptance.   Dental advisory given  Plan Discussed with: Anesthesiologist, Surgeon and CRNA  Anesthesia Plan Comments:        Anesthesia Quick Evaluation

## 2016-01-14 ENCOUNTER — Encounter (HOSPITAL_COMMUNITY): Payer: Self-pay | Admitting: Vascular Surgery

## 2016-01-14 LAB — BASIC METABOLIC PANEL
Anion gap: 14 (ref 5–15)
BUN: 7 mg/dL (ref 6–20)
CO2: 24 mmol/L (ref 22–32)
Calcium: 9.1 mg/dL (ref 8.9–10.3)
Chloride: 102 mmol/L (ref 101–111)
Creatinine, Ser: 0.47 mg/dL (ref 0.44–1.00)
GFR calc Af Amer: >60 mL/min (ref >60)
GFR calc non Af Amer: >60 mL/min (ref >60)
GLUCOSE: 157 mg/dL — AB (ref 65–99)
Potassium: 3.6 mmol/L (ref 3.5–5.1)
Sodium: 140 mmol/L (ref 135–145)

## 2016-01-14 LAB — CBC
HEMATOCRIT: 40.7 % (ref 36.0–46.0)
Hemoglobin: 13.5 g/dL (ref 12.0–15.0)
MCH: 29.5 pg (ref 26.0–34.0)
MCHC: 33.2 g/dL (ref 30.0–36.0)
MCV: 89.1 fL (ref 78.0–100.0)
PLATELETS: 258 10*3/uL (ref 150–400)
RBC: 4.57 MIL/uL (ref 3.87–5.11)
RDW: 13.8 % (ref 11.5–15.5)
WBC: 8.7 10*3/uL (ref 4.0–10.5)

## 2016-01-14 MED ORDER — MORPHINE SULFATE (PF) 2 MG/ML IV SOLN: 1.0000 mg | INTRAVENOUS | Status: DC | PRN

## 2016-01-14 NOTE — Progress Notes (Signed)
Patient ambulated approximately 100 feet with some effort using a front wheel walker.  Patient stated she felt "lightheaded" a few times during ambulation, taking multiple breaks. Objectively, her hands and arms were often shaky.  HR reached into the 120s. Oxygen saturation remained above 92% while on room air.  Patient now resting comfortably in chair.

## 2016-01-14 NOTE — Progress Notes (Signed)
Called report. Patient to transfer to 2 [place] 16 via wheelchair with belongings. Son notified of new room number.

## 2016-01-14 NOTE — Progress Notes (Signed)
Patient has been oriented to room, call light within reach, and is resting.

## 2016-01-14 NOTE — Care Management Note (Signed)
Case Management Note  Patient Details  Name: Sheila Briggs MRN: [id number] Date of Birth: 05/23/1932  Subjective/Objective:  Patient is s/p CEA, she lives home alone, she is weak after procedure, she has a walker, a high rise toilet seat and shower seat at home.  Her heart rate has been going up when she ambulates, she is not sure if she may need [facility] services she will let her MD determine this but she was asking questions about it so NCM gave her a list of [facility] agencies in [place] to look over.  Patient will probably be dc tomorrow.  NCM will continue to follow for dc needs.                   Action/Plan:   Expected Discharge Date:                  Expected Discharge Plan:  [place]  In-House Referral:     Discharge planning Services  CM Consult  Post Acute Care Choice:    Choice offered to:     DME Arranged:    DME Agency:     HH Arranged:    [place] Agency:     Status of Service:  In process, will continue to follow  Medicare Important Message Given:    Date Medicare IM Given:    Medicare IM give by:    Date Additional Medicare IM Given:    Additional Medicare Important Message give by:     If discussed at [place] of Stay Meetings, dates discussed:    Additional Comments:  Zenon Mayo, RN 01/14/2016, 2:38 PM

## 2016-01-14 NOTE — Progress Notes (Addendum)
  Progress Note  VASCULAR SURGERY ASSESSMENT AND PLAN:  Agree with note below. Incision looks fine. Neuro intact. Possibly home later today.  [name], MD, FACS [facility] [phone] Office: [phone]   01/14/2016 7:14 AM 1 Day Post-Op  Subjective:  Felt shaky this morning; c/o neck soreness if she moves her head; denies any trouble swallowing liquids.  afebrile HR 60's-80's NSR  [id number] systolic  [id number] RA  Gtts:  none  Filed Vitals:   01/14/16 0400 01/14/16 0600  BP: 146/58   Pulse: 89   Temp: 97.8 F (36.6 C)   Resp: 12 17     Physical Exam: Neuro:  In tact; tongue midline Lungs:  Non labored Incision:  Clean and dry with some fullness; ecchymosis around incision  CBC    Component Value Date/Time   WBC 8.7 01/14/2016 0520   RBC 4.57 01/14/2016 0520   HGB 13.5 01/14/2016 0520   HCT 40.7 01/14/2016 0520   PLT 258 01/14/2016 0520   MCV 89.1 01/14/2016 0520   MCH 29.5 01/14/2016 0520   MCHC 33.2 01/14/2016 0520   RDW 13.8 01/14/2016 0520   LYMPHSABS 1.8 10/13/2014 1404   MONOABS 0.7 10/13/2014 1404   EOSABS 0.1 10/13/2014 1404   BASOSABS 0.0 10/13/2014 1404    BMET    Component Value Date/Time   NA 140 01/14/2016 0520   K 3.6 01/14/2016 0520   CL 102 01/14/2016 0520   CO2 24 01/14/2016 0520   GLUCOSE 157* 01/14/2016 0520   BUN 7 01/14/2016 0520   CREATININE 0.47 01/14/2016 0520   CALCIUM 9.1 01/14/2016 0520   GFRNONAA >60 01/14/2016 0520   GFRAA >60 01/14/2016 0520     Intake/Output Summary (Last 24 hours) at 01/14/16 0714 Last data filed at 01/14/16 0600  Gross per 24 hour  Intake   2595 ml  Output   2750 ml  Net   -155 ml     Assessment/Plan:  This is a 82 y.o. female who is s/p right CEA 1 Day Post-Op  -pt is doing well this am, but a little shaky-ambulate more this morning, eat breakfast.  May be able to d/c later today. -pt neuro exam is in tact -pt has ambulated with walker needing multiple breaks -pt has voided -f/u  with Dr. Scot Dock in 2 weeks.  Leontine Locket, PA-C [facility] [phone]

## 2016-01-14 NOTE — Progress Notes (Addendum)
Patient arrived to 3S03 via bed from PACU.  Patient A&O, VSS.  ELink and CCMD notified of arrival.  Patient oriented to unit and call bell placed within reach.  Patient belongings include clothing, some puzzle books, and toiletry items.  These are placed in the chair in a pink bag next to the patient's bed. No family is present at this time.  Will continue to monitor.

## 2016-01-15 NOTE — Progress Notes (Signed)
Pt has been discharged home. Pt's vitals were stable prior to discharge. Pt ambulated in the hallway prior to discharge and tolerated it very well. Pt's IV and telemetry box were removed with no complications. Pt and pt's son received discharge instructions and all questions were answered. Pt left the floor via wheelchair and was accompanied by me. Pt was in no distress at time of discharge.   Grant Fontana RN, BSN

## 2016-01-15 NOTE — Progress Notes (Addendum)
[facility] [place]  Subjective  - Doing much better.   Objective 155/57 88 97.8 F (36.6 C) (Oral) 18 97%  Intake/Output Summary (Last 24 hours) at 01/15/16 0748 Last data filed at 01/15/16 0156  Gross per 24 hour  Intake   1085 ml  Output   1750 ml  Net   -665 ml    Palpable radial pulses, incision C/D/I No tongue deviation, and smile is symmetric    Assessment/Planning: POD # 2 [place] CEA D/C home today after ambulation and tolerating breakfast     Laurence Slate [facility] 01/15/2016 7:48 AM --  Laboratory Lab Results:  Recent Labs  01/13/16 2120 01/14/16 0520  WBC 9.7 8.7  HGB 13.6 13.5  HCT 40.7 40.7  PLT 257 258   BMET  Recent Labs  01/13/16 2120 01/14/16 0520  NA  --  140  K  --  3.6  CL  --  102  CO2  --  24  GLUCOSE  --  157*  BUN  --  7  CREATININE 0.67 0.47  CALCIUM  --  9.1    COAG Lab Results  Component Value Date   INR 1.01 01/10/2016   No results found for: PTT    Postoperative day 2, status post right carotid endarterectomy.  She feels much better today.  She still has some soreness around her incision.  She is neurologically intact.  Her son was present at the bedside.  We are anticipating discharge home today.  Annamarie Major

## 2016-01-18 ENCOUNTER — Telehealth: Payer: Self-pay | Admitting: Vascular Surgery

## 2016-01-18 NOTE — Telephone Encounter (Signed)
-----   Message from Mena Goes, RN sent at 01/13/2016  5:35 PM EST ----- Regarding: schedule   ----- Message -----    From: Gabriel Earing, PA-C    Sent: 01/13/2016   3:38 PM      To: [facility] Charge Pool  S/p right CEA 01/13/16.  F/u with [facility] in 2 weeks.

## 2016-01-18 NOTE — Telephone Encounter (Signed)
Spoke with pt to schedule, dpm °

## 2016-01-21 NOTE — Discharge Summary (Signed)
[facility] Discharge Summary   Patient ID:  Sheila Briggs MRN: [id number] DOB/AGE: 1931/11/24 82 y.o.  Admit date: 01/13/2016 Discharge date: 01/14/2016 Date of Surgery: 01/13/2016 Surgeon: Surgeon(s): Angelia Mould, MD  Admission Diagnosis: Right carotid artery stenosis I65.21  Discharge Diagnoses:  Right carotid artery stenosis I65.21  Secondary Diagnoses: Past Medical History  Diagnosis Date  . Thyroid disease   . Hypertension   . Hypercholesteremia   . GERD (gastroesophageal reflux disease)   . Hypothyroidism   . Anxiety   . Cancer (HCC)     basal cell of scalp  . Arthritis   . HOH (hard of hearing)   . Stroke ([place]) 2015    mild    Procedure(s): ENDARTERECTOMY CAROTID RIGHT RIGHT CAROTID ARTERY PATCH ANGIOPLASTY USING 1CM X 6CM XENOSURE BIOLOGIC PATCH  Discharged Condition: good  HPI: Sheila Briggs is a 82 y.o. female, who was being followed with a moderate right carotid stenosis. On her most recent follow up duplex, the stenosis had progressed to greater than 80%. She had a very tight right carotid stenosis and was sent for vascular consultation. She is right-handed. She states that he ago in December 2015, she had a mild stroke associated with left-sided weakness. She has not been on aspirin recently because of some bruising problems I believe. She denies any recent history of stroke, TIAs, expressive or receptive aphasia, or amaurosis fugax.  Have reviewed the records that were sent with the patient. Patient underwent a bilateral carotid duplex scan on 12/28/2015. This showed a greater than 80% right carotid stenosis with no significant stenosis on the left. Both vertebral arteries were patent with antegrade flow.  The patient also has a history of essential hypertension which is been under good control. In addition she has gastroesophageal reflux disease, hyperlipidemia, irritable bowel syndrome and history of hypothyroidism.   Hospital  Course:  Sheila Briggs is a 82 y.o. female is S/P  Procedure(s): ENDARTERECTOMY CAROTID RIGHT RIGHT CAROTID ARTERY PATCH ANGIOPLASTY USING 1CM X 6CM XENOSURE BIOLOGIC PATCH POD#1  -pt is doing well this am, but a little shaky-ambulate more this morning, eat breakfast. May be able to d/c later today. -pt neuro exam is in tact -pt has ambulated with walker needing multiple breaks -pt has voided -f/u with Dr. Scot Dock in 2 weeks.  Consults:     Significant Diagnostic Studies: CBC Lab Results  Component Value Date   WBC 8.7 01/14/2016   HGB 13.5 01/14/2016   HCT 40.7 01/14/2016   MCV 89.1 01/14/2016   PLT 258 01/14/2016    BMET    Component Value Date/Time   NA 140 01/14/2016 0520   K 3.6 01/14/2016 0520   CL 102 01/14/2016 0520   CO2 24 01/14/2016 0520   GLUCOSE 157* 01/14/2016 0520   BUN 7 01/14/2016 0520   CREATININE 0.47 01/14/2016 0520   CALCIUM 9.1 01/14/2016 0520   GFRNONAA >60 01/14/2016 0520   GFRAA >60 01/14/2016 0520   COAG Lab Results  Component Value Date   INR 1.01 01/10/2016     Disposition:  Discharge to :Home Discharge Instructions    CAROTID Sugery: Call MD for difficulty swallowing or speaking; weakness in arms or legs that is a new symtom; severe headache.  If you have increased swelling in the neck and/or  are having difficulty breathing, CALL 911    Complete by:  As directed      Call MD for:  redness, tenderness, or signs of infection (  pain, swelling, bleeding, redness, odor or green/yellow discharge around incision site)    Complete by:  As directed      Call MD for:  severe or increased pain, loss or decreased feeling  in affected limb(s)    Complete by:  As directed      Call MD for:  temperature >100.5    Complete by:  As directed      Discharge patient    Complete by:  As directed   Discharge pt to home     Discharge wound care:    Complete by:  As directed   Shower daily with soap and water starting 01/15/16     Driving  Restrictions    Complete by:  As directed   No driving for 2 weeks     Lifting restrictions    Complete by:  As directed   No lifting for 2 weeks     Resume previous diet    Complete by:  As directed             Medication List    TAKE these medications        ALEVE 220 MG tablet  Generic drug:  naproxen sodium  Take 220 mg by mouth 2 (two) times daily as needed. Reported on 01/05/2016     amLODipine 2.5 MG tablet  Commonly known as:  NORVASC  Take 2.5 mg by mouth daily.     aspirin EC 81 MG tablet  Take 81 mg by mouth daily.     atorvastatin 40 MG tablet  Commonly known as:  LIPITOR  Take 1 tablet (40 mg total) by mouth daily.     citalopram 10 MG tablet  Commonly known as:  CELEXA  Take 10 mg by mouth daily.     diclofenac 50 MG tablet  Commonly known as:  CATAFLAM  Take 1 tablet (50 mg total) by mouth 2 (two) times daily.     gabapentin 100 MG capsule  Commonly known as:  NEURONTIN  Take 1 capsule (100 mg total) by mouth 3 (three) times daily.     hydrochlorothiazide 12.5 MG tablet  Commonly known as:  HYDRODIURIL  Take 12.5 mg by mouth daily.     levothyroxine 25 MCG tablet  Commonly known as:  SYNTHROID, LEVOTHROID  Take 25 mcg by mouth daily before breakfast.     multivitamin with minerals Tabs tablet  Take 1 tablet by mouth daily.     ramipril 2.5 MG capsule  Commonly known as:  ALTACE  Take 1 capsule (2.5 mg total) by mouth daily.     SYSTANE ULTRA OP  Apply 1 drop to eye 4 (four) times daily.     traMADol 50 MG tablet  Commonly known as:  ULTRAM  Take 1 tablet (50 mg total) by mouth every 8 (eight) hours as needed.       Verbal and written Discharge instructions given to the patient. Wound care per Discharge AVS     Follow-up Information    Follow up with [name], MD In 2 weeks.   Specialties:  Vascular Surgery, Cardiology   Why:  Office will call you to arrange your appt (sent)   Contact information:   [place] [address] [phone]       Signed: [facility] 01/21/2016, 12:24 PM  --- For VQI Registry use --- Instructions: Press F2 to tab through selections.  Delete question if not applicable.   Modified Rankin score  at D/C (0-6): Rankin Score=0  IV medication needed for:  1. Hypertension: No 2. Hypotension: No  Post-op Complications: No  1. Post-op CVA or TIA: No  If yes: Event classification (right eye, left eye, right cortical, left cortical, verterobasilar, other):   If yes: Timing of event (intra-op, <6 hrs post-op, >=6 hrs post-op, unknown):   2. CN injury: No  If yes: CN  injuried   3. Myocardial infarction: No  If yes: Dx by (EKG or clinical, Troponin):   4.  CHF: No  5.  Dysrhythmia (new): No  6. Wound infection: No  7. Reperfusion symptoms: No  8. Return to OR: No  If yes: return to OR for (bleeding, neurologic, other CEA incision, other):   Discharge medications: Statin use:  Yes ASA use:  Yes Beta blocker use:  No  for medical reason   ACE-Inhibitor use:  Yes P2Y12 Antagonist use: [x ] None, [ ]  Plavix, [ ]  Plasugrel, [ ]  Ticlopinine, [ ]  Ticagrelor, [ ]  Other, [ ]  No for medical reason, [ ]  Non-compliant, [ ]  Not-indicated Anti-coagulant use:  [ ]  None, [ ]  Warfarin, [ ]  Rivaroxaban, [ ]  Dabigatran, [ ]  Other, [ ]  No for medical reason, [ ]  Non-compliant, [ ]  Not-indicated

## 2016-01-24 ENCOUNTER — Encounter: Payer: Self-pay | Admitting: Vascular Surgery

## 2016-01-27 ENCOUNTER — Encounter: Payer: Self-pay | Admitting: Vascular Surgery

## 2016-01-27 ENCOUNTER — Ambulatory Visit (INDEPENDENT_AMBULATORY_CARE_PROVIDER_SITE_OTHER): Payer: Medicare Other | Admitting: Vascular Surgery

## 2016-01-27 VITALS — BP 151/79 | HR 92 | Temp 97.7°F | Ht 61.0 in | Wt 124.0 lb

## 2016-01-27 DIAGNOSIS — Z48812 Encounter for surgical aftercare following surgery on the circulatory system: Secondary | ICD-10-CM

## 2016-01-27 NOTE — Progress Notes (Signed)
Patient name: Sheila Briggs MRN: [id number] DOB: 18-Mar-1932 Sex: female  REASON FOR VISIT: follow up after right carotid endarterectomy  VQI PATIENT  HPI: Sheila Briggs is a 82 y.o. female who presented with a greater than 80% right carotid stenosis. She had a previous right hemispheric stroke in the past. Right carotid endarterectomy was recommended in order to lower her risk of future stroke. She underwent a right carotid endarterectomy with bovine pericardial patch angioplasty on 3-17. She comes in for a 2 week follow up visit. She denies any specific complaints except some mild incisional tenderness. She's had no focal weakness or paresthesias.  Of note, the patient had no significant left carotid stenosis on her preoperative duplex.  She is on aspirin and is on a statin. She is not a smoker.  Current Outpatient Prescriptions  Medication Sig Dispense Refill  . amLODipine (NORVASC) 2.5 MG tablet Take 2.5 mg by mouth daily.     Marland Kitchen aspirin EC 81 MG tablet Take 81 mg by mouth daily.    Marland Kitchen atorvastatin (LIPITOR) 40 MG tablet Take 1 tablet (40 mg total) by mouth daily. 30 tablet 12  . citalopram (CELEXA) 10 MG tablet Take 10 mg by mouth daily.    . diclofenac (CATAFLAM) 50 MG tablet Take 1 tablet (50 mg total) by mouth 2 (two) times daily. 60 tablet 1  . gabapentin (NEURONTIN) 100 MG capsule Take 1 capsule (100 mg total) by mouth 3 (three) times daily. 60 capsule 1  . hydrochlorothiazide (HYDRODIURIL) 12.5 MG tablet Take 12.5 mg by mouth daily.     Marland Kitchen levothyroxine (SYNTHROID, LEVOTHROID) 25 MCG tablet Take 25 mcg by mouth daily before breakfast.    . Multiple Vitamin (MULTIVITAMIN WITH MINERALS) TABS Take 1 tablet by mouth daily.    . naproxen sodium (ALEVE) 220 MG tablet Take 220 mg by mouth 2 (two) times daily as needed. Reported on 01/05/2016    . Polyethyl Glycol-Propyl Glycol (SYSTANE ULTRA OP) Apply 1 drop to eye 4 (four) times daily.    . ramipril (ALTACE) 2.5 MG capsule Take 1 capsule  (2.5 mg total) by mouth daily. 30 capsule 12  . traMADol (ULTRAM) 50 MG tablet Take 1 tablet (50 mg total) by mouth every 8 (eight) hours as needed. 20 tablet 0   No current facility-administered medications for this visit.    REVIEW OF SYSTEMS:  [X]  denotes positive finding, [ ]  denotes negative finding Cardiac  Comments:  Chest pain or chest pressure:    Shortness of breath upon exertion:    Short of breath when lying flat:    Irregular heart rhythm:    Constitutional    Fever or chills:      PHYSICAL EXAM: Filed Vitals:   01/27/16 1347 01/27/16 1348  BP: 164/79 151/79  Pulse: 92   Temp: 97.7 F (36.5 C)   TempSrc: Oral   Height: 5\' 1"  (1.549 m)   Weight: 124 lb (56.246 kg)   SpO2: 98%     GENERAL: The patient is a well-nourished female, in no acute distress. The vital signs are documented above. CARDIOVASCULAR: There is a regular rate and rhythm. PULMONARY: There is good air exchange bilaterally without wheezing or rales. I do not detect carotid bruits. NEURO: He has no focal weakness or paresthesias.  MEDICAL ISSUES:  STATUS POST RIGHT CAROTID ENDARTERECTOMY: The patient is doing well status post right carotid endarterectomy. She had no significant stenosis on the left. I have ordered a follow up carotid  duplex scan in 6 months and I'll see her back at that time. She will then need a follow up study at one year and then yearly and can then follow up with our nurse practitioner. She is on aspirin and is on a statin. He is not a smoker. She knows to call sooner if she has problems.  [name] [facility] [place]: [phone]

## 2016-01-28 NOTE — Addendum Note (Signed)
Addended by: Thresa Ross C on: 01/28/2016 11:32 AM   Modules accepted: Orders

## 2016-02-08 DIAGNOSIS — J189 Pneumonia, unspecified organism: Secondary | ICD-10-CM | POA: Diagnosis not present

## 2016-02-16 ENCOUNTER — Encounter (HOSPITAL_COMMUNITY): Payer: Self-pay

## 2016-03-27 DIAGNOSIS — M1990 Unspecified osteoarthritis, unspecified site: Secondary | ICD-10-CM | POA: Diagnosis not present

## 2016-03-27 DIAGNOSIS — I10 Essential (primary) hypertension: Secondary | ICD-10-CM | POA: Diagnosis not present

## 2016-03-29 ENCOUNTER — Other Ambulatory Visit (HOSPITAL_COMMUNITY): Payer: Self-pay | Admitting: Internal Medicine

## 2016-03-29 DIAGNOSIS — Z1231 Encounter for screening mammogram for malignant neoplasm of breast: Secondary | ICD-10-CM

## 2016-04-05 ENCOUNTER — Ambulatory Visit (HOSPITAL_COMMUNITY)
Admission: RE | Admit: 2016-04-05 | Discharge: 2016-04-05 | Disposition: A | Discharge status: Home / Self Care | Payer: Medicare Other | Source: Ambulatory Visit | Attending: Internal Medicine | Admitting: Internal Medicine

## 2016-04-05 DIAGNOSIS — Z1231 Encounter for screening mammogram for malignant neoplasm of breast: Secondary | ICD-10-CM | POA: Diagnosis not present

## 2016-05-17 DIAGNOSIS — Z08 Encounter for follow-up examination after completed treatment for malignant neoplasm: Secondary | ICD-10-CM | POA: Diagnosis not present

## 2016-05-17 DIAGNOSIS — L821 Other seborrheic keratosis: Secondary | ICD-10-CM | POA: Diagnosis not present

## 2016-05-17 DIAGNOSIS — H61009 Unspecified perichondritis of external ear, unspecified ear: Secondary | ICD-10-CM | POA: Diagnosis not present

## 2016-05-17 DIAGNOSIS — Z85828 Personal history of other malignant neoplasm of skin: Secondary | ICD-10-CM | POA: Diagnosis not present

## 2016-06-01 DIAGNOSIS — I10 Essential (primary) hypertension: Secondary | ICD-10-CM | POA: Diagnosis not present

## 2016-07-04 DIAGNOSIS — E039 Hypothyroidism, unspecified: Secondary | ICD-10-CM | POA: Diagnosis not present

## 2016-07-04 DIAGNOSIS — K219 Gastro-esophageal reflux disease without esophagitis: Secondary | ICD-10-CM | POA: Diagnosis not present

## 2016-07-04 DIAGNOSIS — E785 Hyperlipidemia, unspecified: Secondary | ICD-10-CM | POA: Diagnosis not present

## 2016-07-04 DIAGNOSIS — I10 Essential (primary) hypertension: Secondary | ICD-10-CM | POA: Diagnosis not present

## 2016-07-04 DIAGNOSIS — Z79899 Other long term (current) drug therapy: Secondary | ICD-10-CM | POA: Diagnosis not present

## 2016-07-11 DIAGNOSIS — I10 Essential (primary) hypertension: Secondary | ICD-10-CM | POA: Diagnosis not present

## 2016-07-11 DIAGNOSIS — Z23 Encounter for immunization: Secondary | ICD-10-CM | POA: Diagnosis not present

## 2016-07-11 DIAGNOSIS — F3340 Major depressive disorder, recurrent, in remission, unspecified: Secondary | ICD-10-CM | POA: Diagnosis not present

## 2016-07-11 DIAGNOSIS — Z6824 Body mass index (BMI) 24.0-24.9, adult: Secondary | ICD-10-CM | POA: Diagnosis not present

## 2016-07-11 DIAGNOSIS — Z8673 Personal history of transient ischemic attack (TIA), and cerebral infarction without residual deficits: Secondary | ICD-10-CM | POA: Diagnosis not present

## 2016-07-27 ENCOUNTER — Encounter: Payer: Self-pay | Admitting: Vascular Surgery

## 2016-08-02 ENCOUNTER — Ambulatory Visit (INDEPENDENT_AMBULATORY_CARE_PROVIDER_SITE_OTHER): Payer: Medicare Other | Admitting: Vascular Surgery

## 2016-08-02 ENCOUNTER — Encounter: Payer: Self-pay | Admitting: Vascular Surgery

## 2016-08-02 ENCOUNTER — Ambulatory Visit (HOSPITAL_COMMUNITY)
Admission: RE | Admit: 2016-08-02 | Discharge: 2016-08-02 | Disposition: A | Discharge status: Home / Self Care | Payer: Medicare Other | Source: Ambulatory Visit | Attending: Vascular Surgery | Admitting: Vascular Surgery

## 2016-08-02 VITALS — BP 114/65 | HR 107 | Ht 61.0 in | Wt 118.7 lb

## 2016-08-02 DIAGNOSIS — Z48812 Encounter for surgical aftercare following surgery on the circulatory system: Secondary | ICD-10-CM

## 2016-08-02 DIAGNOSIS — I6521 Occlusion and stenosis of right carotid artery: Secondary | ICD-10-CM

## 2016-08-02 DIAGNOSIS — I6523 Occlusion and stenosis of bilateral carotid arteries: Secondary | ICD-10-CM | POA: Insufficient documentation

## 2016-08-02 NOTE — Progress Notes (Signed)
Patient name: Sheila Briggs MRN: [id number] DOB: 09-Feb-1932 Sex: female  REASON FOR VISIT: Follow up of carotid disease.  HPI: Sheila Briggs is a 82 y.o. female who I last saw on 01/27/2016. She underwent a right carotid endarterectomy with bovine pericardial patch angioplasty on 01/13/16. She comes in for a 6 month follow up visit. She denies any focal weakness or paresthesias. She denies expressive or receptive aphasia. She's had no amaurosis fugax. She is on aspirin and is on a statin. She is not a smoker.  Her only complaint has been some dizziness which she attributes to inner ear problems. She's had no recent changes in her medications.  Past Medical History:  Diagnosis Date  . Anxiety   . Arthritis   . Cancer (HCC)    basal cell of scalp  . GERD (gastroesophageal reflux disease)   . HOH (hard of hearing)   . Hypercholesteremia   . Hypertension   . Hypothyroidism   . Stroke ([place]) 2015   mild  . Thyroid disease     No family history on file.  SOCIAL HISTORY: Social History  Substance Use Topics  . Smoking status: Never Smoker  . Smokeless tobacco: Never Used  . Alcohol use No    No Known Allergies  Current Outpatient Prescriptions  Medication Sig Dispense Refill  . amLODipine (NORVASC) 2.5 MG tablet Take 2.5 mg by mouth daily.     Marland Kitchen aspirin EC 81 MG tablet Take 81 mg by mouth daily.    Marland Kitchen atorvastatin (LIPITOR) 40 MG tablet Take 1 tablet (40 mg total) by mouth daily. 30 tablet 12  . citalopram (CELEXA) 10 MG tablet Take 10 mg by mouth daily.    . diclofenac (CATAFLAM) 50 MG tablet Take 1 tablet (50 mg total) by mouth 2 (two) times daily. 60 tablet 1  . hydrochlorothiazide (HYDRODIURIL) 12.5 MG tablet Take 25 mg by mouth daily.     Marland Kitchen levothyroxine (SYNTHROID, LEVOTHROID) 25 MCG tablet Take 25 mcg by mouth daily before breakfast.    . Multiple Vitamin (MULTIVITAMIN WITH MINERALS) TABS Take 1 tablet by mouth daily.    Vladimir Faster Glycol-Propyl Glycol (SYSTANE ULTRA  OP) Apply 1 drop to eye 4 (four) times daily.    . ramipril (ALTACE) 2.5 MG capsule Take 1 capsule (2.5 mg total) by mouth daily. 30 capsule 12  . traMADol (ULTRAM) 50 MG tablet Take 1 tablet (50 mg total) by mouth every 8 (eight) hours as needed. 20 tablet 0  . gabapentin (NEURONTIN) 100 MG capsule Take 1 capsule (100 mg total) by mouth 3 (three) times daily. (Patient not taking: Reported on 08/02/2016) 60 capsule 1  . naproxen sodium (ALEVE) 220 MG tablet Take 220 mg by mouth 2 (two) times daily as needed. Reported on 01/05/2016     No current facility-administered medications for this visit.     REVIEW OF SYSTEMS:  [X]  denotes positive finding, [ ]  denotes negative finding Cardiac  Comments:  Chest pain or chest pressure:    Shortness of breath upon exertion:    Short of breath when lying flat:    Irregular heart rhythm:        Vascular    Pain in calf, thigh, or hip brought on by ambulation:    Pain in feet at night that wakes you up from your sleep:     Blood clot in your veins:    Leg swelling:         Pulmonary  Oxygen at home:    Productive cough:     Wheezing:         Neurologic    Sudden weakness in arms or legs:     Sudden numbness in arms or legs:     Sudden onset of difficulty speaking or slurred speech:    Temporary loss of vision in one eye:     Problems with dizziness:         Gastrointestinal    Blood in stool:     Vomited blood:         Genitourinary    Burning when urinating:     Blood in urine:        Psychiatric    Major depression:         Hematologic    Bleeding problems:    Problems with blood clotting too easily:        Skin    Rashes or ulcers:        Constitutional    Fever or chills:      PHYSICAL EXAM: Vitals:   08/02/16 1204 08/02/16 1205  BP: 114/69 114/65  Pulse: (!) 107   SpO2: 93%   Weight: 118 lb 11.2 oz (53.8 kg)   Height: 5\' 1"  (1.549 m)     GENERAL: The patient is a well-nourished female, in no acute distress.  The vital signs are documented above. CARDIAC: There is a regular rate and rhythm.  VASCULAR: I do not detect carotid bruits. Both feet are warm and well-perfused. PULMONARY: There is good air exchange bilaterally without wheezing or rales. ABDOMEN: Soft and non-tender with normal pitched bowel sounds.  MUSCULOSKELETAL: There are no major deformities or cyanosis. NEUROLOGIC: No focal weakness or paresthesias are detected. SKIN: There are no ulcers or rashes noted. PSYCHIATRIC: The patient has a normal affect.  DATA:   CAROTID DUPLEX: I have independently interpreted her carotid duplex scan today.  On the right side, the right carotid endarterectomy site is widely patent without evidence of restenosis.   There is a less than 40% left carotid stenosis. The left external carotid artery appears to be occluded proximally but this is clinically insignificant.  MEDICAL ISSUES:  STATUS POST RIGHT CAROTID ENDARTERECTOMY: The patient is doing well 6 months status post right carotid endarterectomy. She has no significant left carotid stenosis and no evidence of recurrent stenosis on the right. She has had some problems with dizziness but both vertebral arteries are also patent with antegrade flow. Thus I do not think her dizziness can be treated to vertebrobasilar disease or carotid disease. She is on aspirin and is on a statin. I've ordered a follow up carotid duplex can in 6 months. If that looks good then she will only need a yearly follow up study. She knows to call sooner if she has problems.  [name] [facility] [place] [phone]

## 2016-08-03 ENCOUNTER — Emergency Department (HOSPITAL_COMMUNITY)
Admission: EM | Admit: 2016-08-03 | Discharge: 2016-08-03 | Disposition: A | Discharge status: Home / Self Care | Payer: Medicare Other | Attending: Emergency Medicine | Admitting: Emergency Medicine

## 2016-08-03 ENCOUNTER — Emergency Department (HOSPITAL_COMMUNITY): Payer: Medicare Other

## 2016-08-03 ENCOUNTER — Encounter (HOSPITAL_COMMUNITY): Payer: Self-pay | Admitting: Cardiology

## 2016-08-03 DIAGNOSIS — E876 Hypokalemia: Secondary | ICD-10-CM | POA: Diagnosis not present

## 2016-08-03 DIAGNOSIS — I10 Essential (primary) hypertension: Secondary | ICD-10-CM | POA: Insufficient documentation

## 2016-08-03 DIAGNOSIS — E039 Hypothyroidism, unspecified: Secondary | ICD-10-CM | POA: Diagnosis not present

## 2016-08-03 DIAGNOSIS — E860 Dehydration: Secondary | ICD-10-CM | POA: Insufficient documentation

## 2016-08-03 DIAGNOSIS — Z8673 Personal history of transient ischemic attack (TIA), and cerebral infarction without residual deficits: Secondary | ICD-10-CM | POA: Insufficient documentation

## 2016-08-03 DIAGNOSIS — R42 Dizziness and giddiness: Secondary | ICD-10-CM | POA: Diagnosis not present

## 2016-08-03 DIAGNOSIS — Z79899 Other long term (current) drug therapy: Secondary | ICD-10-CM | POA: Diagnosis not present

## 2016-08-03 DIAGNOSIS — R05 Cough: Secondary | ICD-10-CM | POA: Diagnosis not present

## 2016-08-03 DIAGNOSIS — Z7982 Long term (current) use of aspirin: Secondary | ICD-10-CM | POA: Diagnosis not present

## 2016-08-03 DIAGNOSIS — H81399 Other peripheral vertigo, unspecified ear: Secondary | ICD-10-CM | POA: Diagnosis not present

## 2016-08-03 LAB — COMPREHENSIVE METABOLIC PANEL
ALK PHOS: 98 U/L (ref 38–126)
ALT: 115 U/L — AB (ref 14–54)
AST: 121 U/L — ABNORMAL HIGH (ref 15–41)
Albumin: 3.7 g/dL (ref 3.5–5.0)
Anion gap: 9 (ref 5–15)
BUN: 23 mg/dL — ABNORMAL HIGH (ref 6–20)
CALCIUM: 8.7 mg/dL — AB (ref 8.9–10.3)
CO2: 29 mmol/L (ref 22–32)
CREATININE: 1.15 mg/dL — AB (ref 0.44–1.00)
Chloride: 90 mmol/L — ABNORMAL LOW (ref 101–111)
GFR, EST AFRICAN AMERICAN: 50 mL/min — AB (ref >60)
GFR, EST NON AFRICAN AMERICAN: 43 mL/min — AB (ref >60)
Glucose, Bld: 121 mg/dL — ABNORMAL HIGH (ref 65–99)
Potassium: 2.8 mmol/L — ABNORMAL LOW (ref 3.5–5.1)
Sodium: 128 mmol/L — ABNORMAL LOW (ref 135–145)
Total Bilirubin: 0.8 mg/dL (ref 0.3–1.2)
Total Protein: 6.8 g/dL (ref 6.5–8.1)

## 2016-08-03 LAB — I-STAT CHEM 8, ED
BUN: 22 mg/dL — ABNORMAL HIGH (ref 6–20)
BUN: 18 mg/dL (ref 6–20)
CALCIUM ION: 1.06 mmol/L — AB (ref 1.15–1.40)
CREATININE: 1.2 mg/dL — AB (ref 0.44–1.00)
CREATININE: 0.9 mg/dL (ref 0.44–1.00)
Calcium, Ion: 1.01 mmol/L — ABNORMAL LOW (ref 1.15–1.40)
Chloride: 87 mmol/L — ABNORMAL LOW (ref 101–111)
Chloride: 92 mmol/L — ABNORMAL LOW (ref 101–111)
GLUCOSE: 117 mg/dL — AB (ref 65–99)
Glucose, Bld: 105 mg/dL — ABNORMAL HIGH (ref 65–99)
HCT: 42 % (ref 36.0–46.0)
HEMATOCRIT: 39 % (ref 36.0–46.0)
HEMOGLOBIN: 14.3 g/dL (ref 12.0–15.0)
HEMOGLOBIN: 13.3 g/dL (ref 12.0–15.0)
POTASSIUM: 2.7 mmol/L — AB (ref 3.5–5.1)
POTASSIUM: 3.3 mmol/L — AB (ref 3.5–5.1)
Sodium: 130 mmol/L — ABNORMAL LOW (ref 135–145)
Sodium: 130 mmol/L — ABNORMAL LOW (ref 135–145)
TCO2: 29 mmol/L (ref 0–100)
TCO2: 28 mmol/L (ref 0–100)

## 2016-08-03 LAB — URINE MICROSCOPIC-ADD ON

## 2016-08-03 LAB — DIFFERENTIAL
BASOS PCT: 1 %
Basophils Absolute: 0.1 10*3/uL (ref 0.0–0.1)
EOS ABS: 0 10*3/uL (ref 0.0–0.7)
Eosinophils Relative: 0 %
LYMPHS PCT: 15 %
Lymphs Abs: 0.8 10*3/uL (ref 0.7–4.0)
MONOS PCT: 12 %
Monocytes Absolute: 0.6 10*3/uL (ref 0.1–1.0)
NEUTROS ABS: 3.5 10*3/uL (ref 1.7–7.7)
NEUTROS PCT: 72 %
WBC Morphology: INCREASED

## 2016-08-03 LAB — URINALYSIS, ROUTINE W REFLEX MICROSCOPIC
Bilirubin Urine: NEGATIVE
GLUCOSE, UA: NEGATIVE mg/dL
KETONES UR: NEGATIVE mg/dL
Leukocytes, UA: NEGATIVE
Nitrite: NEGATIVE
PROTEIN: 30 mg/dL — AB
Specific Gravity, Urine: 1.01 (ref 1.005–1.030)
pH: 6 (ref 5.0–8.0)

## 2016-08-03 LAB — I-STAT TROPONIN, ED: TROPONIN I, POC: 0 ng/mL (ref 0.00–0.08)

## 2016-08-03 LAB — CBC
HCT: 39.5 % (ref 36.0–46.0)
HEMOGLOBIN: 13.3 g/dL (ref 12.0–15.0)
MCH: 29.6 pg (ref 26.0–34.0)
MCHC: 33.7 g/dL (ref 30.0–36.0)
MCV: 87.8 fL (ref 78.0–100.0)
Platelets: 108 10*3/uL — ABNORMAL LOW (ref 150–400)
RBC: 4.5 MIL/uL (ref 3.87–5.11)
RDW: 13.9 % (ref 11.5–15.5)
WBC: 5 10*3/uL (ref 4.0–10.5)

## 2016-08-03 LAB — ETHANOL: Alcohol, Ethyl (B): <5 mg/dL (ref <5)

## 2016-08-03 MED ORDER — MAGNESIUM SULFATE 2 GM/50ML IV SOLN
2.0000 g | Freq: Once | INTRAVENOUS | Status: AC
  Administered 2016-08-03: 2 g via INTRAVENOUS
  Filled 2016-08-03: qty 50

## 2016-08-03 MED ORDER — SODIUM CHLORIDE 0.9 % IV BOLUS (SEPSIS)
1000.0000 mL | Freq: Once | INTRAVENOUS | Status: AC
  Administered 2016-08-03: 1000 mL via INTRAVENOUS

## 2016-08-03 MED ORDER — POTASSIUM CHLORIDE CRYS ER 20 MEQ PO TBCR: 20.0000 meq | EXTENDED_RELEASE_TABLET | Freq: Two times a day (BID) | ORAL | 0 refills | Status: AC

## 2016-08-03 MED ORDER — MECLIZINE HCL 12.5 MG PO TABS: 12.5000 mg | ORAL_TABLET | Freq: Three times a day (TID) | ORAL | 0 refills | Status: DC | PRN

## 2016-08-03 MED ORDER — POTASSIUM CHLORIDE CRYS ER 20 MEQ PO TBCR
40.0000 meq | EXTENDED_RELEASE_TABLET | Freq: Once | ORAL | Status: AC
  Administered 2016-08-03: 40 meq via ORAL
  Filled 2016-08-03: qty 2

## 2016-08-03 MED ORDER — POTASSIUM CHLORIDE 10 MEQ/100ML IV SOLN
10.0000 meq | Freq: Once | INTRAVENOUS | Status: AC
  Administered 2016-08-03: 10 meq via INTRAVENOUS
  Filled 2016-08-03: qty 100

## 2016-08-03 MED ORDER — MECLIZINE HCL 12.5 MG PO TABS
12.5000 mg | ORAL_TABLET | Freq: Once | ORAL | Status: AC
  Administered 2016-08-03: 12.5 mg via ORAL
  Filled 2016-08-03: qty 1

## 2016-08-03 NOTE — ED Notes (Signed)
Pt taken to MRI. No changes.

## 2016-08-03 NOTE — ED Triage Notes (Signed)
Dizziness since yesterday.  Seen neurologist yesterday for check up and neurologist told her to F/U with PCP.  Seen Dr. Willey Blade this morning and was sent here to ER.   Also c/o cough since yesterday.

## 2016-08-03 NOTE — ED Provider Notes (Signed)
[facility] DEPT Provider Note   CSN: [id number] Arrival date & time: 08/03/16  1340  By signing my name below, I, Johnney Killian, attest that this documentation has been prepared under the direction and in the presence of Merrily Pew, MD. Electronically Signed: Johnney Killian, ED Scribe. 08/03/16. 2:17 PM.  History   Chief Complaint Chief Complaint  Patient presents with  . Dizziness    HPI Comments: Sheila Briggs is a 82 y.o. female who presents to the Emergency Department with her son complaining of gradually increasing dizziness for the last 3 days. Pt says she feels like the room is shaking and reports associated nausea. Per son, patient's gait has been wobbly and patiently resumed using a cane with ambulation. She says she has had postnasal drip, dry cough, low energy, subjective fever, chills, and orange urinary discoloration for the last 3-4 days. She was seen by vascular surgery yesterday and had negative carotid and vertebral [place], with no stenosis. She was seen by Dr. Willey Blade today in the office and was advised to come to the ED today. Patient reports a history of a "light" stroke in 2015 after which she had some right leg residual weakness and required the use of a cane for a period of time.  Pt takes 81 mg ASA but no other blood thinners. She also takes atorvastatin and denies taking any new antibiotic drugs. Pt takes a multivitamin but no other supplemental medications or remedies.  Pt denies vomiting, weakness or numbness.    The history is provided by the patient and a relative. No language interpreter was used.    Past Medical History:  Diagnosis Date  . Anxiety   . Arthritis   . Cancer (HCC)    basal cell of scalp  . GERD (gastroesophageal reflux disease)   . HOH (hard of hearing)   . Hypercholesteremia   . Hypertension   . Hypothyroidism   . Stroke ([place]) 2015   mild  . Thyroid disease     Patient Active Problem List   Diagnosis Date Noted  . Carotid  stenosis 01/13/2016  . CVA (cerebral infarction) 10/13/2014  . Arthritis of knee, degenerative 01/29/2014  . Hypothyroid 06/10/2012  . Hypertension   . 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 KNEE PAIN 03/15/2009  . IMPINGEMENT SYNDROME 03/11/2008  . SHOULDER PAIN 10/16/2007  . HIGH BLOOD PRESSURE 10/16/2007    Past Surgical History:  Procedure Laterality Date  . ABDOMINAL HYSTERECTOMY    . APPENDECTOMY    . BIOPSY THYROID     benign  . CATARACT EXTRACTION W/PHACO  12/19/2012   Procedure: CATARACT EXTRACTION PHACO AND INTRAOCULAR LENS PLACEMENT (IOC);  Surgeon: Tonny Branch, MD;  Location: [facility];  Service: Ophthalmology;  Laterality: Left;  CDE 13.75  . CATARACT EXTRACTION W/PHACO Right 01/06/2013   Procedure: CATARACT EXTRACTION PHACO AND INTRAOCULAR LENS PLACEMENT (IOC);  Surgeon: Tonny Branch, MD;  Location: [facility];  Service: Ophthalmology;  Laterality: Right;  CDE: 9.96  . CHOLECYSTECTOMY    . ENDARTERECTOMY Right 01/13/2016   Procedure: ENDARTERECTOMY CAROTID RIGHT;  Surgeon: Angelia Mould, MD;  Location: [place] [facility];  Service: Vascular;  Laterality: Right;  . PATCH ANGIOPLASTY  01/13/2016   Procedure: RIGHT CAROTID ARTERY PATCH ANGIOPLASTY USING 1CM X 6CM XENOSURE BIOLOGIC PATCH;  Surgeon: Angelia Mould, MD;  Location: [facility];  Service: Vascular;;  . TONSILLECTOMY      OB History    No data available       Home Medications  Prior to Admission medications   Medication Sig Start Date End Date Taking? Authorizing Provider  amLODipine (NORVASC) 2.5 MG tablet Take 2.5 mg by mouth daily.  02/07/11   Historical Provider, MD  aspirin EC 81 MG tablet Take 81 mg by mouth daily.    Historical Provider, MD  atorvastatin (LIPITOR) 40 MG tablet Take 1 tablet (40 mg total) by mouth daily. 10/15/14   Asencion Noble, MD  citalopram (CELEXA) 10 MG tablet Take 10 mg by mouth daily.    Historical Provider, MD  diclofenac (CATAFLAM) 50 MG tablet Take 1 tablet (50 mg total) by mouth 2 (two) times  daily. 12/17/14   Carole Civil, MD  gabapentin (NEURONTIN) 100 MG capsule Take 1 capsule (100 mg total) by mouth 3 (three) times daily. Patient not taking: Reported on 08/02/2016 12/17/14   Carole Civil, MD  hydrochlorothiazide (HYDRODIURIL) 12.5 MG tablet Take 25 mg by mouth daily.  03/20/11   Historical Provider, MD  levothyroxine (SYNTHROID, LEVOTHROID) 25 MCG tablet Take 25 mcg by mouth daily before breakfast.    Historical Provider, MD  meclizine (ANTIVERT) 12.5 MG tablet Take 1 tablet (12.5 mg total) by mouth 3 (three) times daily as needed for dizziness. 08/03/16   Merrily Pew, MD  Multiple Vitamin (MULTIVITAMIN WITH MINERALS) TABS Take 1 tablet by mouth daily.    Historical Provider, MD  naproxen sodium (ALEVE) 220 MG tablet Take 220 mg by mouth 2 (two) times daily as needed. Reported on 01/05/2016    Historical Provider, MD  Polyethyl Glycol-Propyl Glycol (SYSTANE ULTRA OP) Apply 1 drop to eye 4 (four) times daily.    Historical Provider, MD  potassium chloride SA (K-DUR,KLOR-CON) 20 MEQ tablet Take 1 tablet (20 mEq total) by mouth 2 (two) times daily. 08/03/16   Merrily Pew, MD  ramipril (ALTACE) 2.5 MG capsule Take 1 capsule (2.5 mg total) by mouth daily. 10/15/14   Asencion Noble, MD  traMADol (ULTRAM) 50 MG tablet Take 1 tablet (50 mg total) by mouth every 8 (eight) hours as needed. 01/13/16   Gabriel Earing, PA-C    Family History History reviewed. No pertinent family history.  Social History Social History  Substance Use Topics  . Smoking status: Never Smoker  . Smokeless tobacco: Never Used  . Alcohol use No     Allergies   Review of patient's allergies indicates no known allergies.   Review of Systems Review of Systems  Constitutional: Positive for chills and fatigue. Negative for fever (subjective).  HENT: Positive for postnasal drip.   Respiratory: Positive for cough.   Gastrointestinal: Positive for nausea. Negative for vomiting.  Genitourinary: Negative for  dysuria.  Musculoskeletal: Positive for gait problem.  Neurological: Positive for dizziness. Negative for weakness and numbness.  All other systems reviewed and are negative.    Physical Exam Updated Vital Signs BP (!) 113/47   Pulse 88   Temp 98.9 F (37.2 C)   Resp 18   Ht 5\' 1"  (1.549 m)   Wt 118 lb (53.5 kg)   SpO2 92%   BMI 22.30 kg/m   Physical Exam  Constitutional: She is oriented to person, place, and time. She appears well-developed and well-nourished.  HENT:  Head: Normocephalic and atraumatic.  Posterior oropharyngeal erythema  Cardiovascular: Normal rate, regular rhythm and normal heart sounds.  Exam reveals no gallop and no friction rub.   No murmur heard. Pulmonary/Chest: Effort normal and breath sounds normal. No respiratory distress. She has no wheezes. She has no  rales.  Neurological: She is alert and oriented to person, place, and time. She has normal reflexes. No cranial nerve deficit.  CN intact BUE sensation and strength intact Finger-nose test normal, slight dysdiadochokinesia in the right hand Normal and equal DTR biceps BLE strength and sensation normal Heel-shin test normal Rapid alternating movements without difficulty Gait slow with right-sided limp and right foot external rotation  Skin: Skin is warm and dry.  Psychiatric: She has a normal mood and affect.  Nursing note and vitals reviewed.    ED Treatments / Results   DIAGNOSTIC STUDIES: Oxygen Saturation is 96% on RA, adequate by my interpretation.    COORDINATION OF CARE: 1:50 PM Per phone call with Dr. Willey Blade: paitent showed significant difficulty with finger-nose test as well as ataxic gait, truncal ataxia, and leg-swinging gait   Labs (all labs ordered are listed, but only abnormal results are displayed) Labs Reviewed  CBC - Abnormal; Notable for the following:       Result Value   Platelets 108 (*)    All other components within normal limits  COMPREHENSIVE METABOLIC PANEL -  Abnormal; Notable for the following:    Sodium 128 (*)    Potassium 2.8 (*)    Chloride 90 (*)    Glucose, Bld 121 (*)    BUN 23 (*)    Creatinine, Ser 1.15 (*)    Calcium 8.7 (*)    AST 121 (*)    ALT 115 (*)    GFR calc non Af Amer 43 (*)    GFR calc Af Amer 50 (*)    All other components within normal limits  URINALYSIS, ROUTINE W REFLEX MICROSCOPIC (NOT AT [facility]) - Abnormal; Notable for the following:    Hgb urine dipstick TRACE (*)    Protein, ur 30 (*)    All other components within normal limits  URINE MICROSCOPIC-ADD ON - Abnormal; Notable for the following:    Squamous Epithelial / LPF 0-5 (*)    Bacteria, UA MANY (*)    Casts HYALINE CASTS (*)    All other components within normal limits  I-STAT CHEM 8, ED - Abnormal; Notable for the following:    Sodium 130 (*)    Potassium 2.7 (*)    Chloride 87 (*)    BUN 22 (*)    Creatinine, Ser 1.20 (*)    Glucose, Bld 117 (*)    Calcium, Ion 1.06 (*)    All other components within normal limits  I-STAT CHEM 8, ED - Abnormal; Notable for the following:    Sodium 130 (*)    Potassium 3.3 (*)    Chloride 92 (*)    Glucose, Bld 105 (*)    Calcium, Ion 1.01 (*)    All other components within normal limits  ETHANOL  DIFFERENTIAL  I-STAT TROPOININ, ED    EKG  EKG Interpretation None       Radiology Dg Chest 2 View  Result Date: 08/03/2016 CLINICAL DATA:  Cough and dizziness since yesterday, hypertension, hypercholesterolemia EXAM: CHEST  2 VIEW COMPARISON:  04/02/2014 FINDINGS: Minimal enlargement of cardiac silhouette. Mediastinal contours and pulmonary vascularity normal. Bronchitic changes and hyperinflation. Bibasilar atelectasis. No definite acute infiltrate, pleural effusion or pneumothorax. Bones demineralized. IMPRESSION: Bronchitic changes and hyperinflation with bibasilar atelectasis. Electronically Signed   By: Lavonia Dana M.D.   On: 08/03/2016 15:34   Mr Brain Wo Contrast  Result Date: 08/03/2016 CLINICAL  DATA:  Dizziness since yesterday EXAM: MRI HEAD WITHOUT CONTRAST  TECHNIQUE: Multiplanar, multiecho pulse sequences of the brain and surrounding structures were obtained without intravenous contrast. COMPARISON:  MRI head 10/13/2014 FINDINGS: Brain: Mild atrophy. Bifrontal extra axial CSF fluid collections unchanged from the prior MRI. Negative for hydrocephalus. Negative for acute infarct. Moderate chronic microvascular ischemic changes have progressed since 2015 throughout the cerebral white matter. Chronic infarct right posterior limb external capsule and right thalamus unchanged. Brainstem and cerebellum intact. Vascular: Normal flow voids. Skull and upper cervical spine: Negative Sinuses/Orbits: Bilateral lens replacement. No orbital mass. Paranasal sinuses clear. Other: Normal soft tissues IMPRESSION: Progressive chronic microvascular ischemic changes in the white matter. No acute infarct. Electronically Signed   By: Franchot Gallo M.D.   On: 08/03/2016 15:43    Procedures Procedures (including critical care time)  Medications Ordered in ED Medications  sodium chloride 0.9 % bolus 1,000 mL (0 mLs Intravenous Stopped 08/03/16 1702)  meclizine (ANTIVERT) tablet 12.5 mg (12.5 mg Oral Given 08/03/16 1447)  potassium chloride SA (K-DUR,KLOR-CON) CR tablet 40 mEq (40 mEq Oral Given 08/03/16 1656)  potassium chloride 10 mEq in 100 mL IVPB (0 mEq Intravenous Stopped 08/03/16 1754)  magnesium sulfate IVPB 2 g 50 mL (0 g Intravenous Stopped 08/03/16 1746)     Initial Impression / Assessment and Plan / ED Course  I have reviewed the triage vital signs and the nursing notes.  Pertinent labs & imaging results that were available during my care of the patient were reviewed by me and considered in my medical decision making (see chart for details).  Clinical Course    82 year old female with vertigo and ataxia at her primary doctor's office concerning for possible stroke with a history of strokes. MRI done  and was negative for that. She did have a potassium of 2.7 which could be affecting her vertigo and lightheadedness. Repleted and recheck was 3.3 we'll discharge on potassium. This is likely low secondary to the hydrochlorothiazide. Will need follow-up with her primary doctor to assess the need for continued potassium supplementation while on this medication. No e/o of other infectious process at this time.   Final Clinical Impressions(s) / ED Diagnoses   Final diagnoses:  Hypokalemia  Dehydration  Peripheral vertigo, unspecified laterality    New Prescriptions New Prescriptions   MECLIZINE (ANTIVERT) 12.5 MG TABLET    Take 1 tablet (12.5 mg total) by mouth 3 (three) times daily as needed for dizziness.   POTASSIUM CHLORIDE SA (K-DUR,KLOR-CON) 20 MEQ TABLET    Take 1 tablet (20 mEq total) by mouth 2 (two) times daily.   I personally performed the services described in this documentation, which was scribed in my presence. The recorded information has been reviewed and is accurate.      Merrily Pew, MD 08/03/16 [phone]

## 2016-08-11 DIAGNOSIS — E876 Hypokalemia: Secondary | ICD-10-CM | POA: Diagnosis not present

## 2016-08-11 DIAGNOSIS — R42 Dizziness and giddiness: Secondary | ICD-10-CM | POA: Diagnosis not present

## 2016-08-11 DIAGNOSIS — Z23 Encounter for immunization: Secondary | ICD-10-CM | POA: Diagnosis not present

## 2016-08-15 DIAGNOSIS — E876 Hypokalemia: Secondary | ICD-10-CM | POA: Diagnosis not present

## 2016-08-23 DIAGNOSIS — H43813 Vitreous degeneration, bilateral: Secondary | ICD-10-CM | POA: Diagnosis not present

## 2016-08-23 DIAGNOSIS — Z9849 Cataract extraction status, unspecified eye: Secondary | ICD-10-CM | POA: Diagnosis not present

## 2016-08-23 DIAGNOSIS — H35033 Hypertensive retinopathy, bilateral: Secondary | ICD-10-CM | POA: Diagnosis not present

## 2016-08-23 DIAGNOSIS — Z961 Presence of intraocular lens: Secondary | ICD-10-CM | POA: Diagnosis not present

## 2016-08-23 DIAGNOSIS — I10 Essential (primary) hypertension: Secondary | ICD-10-CM | POA: Diagnosis not present

## 2016-10-17 NOTE — Addendum Note (Signed)
Addended by: Lianne Cure A on: 10/17/2016 10:03 AM   Modules accepted: Orders

## 2016-11-16 DIAGNOSIS — I10 Essential (primary) hypertension: Secondary | ICD-10-CM | POA: Diagnosis not present

## 2016-11-16 DIAGNOSIS — Z6824 Body mass index (BMI) 24.0-24.9, adult: Secondary | ICD-10-CM | POA: Diagnosis not present

## 2016-11-16 DIAGNOSIS — E785 Hyperlipidemia, unspecified: Secondary | ICD-10-CM | POA: Diagnosis not present

## 2016-11-16 DIAGNOSIS — Z8673 Personal history of transient ischemic attack (TIA), and cerebral infarction without residual deficits: Secondary | ICD-10-CM | POA: Diagnosis not present

## 2016-11-21 ENCOUNTER — Other Ambulatory Visit (INDEPENDENT_AMBULATORY_CARE_PROVIDER_SITE_OTHER): Payer: Self-pay | Admitting: Otolaryngology

## 2016-11-21 DIAGNOSIS — E041 Nontoxic single thyroid nodule: Secondary | ICD-10-CM

## 2016-11-22 DIAGNOSIS — Z85828 Personal history of other malignant neoplasm of skin: Secondary | ICD-10-CM | POA: Diagnosis not present

## 2016-11-22 DIAGNOSIS — Z08 Encounter for follow-up examination after completed treatment for malignant neoplasm: Secondary | ICD-10-CM | POA: Diagnosis not present

## 2016-11-22 DIAGNOSIS — D235 Other benign neoplasm of skin of trunk: Secondary | ICD-10-CM | POA: Diagnosis not present

## 2016-11-22 DIAGNOSIS — X32XXXD Exposure to sunlight, subsequent encounter: Secondary | ICD-10-CM | POA: Diagnosis not present

## 2016-11-22 DIAGNOSIS — L570 Actinic keratosis: Secondary | ICD-10-CM | POA: Diagnosis not present

## 2016-11-23 DIAGNOSIS — H47023 Hemorrhage in optic nerve sheath, bilateral: Secondary | ICD-10-CM | POA: Diagnosis not present

## 2016-11-29 ENCOUNTER — Ambulatory Visit (HOSPITAL_COMMUNITY)
Admission: RE | Admit: 2016-11-29 | Discharge: 2016-11-29 | Disposition: A | Discharge status: Home / Self Care | Payer: Medicare Other | Source: Ambulatory Visit | Attending: Otolaryngology | Admitting: Otolaryngology

## 2016-11-29 DIAGNOSIS — E042 Nontoxic multinodular goiter: Secondary | ICD-10-CM | POA: Diagnosis not present

## 2016-11-29 DIAGNOSIS — E041 Nontoxic single thyroid nodule: Secondary | ICD-10-CM

## 2016-12-07 ENCOUNTER — Ambulatory Visit (INDEPENDENT_AMBULATORY_CARE_PROVIDER_SITE_OTHER): Payer: Medicare Other | Admitting: Otolaryngology

## 2016-12-07 DIAGNOSIS — D440 Neoplasm of uncertain behavior of thyroid gland: Secondary | ICD-10-CM | POA: Diagnosis not present

## 2017-01-25 ENCOUNTER — Encounter: Payer: Self-pay | Admitting: Vascular Surgery

## 2017-02-07 ENCOUNTER — Encounter: Payer: Self-pay | Admitting: Vascular Surgery

## 2017-02-07 ENCOUNTER — Ambulatory Visit (HOSPITAL_COMMUNITY)
Admission: RE | Admit: 2017-02-07 | Discharge: 2017-02-07 | Disposition: A | Discharge status: Home / Self Care | Payer: Medicare Other | Source: Ambulatory Visit | Attending: Vascular Surgery | Admitting: Vascular Surgery

## 2017-02-07 ENCOUNTER — Ambulatory Visit (INDEPENDENT_AMBULATORY_CARE_PROVIDER_SITE_OTHER): Payer: Medicare Other | Admitting: Vascular Surgery

## 2017-02-07 VITALS — BP 175/74 | HR 56 | Resp 16 | Ht 61.0 in | Wt 120.6 lb

## 2017-02-07 DIAGNOSIS — I6521 Occlusion and stenosis of right carotid artery: Secondary | ICD-10-CM

## 2017-02-07 DIAGNOSIS — Z9889 Other specified postprocedural states: Secondary | ICD-10-CM | POA: Diagnosis not present

## 2017-02-07 DIAGNOSIS — I6522 Occlusion and stenosis of left carotid artery: Secondary | ICD-10-CM | POA: Diagnosis not present

## 2017-02-07 LAB — VAS US CAROTID
LCCADDIAS: 14 cm/s
LCCADSYS: 52 cm/s
LEFT ECA DIAS: -2 cm/s
LEFT VERTEBRAL DIAS: 15 cm/s
LICAPDIAS: -15 cm/s
Left CCA prox dias: 16 cm/s
Left CCA prox sys: 97 cm/s
Left ICA prox sys: -58 cm/s
RCCADSYS: -96 cm/s
RCCAPDIAS: 11 cm/s
RIGHT CCA MID DIAS: 15 cm/s
RIGHT ECA DIAS: -18 cm/s
RIGHT VERTEBRAL DIAS: -13 cm/s
Right CCA prox sys: 104 cm/s

## 2017-02-07 NOTE — Progress Notes (Signed)
[facility]  Patient name: Sheila Briggs MRN: [id number] DOB: 10/31/1932 Sex: female  REASON FOR VISIT: follow-up  HPI: Sheila Briggs is a 83 y.o. female who presents for continued follow-up s/p right carotid endarterectomy on 01/13/16 with Dr. Scot Dock. This was done for asymptomatic right carotid stenosis. She denies any amaurosis fugax, sudden weakness or numbness of the extremities, expressive or receptive aphasia.   She does note some continued dizziness which in the past she attributed to inner ear problems. She did go to the ED following a "different" kind of dizziness episode and work-up revealed dehydration and electrolyte imbalances.   She is currently on ASA and a statin. She has never been a smoker. She denies any changes in her medical history since being seen six months ago.   Past Medical History:  Diagnosis Date  . Anxiety   . Arthritis   . Cancer (HCC)    basal cell of scalp  . GERD (gastroesophageal reflux disease)   . HOH (hard of hearing)   . Hypercholesteremia   . Hypertension   . Hypothyroidism   . Stroke ([place]) 2015   mild  . Thyroid disease     History reviewed. No pertinent family history.  SOCIAL HISTORY: Social History  Substance Use Topics  . Smoking status: Never Smoker  . Smokeless tobacco: Never Used  . Alcohol use No    No Known Allergies  Current Outpatient Prescriptions  Medication Sig Dispense Refill  . amLODipine (NORVASC) 2.5 MG tablet Take 5 mg by mouth daily.     Marland Kitchen aspirin EC 81 MG tablet Take 81 mg by mouth daily.    Marland Kitchen atorvastatin (LIPITOR) 40 MG tablet Take 1 tablet (40 mg total) by mouth daily. 30 tablet 12  . citalopram (CELEXA) 10 MG tablet Take 10 mg by mouth daily.    Marland Kitchen levothyroxine (SYNTHROID, LEVOTHROID) 25 MCG tablet Take 25 mcg by mouth daily before breakfast.    . Multiple Vitamin (MULTIVITAMIN WITH MINERALS) TABS Take 1 tablet by mouth daily.    . potassium chloride SA  (K-DUR,KLOR-CON) 20 MEQ tablet Take 1 tablet (20 mEq total) by mouth 2 (two) times daily. 30 tablet 0  . ramipril (ALTACE) 2.5 MG capsule Take 1 capsule (2.5 mg total) by mouth daily. (Patient taking differently: Take 5 mg by mouth daily. ) 30 capsule 12  . diclofenac (CATAFLAM) 50 MG tablet Take 1 tablet (50 mg total) by mouth 2 (two) times daily. (Patient not taking: Reported on 02/07/2017) 60 tablet 1  . gabapentin (NEURONTIN) 100 MG capsule Take 1 capsule (100 mg total) by mouth 3 (three) times daily. (Patient not taking: Reported on 08/02/2016) 60 capsule 1  . hydrochlorothiazide (HYDRODIURIL) 12.5 MG tablet Take 25 mg by mouth daily.     . meclizine (ANTIVERT) 12.5 MG tablet Take 1 tablet (12.5 mg total) by mouth 3 (three) times daily as needed for dizziness. (Patient not taking: Reported on 02/07/2017) 30 tablet 0  . naproxen sodium (ALEVE) 220 MG tablet Take 220 mg by mouth 2 (two) times daily as needed. Reported on 01/05/2016    . Polyethyl Glycol-Propyl Glycol (SYSTANE ULTRA OP) Apply 1 drop to eye 4 (four) times daily.    . traMADol (ULTRAM) 50 MG tablet Take 1 tablet (50 mg total) by mouth every 8 (eight) hours as needed. (Patient not taking: Reported on 02/07/2017) 20 tablet 0   No current facility-administered medications for this visit.     REVIEW  OF SYSTEMS:  [X]  denotes positive finding, [ ]  denotes negative finding Cardiac  Comments:  Chest pain or chest pressure:    Shortness of breath upon exertion:    Short of breath when lying flat:    Irregular heart rhythm:        Vascular    Pain in calf, thigh, or hip brought on by ambulation:    Pain in feet at night that wakes you up from your sleep:     Blood clot in your veins:    Leg swelling:         Pulmonary    Oxygen at home:    Productive cough:     Wheezing:         Neurologic    Sudden weakness in arms or legs:     Sudden numbness in arms or legs:     Sudden onset of difficulty speaking or slurred speech:      Temporary loss of vision in one eye:     Problems with dizziness:         Gastrointestinal    Blood in stool:     Vomited blood:         Genitourinary    Burning when urinating:     Blood in urine:        Psychiatric    Major depression:         Hematologic    Bleeding problems:    Problems with blood clotting too easily:        Skin    Rashes or ulcers:        Constitutional    Fever or chills:      PHYSICAL EXAM: Vitals:   02/07/17 1452 02/07/17 1455  BP: (!) 176/77 (!) 175/74  Pulse: (!) 56   Resp: 16   SpO2: 100%   Weight: 120 lb 9.6 oz (54.7 kg)   Height: 5\' 1"  (1.549 m)     GENERAL: The patient is a well-nourished female, in no acute distress. The vital signs are documented above. CARDIAC: There is a regular rate and rhythm. Occasional PVCs.  No carotid bruits.  VASCULAR: 2+ radial pulses bilaterally. Equal and symmetric.  PULMONARY: There is good air exchange bilaterally without wheezing or rales. MUSCULOSKELETAL: There are no major deformities or cyanosis. NEUROLOGIC: 5/5 strength upper and lower extremities bilaterally. SKIN: There are no ulcers or rashes noted. PSYCHIATRIC: The patient has a normal affect.  DATA:  Carotid duplex 02/07/17  Right carotid endarterectomy site is patent without restenosis Less than 40% left internal carotid artery stenosis  Vertebral arteries patent with antegrade flow bilaterally.   MEDICAL ISSUES: s/p right carotid endarterectomy  The patient continues to do well after her right carotid endarterectomy. Her carotid duplex has been stable since six months ago. There is no restenosis on the right and less than 40% on the left. She denies any TIA or stroke symptoms. She is on maximal medical management with aspirin and a statin. She will follow-up in one year with repeat carotid duplex.   Virgina Jock, PA-C [facility] MD: Scot Dock

## 2017-02-08 NOTE — Addendum Note (Signed)
Addended by: Lianne Cure A on: 02/08/2017 09:44 AM   Modules accepted: Orders

## 2017-03-06 IMAGING — DX DG CHEST 2V
2 series · 2 of 2 positions shown · non-contrast
Comparison: 04/02/2014

CLINICAL DATA: Cough and dizziness since yesterday, hypertension,
hypercholesterolemia

EXAM:
CHEST  2 VIEW

[chest pa]
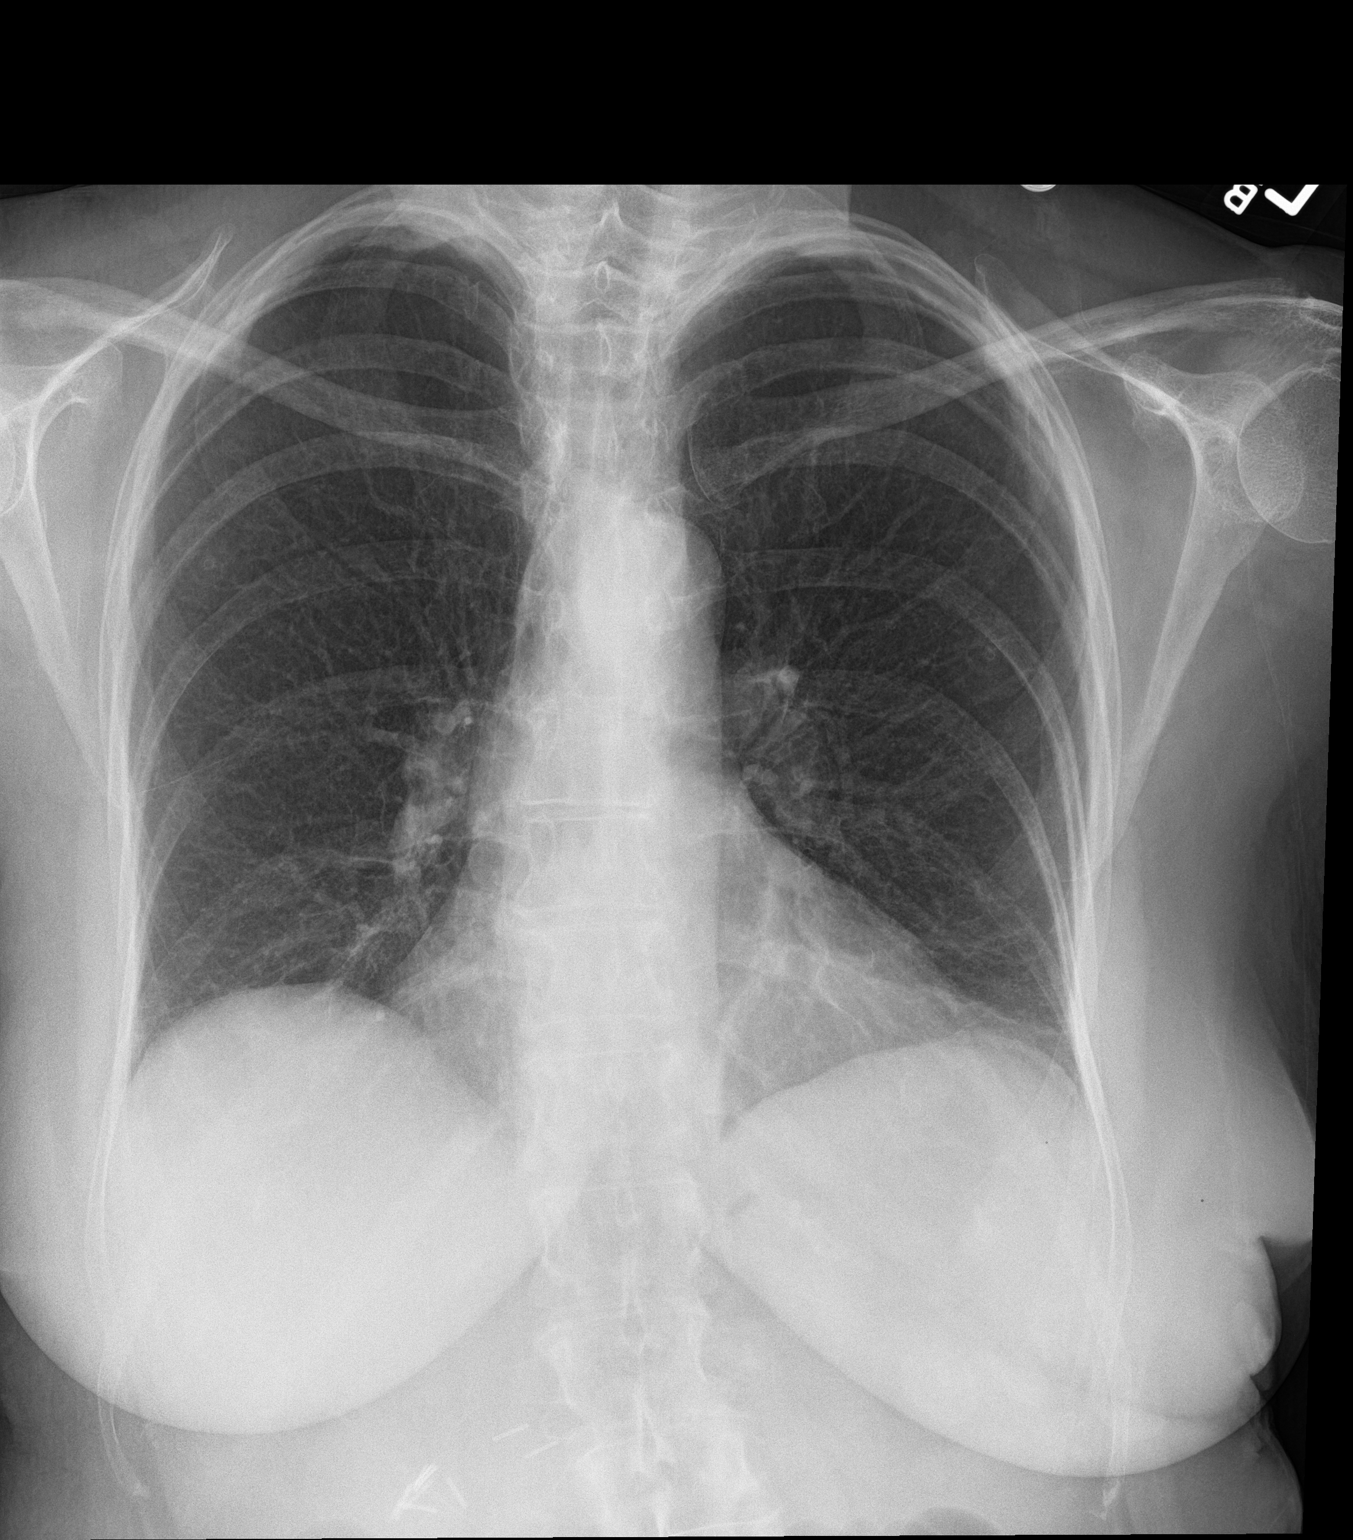

[chest lat]
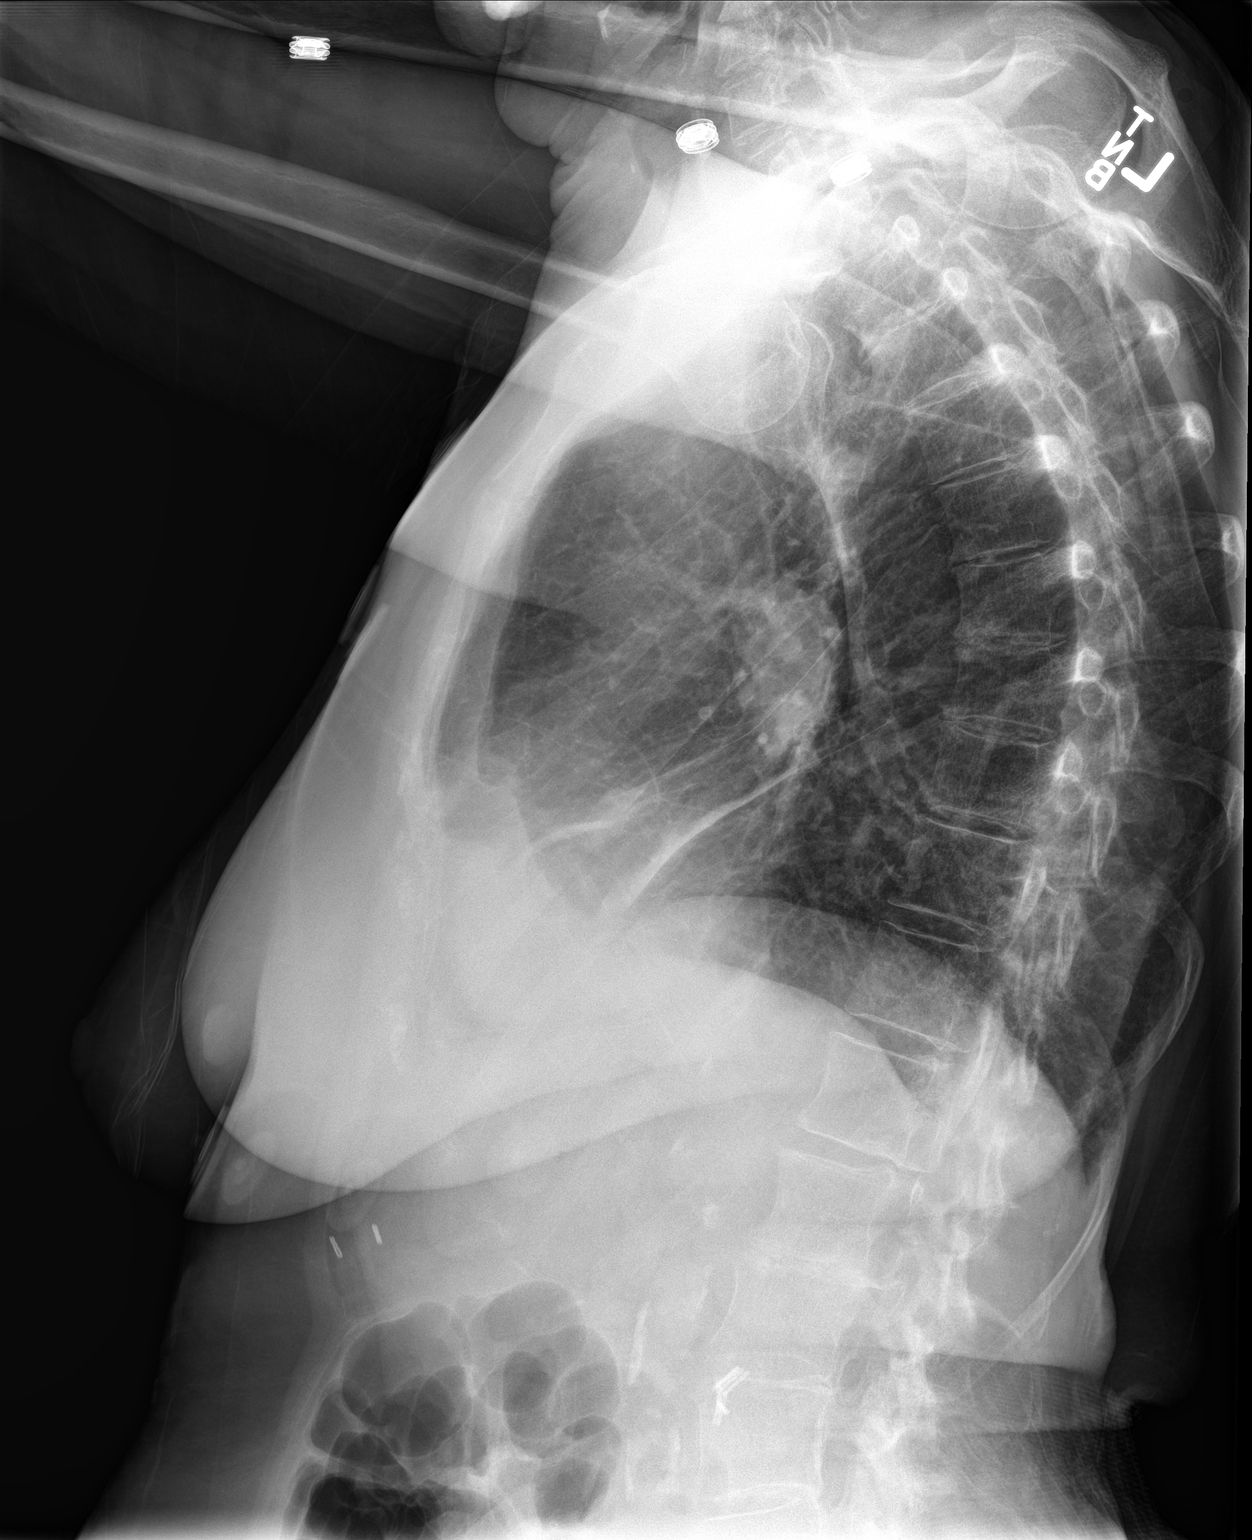

[2 of 2 positions shown; findings below may reference images not displayed]

FINDINGS: Minimal enlargement of cardiac silhouette.

Mediastinal contours and pulmonary vascularity normal.

Bronchitic changes and hyperinflation.

Bibasilar atelectasis.

No definite acute infiltrate, pleural effusion or pneumothorax.

Bones demineralized.
IMPRESSION: Bronchitic changes and hyperinflation with bibasilar atelectasis.

## 2017-03-06 IMAGING — MR MR HEAD W/O CM
9 of 10 series · 37 of 48 positions shown · non-contrast
Comparison: MRI head 10/13/2014

CLINICAL DATA: Dizziness since yesterday

EXAM:
MRI HEAD WITHOUT CONTRAST
TECHNIQUE: Multiplanar, multiecho pulse sequences of the brain and surrounding
structures were obtained without intravenous contrast.

[Series 3: DWI · axial · 1.6mm · 0.75mm/px · z∈[-90,+92]mm · 9 of 115 slices shown (1 of 4)]
[im 1/115]
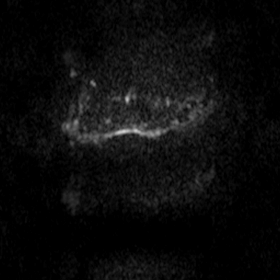
[im 21/115]
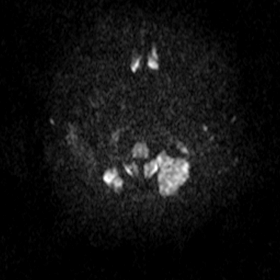
[im 32/115]
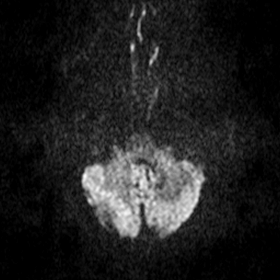
[im 52/115]
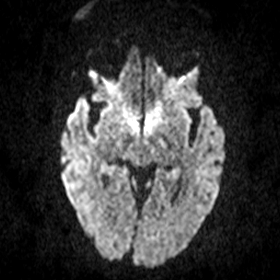
[im 63/115]
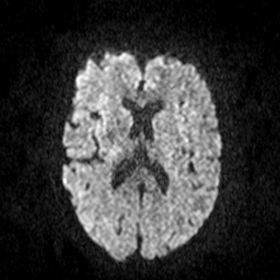
[im 83/115]
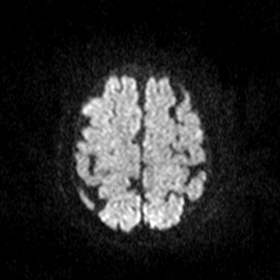
[im 94/115]
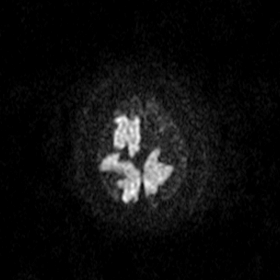
[im 104/115]
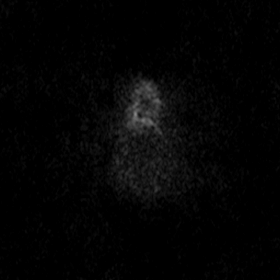
[im 115/115]
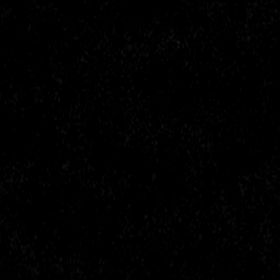

[Series 4: DWI · axial · 1.6mm · 0.78mm/px · z∈[-90,+77]mm · 11 of 106 slices shown (2 of 4)]
[im 1/106]
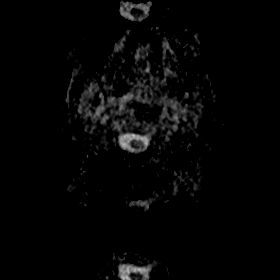
[im 11/106]
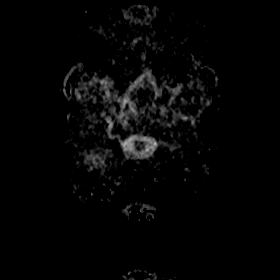
[im 22/106]
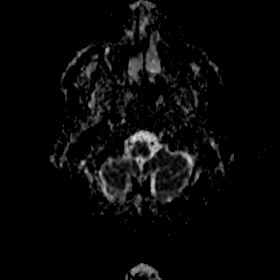
[im 32/106]
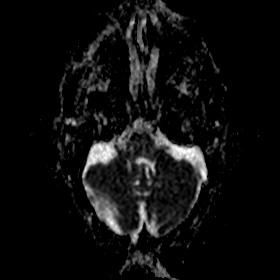
[im 43/106]
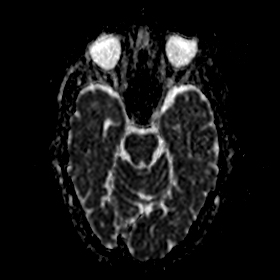
[im 53/106]
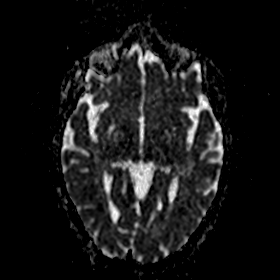
[im 64/106]
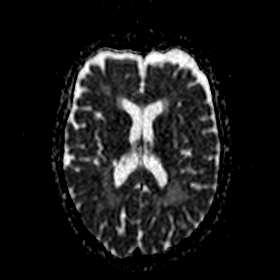
[im 74/106]
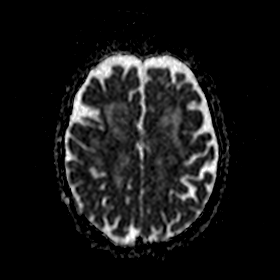
[im 85/106]
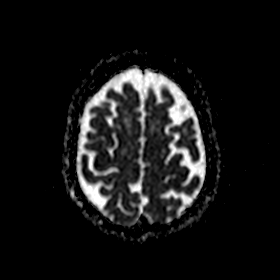
[im 95/106]
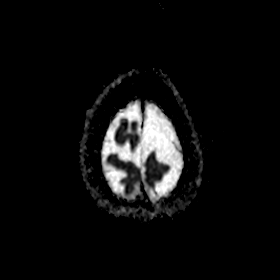
[im 106/106]
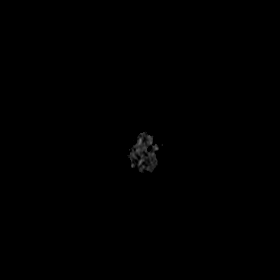

[Series 5: T1 · sagittal · 5.0mm · 0.41mm/px · 2 of 21 slices shown (1 of 2)]
[im 1/21]
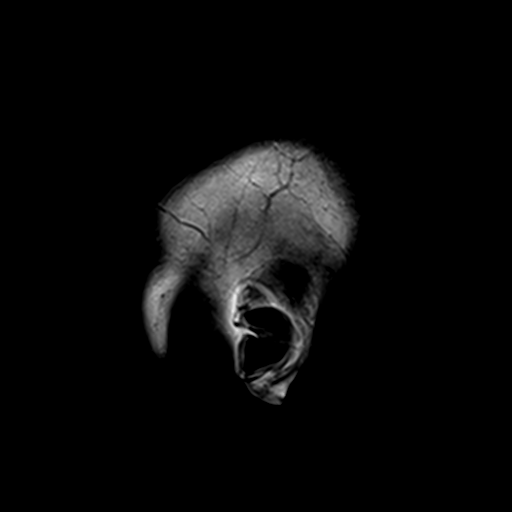
[im 21/21]
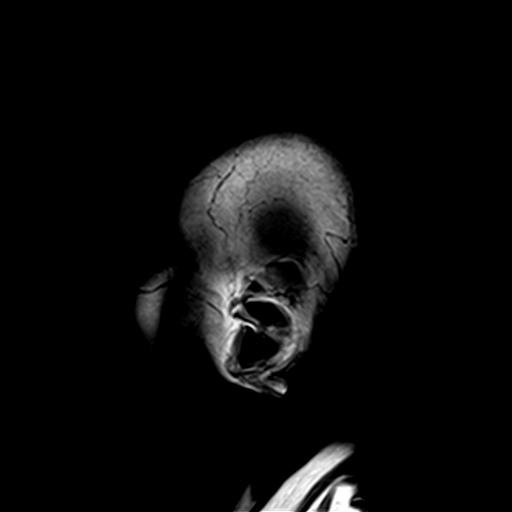

[Series 6: T2 · axial · 5.0mm · 0.45mm/px · z∈[-68,+75]mm · 2 of 23 slices shown (1 of 2)]
[im 1/23]
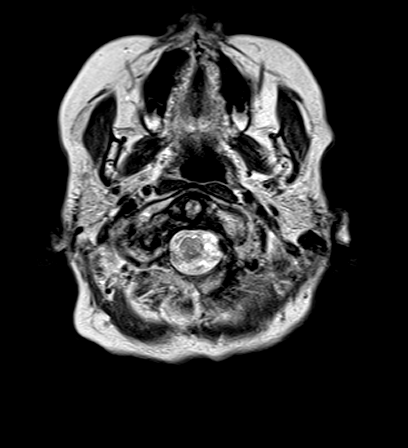
[im 23/23]
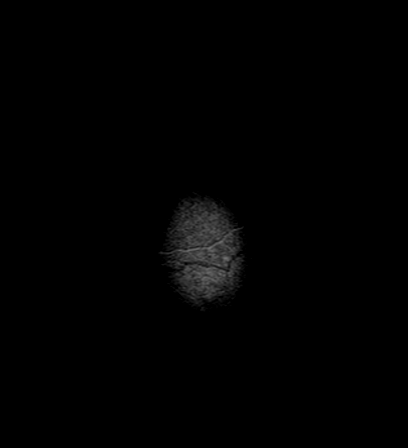

[Series 7: FLAIR · axial · 5.0mm · 0.33mm/px · z∈[-69,+74]mm · 2 of 23 slices shown]
[im 1/23]
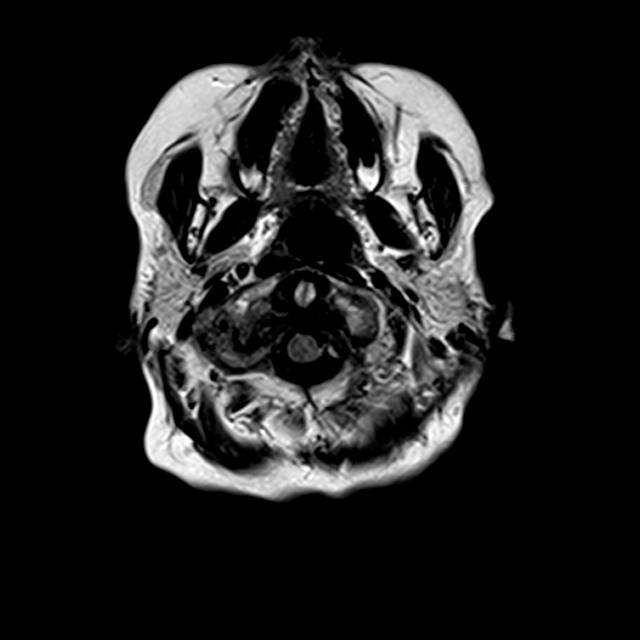
[im 23/23]
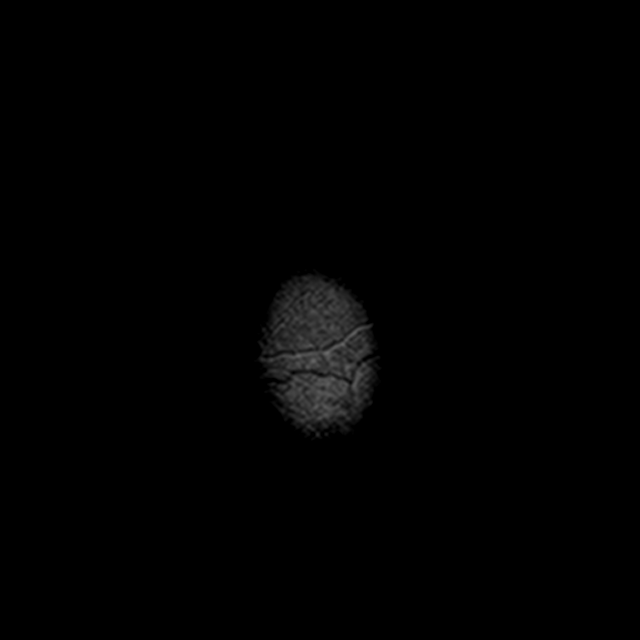

[Series 8: T1 · axial · 2.0mm · 0.41mm/px · z∈[-80,-58]mm · 2 of 79 slices shown (2 of 2)]
[im 1/79]
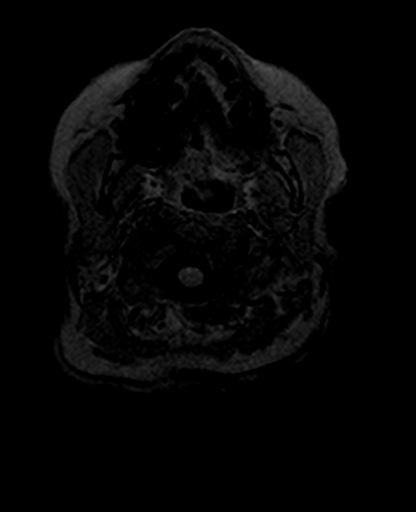
[im 12/79]
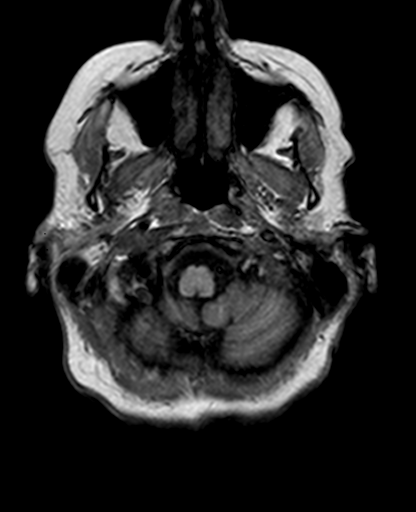

[Series 10: T2 · coronal · 5.0mm · 0.40mm/px · 3 of 28 slices shown (2 of 2)]
[im 1/28]
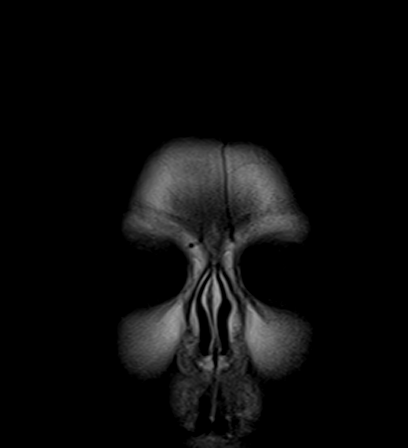
[im 14/28]
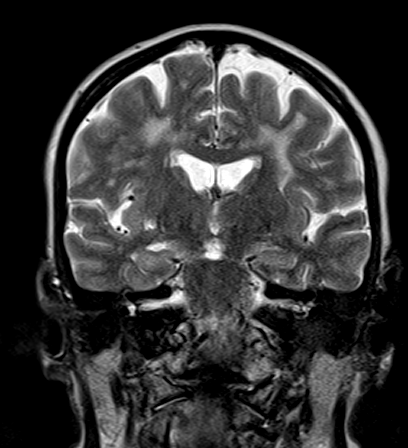
[im 28/28]
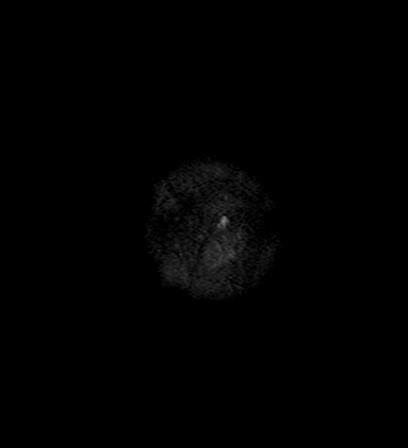

[Series 11: DWI · coronal · 5.0mm · 0.56mm/px · 3 of 34 slices shown (3 of 4)]
[im 1/34]
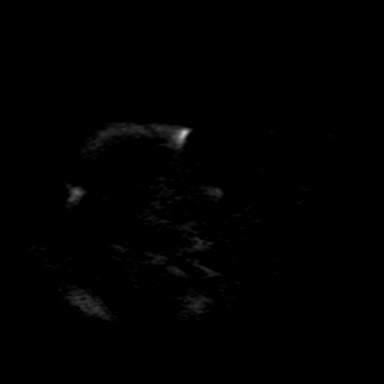
[im 17/34]
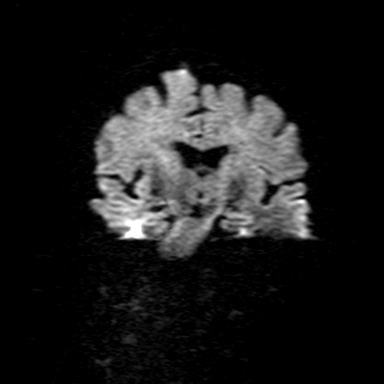
[im 34/34]
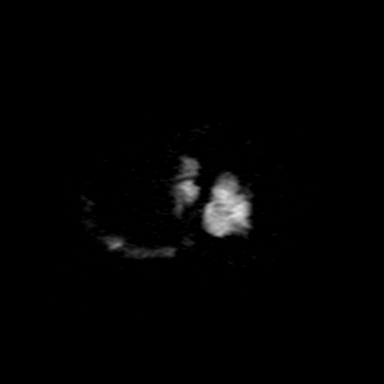

[Series 12: DWI · coronal · 5.0mm · 0.54mm/px · 3 of 34 slices shown (4 of 4)]
[im 1/34]
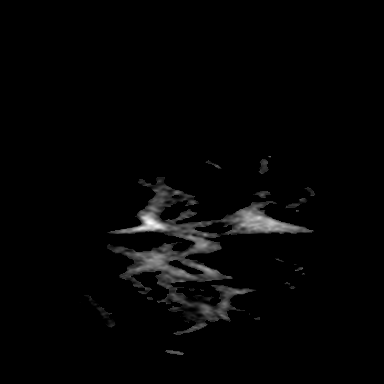
[im 17/34]
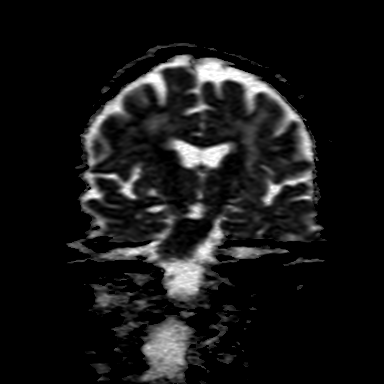
[im 34/34]
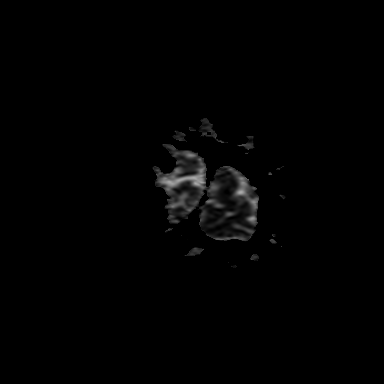

[37 of 48 positions shown; findings below may reference images not displayed]

FINDINGS: Brain: Mild atrophy. Bifrontal extra axial CSF fluid collections
unchanged from the prior MRI. Negative for hydrocephalus.

Negative for acute infarct. Moderate chronic microvascular ischemic
changes have progressed since 1601 throughout the cerebral white
matter. Chronic infarct right posterior limb external capsule and
right thalamus unchanged. Brainstem and cerebellum intact.

Vascular: Normal flow voids.

Skull and upper cervical spine: Negative

Sinuses/Orbits: Bilateral lens replacement. No orbital mass.
Paranasal sinuses clear.

Other: Normal soft tissues
IMPRESSION: Progressive chronic microvascular ischemic changes in the white
matter. No acute infarct.

## 2017-03-13 DIAGNOSIS — I2510 Atherosclerotic heart disease of native coronary artery without angina pectoris: Secondary | ICD-10-CM | POA: Diagnosis not present

## 2017-03-13 DIAGNOSIS — E785 Hyperlipidemia, unspecified: Secondary | ICD-10-CM | POA: Diagnosis not present

## 2017-03-13 DIAGNOSIS — I10 Essential (primary) hypertension: Secondary | ICD-10-CM | POA: Diagnosis not present

## 2017-03-28 ENCOUNTER — Other Ambulatory Visit (HOSPITAL_COMMUNITY): Payer: Self-pay | Admitting: Internal Medicine

## 2017-03-28 DIAGNOSIS — Z1231 Encounter for screening mammogram for malignant neoplasm of breast: Secondary | ICD-10-CM

## 2017-04-11 ENCOUNTER — Ambulatory Visit (HOSPITAL_COMMUNITY)
Admission: RE | Admit: 2017-04-11 | Discharge: 2017-04-11 | Disposition: A | Discharge status: Home / Self Care | Payer: Medicare Other | Source: Ambulatory Visit | Attending: Internal Medicine | Admitting: Internal Medicine

## 2017-04-11 DIAGNOSIS — Z1231 Encounter for screening mammogram for malignant neoplasm of breast: Secondary | ICD-10-CM | POA: Diagnosis present

## 2017-07-10 DIAGNOSIS — K219 Gastro-esophageal reflux disease without esophagitis: Secondary | ICD-10-CM | POA: Diagnosis not present

## 2017-07-10 DIAGNOSIS — E039 Hypothyroidism, unspecified: Secondary | ICD-10-CM | POA: Diagnosis not present

## 2017-07-10 DIAGNOSIS — Z79899 Other long term (current) drug therapy: Secondary | ICD-10-CM | POA: Diagnosis not present

## 2017-07-10 DIAGNOSIS — I639 Cerebral infarction, unspecified: Secondary | ICD-10-CM | POA: Diagnosis not present

## 2017-07-10 DIAGNOSIS — E785 Hyperlipidemia, unspecified: Secondary | ICD-10-CM | POA: Diagnosis not present

## 2017-07-10 DIAGNOSIS — I10 Essential (primary) hypertension: Secondary | ICD-10-CM | POA: Diagnosis not present

## 2017-07-10 DIAGNOSIS — I2510 Atherosclerotic heart disease of native coronary artery without angina pectoris: Secondary | ICD-10-CM | POA: Diagnosis not present

## 2017-07-17 DIAGNOSIS — E785 Hyperlipidemia, unspecified: Secondary | ICD-10-CM | POA: Diagnosis not present

## 2017-07-17 DIAGNOSIS — I10 Essential (primary) hypertension: Secondary | ICD-10-CM | POA: Diagnosis not present

## 2017-07-17 DIAGNOSIS — I2510 Atherosclerotic heart disease of native coronary artery without angina pectoris: Secondary | ICD-10-CM | POA: Diagnosis not present

## 2017-07-17 DIAGNOSIS — Z6824 Body mass index (BMI) 24.0-24.9, adult: Secondary | ICD-10-CM | POA: Diagnosis not present

## 2017-08-28 DIAGNOSIS — I10 Essential (primary) hypertension: Secondary | ICD-10-CM | POA: Diagnosis not present

## 2017-08-28 DIAGNOSIS — Z23 Encounter for immunization: Secondary | ICD-10-CM | POA: Diagnosis not present

## 2017-08-28 DIAGNOSIS — R05 Cough: Secondary | ICD-10-CM | POA: Diagnosis not present

## 2017-10-08 ENCOUNTER — Ambulatory Visit: Payer: Medicare Other | Admitting: Orthopedic Surgery

## 2017-10-09 ENCOUNTER — Ambulatory Visit (INDEPENDENT_AMBULATORY_CARE_PROVIDER_SITE_OTHER): Payer: Medicare Other

## 2017-10-09 ENCOUNTER — Ambulatory Visit (INDEPENDENT_AMBULATORY_CARE_PROVIDER_SITE_OTHER): Payer: Medicare Other | Admitting: Orthopedic Surgery

## 2017-10-09 ENCOUNTER — Encounter: Payer: Self-pay | Admitting: Orthopedic Surgery

## 2017-10-09 VITALS — BP 157/66 | HR 86 | Ht 61.0 in | Wt 120.0 lb

## 2017-10-09 DIAGNOSIS — M4306 Spondylolysis, lumbar region: Secondary | ICD-10-CM | POA: Diagnosis not present

## 2017-10-09 DIAGNOSIS — M545 Low back pain, unspecified: Secondary | ICD-10-CM

## 2017-10-09 DIAGNOSIS — I6521 Occlusion and stenosis of right carotid artery: Secondary | ICD-10-CM

## 2017-10-09 DIAGNOSIS — M4317 Spondylolisthesis, lumbosacral region: Secondary | ICD-10-CM | POA: Diagnosis not present

## 2017-10-09 DIAGNOSIS — M4150 Other secondary scoliosis, site unspecified: Secondary | ICD-10-CM

## 2017-10-09 MED ORDER — DICLOFENAC POTASSIUM 50 MG PO TABS: 50.0000 mg | ORAL_TABLET | Freq: Two times a day (BID) | ORAL | 3 refills | Status: DC

## 2017-10-09 NOTE — Patient Instructions (Addendum)
Spondylolisthesis Spondylolisthesis is when one of the bones in the spine (vertebrae) slips forward and out of place. This most commonly occurs in the lower back (lumbar spine), but it can happen anywhere along the spine. A vertebra may move out of place due to a previous back injury. In some cases, the injury may go unnoticed until the vertebra moves out of place later in life. Spondylolisthesis may also be caused by an injury or a condition that affects the bones. What are the causes? This condition may be caused by:  Injury (trauma). This is often a result of doing sports or physical activities that: ? Put a lot of strain on the bones in the lower back. ? Involve repetitive overstretching (hyperextension) of the spine.  A condition that affects the bones, such as osteoarthritis or cancer.  Changes in the spine that happen due to age-related wear and tear.  What increases the risk? The following factors may make you more likely to develop this condition:  Participating in sports or activities that put a lot of strain on the lower back, including: ? Gymnastics. ? Figure skating. ? Weight lifting. ? Football.  Having a condition that affects the bones.  Being older than age 11.  What are the signs or symptoms? Symptoms of this condition may include:  Mild to severe pain in the legs, lower back, or buttocks.  An abnormal way of walking (abnormal gait).  Poor posture.  Muscle stiffness, specifically in the hamstrings. The hamstrings are in the backs of the thighs.  Weakness, numbness, or a tingling sensation in the legs.  Symptoms may get worse when standing, and they may temporarily get better when sitting down or bending forward. In some cases, there may be no symptoms of this condition. How is this diagnosed?  This condition may be diagnosed based on:  Your symptoms.  Your medical history.  A physical exam. ? Your health care provider may push on certain areas to  determine the source of your pain. ? You may be asked to bend forward, backward, and side to side so your health care provider can assess the severity of your pain and your range of motion.  Imaging tests, such as: ? X-rays. ? CT scan. ? MRI.  How is this treated? Treatment for this condition may include:  Resting. This may involve avoiding or modifying activities that put strain on your back until your symptoms improve.  Medicines to help relieve pain.  NSAIDs to help reduce swelling and discomfort.  Injections of medicine (cortisone) in your back. These injections can to help relieve pain and numbness.  A brace to stabilize and support your back.  Physical therapy. This may include working with an occupational therapist or physical therapist who can teach you how to reduce pressure on your back while you do everyday activities.  Surgery. This may be needed if: ? Other treatment methods do not improve your condition. ? Your symptoms do not go away after 3-6 months. ? You are unable to walk or stand. ? You have severe pain.  Follow these instructions at home: If you have a brace:  Wear it as told by your health care provider. Remove it only as told by your health care provider.  Do not let your brace get wet if it is not waterproof.  Keep the brace clean. Driving  Do not drive or operate heavy machinery until you know how your pain medicine affects you.  Ask your health care provider when it  is safe to drive if you have a back brace. Activity  Rest and return to your normal activities as told by your health care provider. Ask your health care provider what activities are safe for you.  Avoid activities that take a lot of effort (are strenuous) for as long as told by your health care provider.  Do exercises as told by your health care provider. General instructions  Take over-the-counter and prescription medicines only as told by your health care provider.  If you  have questions or concerns about safety while taking pain medicine, talk with your health care provider.  Do not use any tobacco products, such as cigarettes, chewing tobacco, and e-cigarettes. Tobacco can delay bone healing. If you need help quitting, ask your health care provider.  Keep all follow-up visits as told by your health care provider. This is important. How is this prevented?  Warm up and stretch before being active.  Cool down and stretch after being active.  Give your body time to rest between periods of activity.  Make sure to use equipment that fits you.  Be safe and responsible while being active to avoid falls.  Do at least 150 minutes of moderate-intensity exercise each week, such as brisk walking or water aerobics.  Maintain physical fitness, including: ? Strength. ? Flexibility. ? Cardiovascular fitness. ? Endurance. Contact a health care provider if:  You have pain that gets worse or does not get better. Get help right away if:  You have severe back pain.  You develop weakness or numbness in your legs.  You are unable to stand or walk. This information is not intended to replace advice given to you by your health care provider. Make sure you discuss any questions you have with your health care provider. Document Released: 10/30/2005 Document Revised: 07/06/2016 Document Reviewed: 08/10/2015 Elsevier Interactive Patient Education  2018 [employer].  Spondylolysis Spondylolysis is a small break or crack (stress fracture) in a bone in the spine (vertebra) in the lower back (lumbar spine). The stress fracture occurs on the bony mass between and behind the vertebra. Spondylolysis may be caused by an injury (trauma) or by overuse. Since the lower back is almost always under pressure from daily living, this stress fracture usually does not heal normally. Spondylolysis may eventually cause one vertebra to slip forward and out of place (spondylolisthesis). What  are the causes? This condition may be caused by:  Trauma, such as a fall.  Excessive wear and tear. This is often a result of doing sports or physical activities that involve repetitive overstretching (hyperextension) and rotation of the spine.  What increases the risk? You may have a greater risk for spondylolysis if you participate in:  Gymnastics.  Weight lifting.  Rowing.  Diving.  Wrestling.  Tennis.  Soccer.  What are the signs or symptoms? Symptoms of this condition may include:  Long-lasting (chronic) pain in the lower back.  Stiffness in the back or the legs.  Tightness in the hamstring muscles, which are in the backs of the thighs.  In some cases, there may be no symptoms of this condition. How is this diagnosed?  This condition may be diagnosed based on:  Your symptoms.  Your medical history.  A physical exam. ? Your health care provider may push on certain areas to determine the source of your pain. ? You may be asked to bend forward, backward, and side to side so your health care provider can assess the severity of your pain  and your range of motion.  Imaging tests, such as: ? X-rays. ? CT scan. ? MRI.  How is this treated? Treatment for this condition may include:  Resting. This may involve avoiding or modifying activities that put strain on your back until your symptoms improve.  Medicines to help relieve pain.  NSAIDs to help reduce swelling and discomfort.  Injections of medicine (cortisone) in your back. These injections can to help relieve pain and numbness.  A brace to stabilize and support your back.  Physical therapy. This may include working with an occupational therapist or physical therapist who can teach you how to reduce pressure on your back while you do everyday activities.  Surgery. This may be needed if you have: ? A severe injury. ? Pain that lasts for more than 6 months.  Follow these instructions at home: If you  have a brace:  Wear it as told by your health care provider. Remove it only as told by your health care provider.  Do not let your brace get wet if it is not waterproof.  Keep the brace clean. Driving  Do not drive or operate heavy machinery until you know how your pain medicine affects you.  Ask your health care provider when it is safe to drive if you have a back brace. Activity  Rest and return to your normal activities as told by your health care provider. Ask your health care provider what activities are safe for you.  Avoid activities that take a lot of effort (are strenuous) for as long as told by your health care provider.  Do exercises as told by your health care provider. General instructions  Take over-the-counter and prescription medicines only as told by your health care provider.  If you have questions or concerns about safety while taking pain medicine, talk with your health care provider.  Do not use any tobacco products, such as cigarettes, chewing tobacco, and e-cigarettes. Tobacco can delay bone healing. If you need help quitting, ask your health care provider.  Keep all follow-up visits as told by your health care provider. This is important. How is this prevented?  Warm up and stretch before being active.  Cool down and stretch after being active.  Give your body time to rest between periods of activity.  Make sure to use equipment that fits you.  Be safe and responsible while being active to avoid falls.  Do at least 150 minutes of moderate-intensity exercise each week, such as brisk walking or water aerobics.  Maintain physical fitness, including: ? Strength. ? Flexibility. ? Cardiovascular fitness. ? Endurance. Contact a health care provider if:  You have pain that gets worse or does not get better. Get help right away if:  You have severe back pain.  You develop weakness or numbness in your legs.  You are unable to stand or walk. This  information is not intended to replace advice given to you by your health care provider. Make sure you discuss any questions you have with your health care provider. Document Released: 10/30/2005 Document Revised: 07/06/2016 Document Reviewed: 08/10/2015 Elsevier Interactive Patient Education  2018 [employer].  Scoliosis Scoliosis is the name given to a spine that curves sideways.Scoliosis can cause twisting of your shoulders, hips, chest, back, and rib cage. What are the causes? The cause of scoliosis is not always known. It may be caused by a birth defect or by a disease that can cause muscular dysfunction and imbalance, such as cerebral palsy and muscular  dystrophy. What increases the risk? Having a disease that causes muscle disease or dysfunction. What are the signs or symptoms? Scoliosis often has no signs or symptoms.If they are present, they may include:  Unequal size of one body side compared to the other (asymmetry).  Visible curvature of the spine.  Pain. The pain may limit physical activity.  Shortness of breath.  Bowel or bladder issues.  How is this diagnosed? A skilled health care provider will perform an evaluation. This will involve:  Taking your history.  Performing a physical examination.  Performing a neurological exam to detect nerve or muscle function loss.  Range of motion studies on the spine.  X-rays.  An MRI may also be obtained. How is this treated? Treatment varies depending on the nature, extent, and severity of the disease. If the curvature is not great, you may need only observation. A brace may be used to prevent scoliosis from progressing. A brace may also be needed during growth spurts. Physical therapy may be of benefit. Surgery may be required. Follow these instructions at home:  Your health care provider may suggest exercises to strengthen your muscles. Perform them as directed.  Ask your health care provider before participating in  any sports.  If you have been prescribed an orthopedic brace, wear it as instructed by your health care provider. Contact a health care provider if: Your brace causes the skin to become sore (chafe) or is uncomfortable. Get help right away if:  You have back pain that is not relieved by the medicines prescribed by your health care provider.  Your legs feel weak or you lose function in your legs.  You lose some bowel or bladder control. This information is not intended to replace advice given to you by your health care provider. Make sure you discuss any questions you have with your health care provider. Document Released: 10/27/2000 Document Revised: 04/06/2016 Document Reviewed: 05/04/2016 Elsevier Interactive Patient Education  [employer].

## 2017-10-09 NOTE — Progress Notes (Signed)
Progress Note   Patient ID: Sheila Briggs, female   DOB: 11-May-1932, 83 y.o.   MRN: [id number]  Chief Complaint  Patient presents with  . Back Pain    lumbar x 3 weeks no injury     83 year old female presents with 88-month history of lower back pain no trauma, history of lower back pain treated with epidural injections many years ago.  She says her pain comes intermittently it is worse with bending over relieved by extension.  She denies any numbness or tingling she not have any bowel or bladder dysfunction ( pain is intermittent , sharp, non radiating )  She was prescribed a muscle relaxer tizanidine but never got it filled went when she read the side effects    Review of Systems  Constitutional: Negative for chills and fever.  Gastrointestinal: Negative.   Genitourinary: Negative.   Neurological: Negative for tingling and sensory change.   No outpatient medications have been marked as taking for the 10/09/17 encounter (Appointment) with Carole Civil, MD.    Past Medical History:  Diagnosis Date  . Anxiety   . Arthritis   . Cancer (HCC)    basal cell of scalp  . GERD (gastroesophageal reflux disease)   . HOH (hard of hearing)   . Hypercholesteremia   . Hypertension   . Hypothyroidism   . Stroke ([place]) 2015   mild  . Thyroid disease      No Known Allergies  BP (!) 157/66   Pulse 86   Ht 5\' 1"  (1.549 m)   Wt 120 lb (54.4 kg)   BMI 22.67 kg/m    Physical Exam  Constitutional: She is oriented to person, place, and time. She appears well-developed and well-nourished.  Neurological: She is alert and oriented to person, place, and time. She has normal strength. She displays no atrophy and no tremor. No sensory deficit. She exhibits normal muscle tone. She displays no seizure activity. Coordination and gait normal.  Psychiatric: She has a normal mood and affect. Judgment normal.  Vitals reviewed.   Back Exam   Tenderness  The patient is experiencing  tenderness in the lumbar (Midline and left of the midline).  Range of Motion  Extension: abnormal  Flexion: abnormal  Lateral bend right: abnormal  Lateral bend left: abnormal  Rotation right: abnormal  Rotation left: abnormal   Muscle Strength  The patient has normal back strength. Right Quadriceps:  5/5  Left Quadriceps:  5/5   Tests  Straight leg raise right: negative Straight leg raise left: negative  Reflexes  Patellar: normal Achilles: normal Babinski's sign: normal   Other  Toe walk: normal Heel walk: normal Sensation: normal Gait: normal  Erythema: no back redness Scars: absent     Right and left LE's normal strength stability and alignment with no tenderness in the hip area/hip joint.  Medical decision-making  Imaging: See separate dictated report  Summary spondylolysis and spondylolisthesis L5 on S1 with degenerative scoliosis lumbar   Encounter Diagnoses  Name Primary?  . Acute low back pain without sciatica, unspecified back pain laterality Yes  . Spondylolysis, lumbar region   . Spondylolisthesis at L5-S1 level   . Degenerative scoliosis in adult patient     Meds ordered this encounter  Medications  . diclofenac (CATAFLAM) 50 MG tablet    Sig: Take 1 tablet (50 mg total) by mouth 2 (two) times daily.    Dispense:  90 tablet    Refill:  3   Start PT  6 weeks fu    Arther Abbott, MD 10/09/2017 2:26 PM

## 2017-10-19 ENCOUNTER — Encounter (HOSPITAL_COMMUNITY): Payer: Self-pay

## 2017-10-19 ENCOUNTER — Other Ambulatory Visit: Payer: Self-pay

## 2017-10-19 ENCOUNTER — Ambulatory Visit (HOSPITAL_COMMUNITY): Payer: Medicare Other | Attending: Orthopedic Surgery

## 2017-10-19 DIAGNOSIS — R6889 Other general symptoms and signs: Secondary | ICD-10-CM | POA: Insufficient documentation

## 2017-10-19 DIAGNOSIS — G8929 Other chronic pain: Secondary | ICD-10-CM

## 2017-10-19 DIAGNOSIS — R198 Other specified symptoms and signs involving the digestive system and abdomen: Secondary | ICD-10-CM | POA: Diagnosis not present

## 2017-10-19 DIAGNOSIS — M5386 Other specified dorsopathies, lumbar region: Secondary | ICD-10-CM

## 2017-10-19 DIAGNOSIS — M545 Low back pain: Secondary | ICD-10-CM | POA: Insufficient documentation

## 2017-10-19 DIAGNOSIS — R29898 Other symptoms and signs involving the musculoskeletal system: Secondary | ICD-10-CM | POA: Insufficient documentation

## 2017-10-19 NOTE — Patient Instructions (Signed)
  Lower Abdominals Strengthening - Take a breath in. As you breathe out, gently activate your abdominal muscles by pulling your belly button in towards your spine. - Try to do this without allowing your ribs to rise upwards - Once you are holding the contraction, try to breathe in and out slowly without "losing" the Contraction Repeat 15 Times Hold 3 Seconds Complete 1 Set Perform 1 Time(s) a Day   HEEL SLIDES - SUPINE Lying on your back with knees bent, perform abdominal contraction slide the affected heel towards your buttock as you bend your knee. Hold a gentle stretch in this position and then return to original position. Repeat 15 Times Hold 1 Second Complete 1 Set Perform 1 Time(s) a Day   SEATED HAMSTRING STRETCH While seated, rest your heel on the floor with your knee straight and gently lean forward until a stretch is felt behind your knee/thigh. Repeat 3 Times Hold 30 Seconds Complete 1 Set Perform 1 Time(s) a Day

## 2017-10-19 NOTE — Therapy (Signed)
[place], [place], [address] Phone: [phone]   Fax:  [phone]  Physical Therapy Evaluation  Patient Details  Name: Sheila Briggs MRN: [id number] Date of Birth: 1932/01/15 Referring Provider: Carole Civil MD   Encounter Date: 10/19/2017  PT End of Session - 10/19/17 1514    Visit Number  1    Number of Visits  17    Date for PT Re-Evaluation  11/19/17    Authorization Type  Medicare    Authorization Time Period  10/19/2017 - 12/14/2017    Authorization - Visit Number  1    Authorization - Number of Visits  10    PT Start Time  1300    PT Stop Time  1348    PT Time Calculation (min)  48 min    Activity Tolerance  Patient tolerated treatment well    Behavior During Therapy  [facility] for tasks assessed/performed       Past Medical History:  Diagnosis Date  . Anxiety   . Arthritis   . Cancer (HCC)    basal cell of scalp  . GERD (gastroesophageal reflux disease)   . HOH (hard of hearing)   . Hypercholesteremia   . Hypertension   . Hypothyroidism   . Stroke ([place]) 2015   mild  . Thyroid disease     Past Surgical History:  Procedure Laterality Date  . ABDOMINAL HYSTERECTOMY    . APPENDECTOMY    . BIOPSY THYROID     benign  . CATARACT EXTRACTION W/PHACO  12/19/2012   Procedure: CATARACT EXTRACTION PHACO AND INTRAOCULAR LENS PLACEMENT (IOC);  Surgeon: Tonny Branch, MD;  Location: [facility];  Service: Ophthalmology;  Laterality: Left;  CDE 13.75  . CATARACT EXTRACTION W/PHACO Right 01/06/2013   Procedure: CATARACT EXTRACTION PHACO AND INTRAOCULAR LENS PLACEMENT (IOC);  Surgeon: Tonny Branch, MD;  Location: [facility];  Service: Ophthalmology;  Laterality: Right;  CDE: 9.96  . CHOLECYSTECTOMY    . ENDARTERECTOMY Right 01/13/2016   Procedure: ENDARTERECTOMY CAROTID RIGHT;  Surgeon: Angelia Mould, MD;  Location: [place];  Service: Vascular;  Laterality: Right;  . PATCH ANGIOPLASTY  01/13/2016   Procedure: RIGHT  CAROTID ARTERY PATCH ANGIOPLASTY USING 1CM X 6CM XENOSURE BIOLOGIC PATCH;  Surgeon: Angelia Mould, MD;  Location: [place];  Service: Vascular;;  . TONSILLECTOMY      There were no vitals filed for this visit.   Subjective Assessment - 10/19/17 1309    Subjective  Patient states that she is having low back pain that is just below the waist more than on the right side.     Pertinent History  Spondylolysis L5-S1 spondylolisthesis grade 2 L5 on S1 disc space narrowing L4-L5     Limitations  Standing;Sitting    How long can you sit comfortably?  1 minute    How long can you stand comfortably?  10 minutes and then feels like she has to move     How long can you walk comfortably?  Not limited    Diagnostic tests  Spondylolysis L5-S1 spondylolisthesis grade 2 L5 on S1 disc space narrowing L4-L5     Patient Stated Goals  bending over to put shoes on; just keep up with regular activities and maintain independence     Currently in Pain?  Yes    Pain Score  4     Pain Location  Back    Pain Orientation  Left    Pain  Descriptors / Indicators  Aching    Pain Type  Chronic pain    Pain Onset  More than a month ago    Aggravating Factors   Bending down    Pain Relieving Factors  Pillows placed behind the back are helpful     Multiple Pain Sites  No         [facility] PT Assessment - 10/19/17 0001      Assessment   Medical Diagnosis  Acute low back pain without sciatica, unspecified back pain laterality; spondylolysis, lumbar region; spondylolisthesis at L5-S1 level; Degenerative scoliosis in adult patient    Referring Provider  Carole Civil MD    Onset Date/Surgical Date  10/19/17      Balance Screen   Has the patient fallen in the past 6 months  No Patient has not fallen, but says she is worried about it    Has the patient had a decrease in activity level because of a fear of falling?   No    Is the patient reluctant to leave their home because of a fear of falling?   Yes       Observation/Other Assessments   Focus on Therapeutic Outcomes (FOTO)   60% limited    Other Surveys   -- LEFS: 65      Sensation   Light Touch  Appears Intact      AROM   Overall AROM Comments  --    Lumbar Flexion  Severely limited and very painful    Lumbar Extension  Severely limited not as painful as flexion    Lumbar - Right Side Bend  Severely limited pain in left side of back    Lumbar - Left Side Bend  Severely limited pain in right side of back    Lumbar - Right Rotation  Severely limited    Lumbar - Left Rotation  Severely limited      Strength   Right/Left Hip  Right;Left    Right Hip Flexion  4/5    Right Hip ABduction  4+/5    Right Hip ADduction  4+/5    Left Hip Flexion  4/5    Left Hip ABduction  4+/5    Left Hip ADduction  4+/5    Right/Left Knee  Right;Left    Right Knee Flexion  4+/5    Right Knee Extension  4+/5    Left Knee Flexion  5/5    Left Knee Extension  4+/5    Lumbar Flexion  3+/5      Flexibility   Soft Tissue Assessment /Muscle Length  yes    Hamstrings  Severely limited      Palpation   Palpation comment  Pain with palpation at T10 and at left PSIS      Transfers   Comments  Patient performs transfers independently but requires increased time      Ambulation/Gait   Gait Comments  TUG: 11 seconds For age group with is within the normative range             Objective measurements completed on examination: See above findings.      [facility] Adult PT Treatment/Exercise - 10/19/17 0001      Lumbar Exercises: Stretches   Passive Hamstring Stretch  30 seconds;3 reps Bilateral    Passive Hamstring Stretch Limitations  8'' box      Lumbar Exercises: Supine   Ab Set  15 reps;3 seconds    AB Set Limitations  TA Contractions  supine with knees bent    Other Supine Lumbar Exercises  TA contraction with heel slide x15 each lower extremity             PT Education - 10/19/17 1536    Education provided  Yes    Education Details   Patient was educated on home exercise program, examination findings, and about plan of care    Person(s) Educated  Patient    Methods  Explanation;Handout    Comprehension  Verbalized understanding;Verbal cues required;Tactile cues required       PT Short Term Goals - 10/19/17 1555      PT SHORT TERM GOAL #1   Title  Patient will report understanding and report regular compliance with HEP.     Baseline  Patient educated on HEP.     Time  4    Period  Weeks    Status  New    Target Date  11/16/17      PT SHORT TERM GOAL #2   Title  Patient will demonstrate a 1/2 MMT grade increase in all lower extremity strength planes tested.     Baseline  Patient has weakness in lower extremity hips, knees, and in lumbar spine.     Time  4    Period  Weeks    Status  New    Target Date  11/16/17      PT SHORT TERM GOAL #3   Title  Patient will report a decrease in pain to 3/10 for 1 week in order to improve tolerance for functional activities.     Baseline  4/10 pain    Time  4    Period  Weeks    Status  New    Target Date  11/16/17        PT Long Term Goals - 10/19/17 1559      PT LONG TERM GOAL #1   Title  Patient will demonstrate 5/5 strength of lower extremities in all tested planes in order to increase tolerance for functional activities.     Baseline  Decreased in bilateral hip and knee strength    Time  8    Period  Weeks    Status  New    Target Date  12/14/17      PT LONG TERM GOAL #2   Title  Patient will report ability to sit for 20 minutes with maximum of 2/10 pain.     Baseline  Patient has 4/10 pain immediately with sitting.     Time  8    Period  Weeks    Status  New    Target Date  12/14/17      PT LONG TERM GOAL #3   Title  Patient's FOTO limitation score will decrease by 13% indicating an increased ease of performing functional activities.     Baseline  60% limited    Time  8    Period  Weeks    Status  New    Target Date  12/14/17              Plan - 10/19/17 1539    Clinical Impression Statement  Patient is a pleasant 83 year old woman referred to physical therapy today for acute low back pain without sciatica. According to the medical record, the results of imaging suggested that the patient has spondylolysis L5-S1 spondylolisthesis grade 2 L5 on S1 with disc space narrowing. Patient reports that she has had this pain for about 3  months and that when she bends down it is especially painful. Patient states she has more pain in the left side today than in her right side. Upon examination patient is limited in lumbar flexion, lumbar extension, lumbar sidebending, and lumbar rotation bilaterally. Patient reports pain increased sidebending to the left more. Patient also demonstrates weakness in lumbar flexion, bilateral hip flexion, bilateral hip abduction and adduction, bilateral knee extension, and with left knee extension. Patient had stated her medicines had changed and therapist copied down what patient had written, and scanned into medical record. Patient is painful to palpation at T10 and at left PSIS. Patient's TUG score was within the reference norms for her age group, however she reports that she is fearful of falling which limits her function. Patient would benefit from physical therapy in order to decrease pain, improve strength, range of motion, and improve functional independence.     History and Personal Factors relevant to plan of care:  spondylolysis, spondyloisthesis    Clinical Presentation  Stable    Clinical Presentation due to:  Positive factors: patient's positive attitude and motivation; Negative factors: chronicity of issue    Clinical Decision Making  Low    Rehab Potential  Good    Clinical Impairments Affecting Rehab Potential  No trauma; scoliosis; spondylolysis and listhesis    PT Frequency  2x / week    PT Duration  8 weeks    PT Treatment/Interventions  ADLs/Self Care Home Management;Moist Heat;Gait  training;Functional mobility training;Therapeutic activities;Therapeutic exercise;Balance training;Neuromuscular re-education;Patient/family education;Manual techniques;Passive range of motion;Dry needling    PT Next Visit Plan  Review evaluation; review HEP; progress with lower extremity strengthening, abdominal strengthening and ROM exercises    PT Home Exercise Plan  TA contractions x15 1x/day; TA contractions with heel slide x15 1x/day bilateral lower extremities; seated hamstring stretch 3x30''     Consulted and Agree with Plan of Care  Patient       Patient will benefit from skilled therapeutic intervention in order to improve the following deficits and impairments:  Decreased activity tolerance, Decreased endurance, Decreased range of motion, Decreased strength, Abnormal gait, Pain, Decreased balance, Decreased mobility, Difficulty walking, Impaired flexibility, Postural dysfunction  Visit Diagnosis: Chronic left-sided low back pain without sciatica  Abdominal weakness  Decreased range of motion of lumbar spine  Decreased strength of lower extremity  Decreased functional activity tolerance  G-Codes - 10-26-2017 1607    Functional Assessment Tool Used (Outpatient Only)  FOTO    Functional Limitation  Mobility: Walking and moving around    Mobility: Walking and Moving Around Current Status (H2992)  At least 60 percent but less than 80 percent impaired, limited or restricted    Mobility: Walking and Moving Around Goal Status (E2683)  At least 40 percent but less than 60 percent impaired, limited or restricted        Problem List Patient Active Problem List   Diagnosis Date Noted  . Carotid stenosis 01/13/2016  . CVA (cerebral infarction) 10/13/2014  . Arthritis of knee, degenerative 01/29/2014  . Hypothyroid 06/10/2012  . Hypertension   . 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 KNEE PAIN 03/15/2009  . IMPINGEMENT SYNDROME 03/11/2008  . SHOULDER PAIN 10/16/2007  . HIGH BLOOD  PRESSURE 10/16/2007    Clarene Critchley PT, DPT 4:20 PM, 2017-10-26 [place], [place], [address] Phone: [phone]   Fax:  [phone]  Name: Sheila Briggs MRN: [id number] Date of Birth: 01-10-1932

## 2017-10-24 ENCOUNTER — Ambulatory Visit (HOSPITAL_COMMUNITY): Payer: Medicare Other | Admitting: Physical Therapy

## 2017-10-24 DIAGNOSIS — R198 Other specified symptoms and signs involving the digestive system and abdomen: Secondary | ICD-10-CM | POA: Diagnosis not present

## 2017-10-24 DIAGNOSIS — G8929 Other chronic pain: Secondary | ICD-10-CM | POA: Diagnosis not present

## 2017-10-24 DIAGNOSIS — R29898 Other symptoms and signs involving the musculoskeletal system: Secondary | ICD-10-CM

## 2017-10-24 DIAGNOSIS — M545 Low back pain, unspecified: Secondary | ICD-10-CM

## 2017-10-24 DIAGNOSIS — R6889 Other general symptoms and signs: Secondary | ICD-10-CM | POA: Diagnosis not present

## 2017-10-24 DIAGNOSIS — M5386 Other specified dorsopathies, lumbar region: Secondary | ICD-10-CM | POA: Diagnosis not present

## 2017-10-24 NOTE — Therapy (Signed)
[place], [place], [address] Phone: [phone]   Fax:  [phone]  Physical Therapy Treatment  Patient Details  Name: Sheila Briggs MRN: [id number] Date of Birth: 12/29/1931 Referring Provider: Carole Civil MD   Encounter Date: 10/24/2017  PT End of Session - 10/24/17 1334    Visit Number  2    Number of Visits  17    Date for PT Re-Evaluation  11/19/17    Authorization Type  Medicare    Authorization Time Period  10/19/2017 - 12/14/2017    Authorization - Visit Number  2    Authorization - Number of Visits  10    PT Start Time  2094    PT Stop Time  1345    PT Time Calculation (min)  40 min    Activity Tolerance  Patient tolerated treatment well    Behavior During Therapy  [facility] for tasks assessed/performed       Past Medical History:  Diagnosis Date  . Anxiety   . Arthritis   . Cancer (HCC)    basal cell of scalp  . GERD (gastroesophageal reflux disease)   . HOH (hard of hearing)   . Hypercholesteremia   . Hypertension   . Hypothyroidism   . Stroke ([place]) 2015   mild  . Thyroid disease     Past Surgical History:  Procedure Laterality Date  . ABDOMINAL HYSTERECTOMY    . APPENDECTOMY    . BIOPSY THYROID     benign  . CATARACT EXTRACTION W/PHACO  12/19/2012   Procedure: CATARACT EXTRACTION PHACO AND INTRAOCULAR LENS PLACEMENT (IOC);  Surgeon: Tonny Branch, MD;  Location: [facility];  Service: Ophthalmology;  Laterality: Left;  CDE 13.75  . CATARACT EXTRACTION W/PHACO Right 01/06/2013   Procedure: CATARACT EXTRACTION PHACO AND INTRAOCULAR LENS PLACEMENT (IOC);  Surgeon: Tonny Branch, MD;  Location: [facility];  Service: Ophthalmology;  Laterality: Right;  CDE: 9.96  . CHOLECYSTECTOMY    . ENDARTERECTOMY Right 01/13/2016   Procedure: ENDARTERECTOMY CAROTID RIGHT;  Surgeon: Angelia Mould, MD;  Location: [facility];  Service: Vascular;  Laterality: Right;  . PATCH ANGIOPLASTY  01/13/2016   Procedure: RIGHT  CAROTID ARTERY PATCH ANGIOPLASTY USING 1CM X 6CM XENOSURE BIOLOGIC PATCH;  Surgeon: Angelia Mould, MD;  Location: [place];  Service: Vascular;;  . TONSILLECTOMY      There were no vitals filed for this visit.  Subjective Assessment - 10/24/17 1605    Subjective  Pt states she has no pain currently, only stiffness    Currently in Pain?  No/denies                      [facility] Adult PT Treatment/Exercise - 10/24/17 0001      Lumbar Exercises: Stretches   Passive Hamstring Stretch  30 seconds;3 reps    Passive Hamstring Stretch Limitations  12'' box    Single Knee to Chest Stretch  3 reps;20 seconds    Lower Trunk Rotation  10 seconds;5 reps    Piriformis Stretch  1 rep;60 seconds;Limitations    Piriformis Stretch Limitations  seated      Lumbar Exercises: Supine   Ab Set  15 reps;3 seconds    AB Set Limitations  TA Contractions supine with knees bent    Bridge  10 reps    Straight Leg Raise  10 reps      Lumbar Exercises: Sidelying   Hip Abduction  10 reps      Lumbar Exercises: Prone   Straight Leg Raise  10 reps             PT Education - 10/24/17 1604    Education provided  Yes    Education Details  reveiwed HEP and goals per initial evaluation.  Updated HEP with LE strengthening.    Person(s) Educated  Patient    Methods  Explanation;Demonstration;Tactile cues;Verbal cues;Handout    Comprehension  Verbalized understanding;Returned demonstration;Verbal cues required;Tactile cues required;Need further instruction       PT Short Term Goals - 10/19/17 1555      PT SHORT TERM GOAL #1   Title  Patient will report understanding and report regular compliance with HEP.     Baseline  Patient educated on HEP.     Time  4    Period  Weeks    Status  New    Target Date  11/16/17      PT SHORT TERM GOAL #2   Title  Patient will demonstrate a 1/2 MMT grade increase in all lower extremity strength planes tested.     Baseline  Patient has weakness in  lower extremity hips, knees, and in lumbar spine.     Time  4    Period  Weeks    Status  New    Target Date  11/16/17      PT SHORT TERM GOAL #3   Title  Patient will report a decrease in pain to 3/10 for 1 week in order to improve tolerance for functional activities.     Baseline  4/10 pain    Time  4    Period  Weeks    Status  New    Target Date  11/16/17        PT Long Term Goals - 10/19/17 1559      PT LONG TERM GOAL #1   Title  Patient will demonstrate 5/5 strength of lower extremities in all tested planes in order to increase tolerance for functional activities.     Baseline  Decreased in bilateral hip and knee strength    Time  8    Period  Weeks    Status  New    Target Date  12/14/17      PT LONG TERM GOAL #2   Title  Patient will report ability to sit for 20 minutes with maximum of 2/10 pain.     Baseline  Patient has 4/10 pain immediately with sitting.     Time  8    Period  Weeks    Status  New    Target Date  12/14/17      PT LONG TERM GOAL #3   Title  Patient's FOTO limitation score will decrease by 13% indicating an increased ease of performing functional activities.     Baseline  60% limited    Time  8    Period  Weeks    Status  New    Target Date  12/14/17            Plan - 10/24/17 1335    Clinical Impression Statement  Reviewed initial evaluation goals and HEP.  Pt required verbal and tactile cues to complete therex correctly and with stabilization.  PRogressed with LE strengthening this session and updated HEP to include addition of these. PT without c/o pain or issues at EOS.    Noted improvement in ambulation at end of treatment.    Rehab  Potential  Good    Clinical Impairments Affecting Rehab Potential  No trauma; scoliosis; spondylolysis and listhesis    PT Frequency  2x / week    PT Duration  8 weeks    PT Treatment/Interventions  ADLs/Self Care Home Management;Moist Heat;Gait training;Functional mobility training;Therapeutic  activities;Therapeutic exercise;Balance training;Neuromuscular re-education;Patient/family education;Manual techniques;Passive range of motion;Dry needling    PT Next Visit Plan  Continue with progression of lower extremity strengthening, abdominal strengthening and ROM exercises    PT Home Exercise Plan  TA contractions x15 1x/day; TA contractions with heel slide x15 1x/day bilateral lower extremities; seated hamstring stretch 3x30''     Consulted and Agree with Plan of Care  Patient       Patient will benefit from skilled therapeutic intervention in order to improve the following deficits and impairments:  Decreased activity tolerance, Decreased endurance, Decreased range of motion, Decreased strength, Abnormal gait, Pain, Decreased balance, Decreased mobility, Difficulty walking, Impaired flexibility, Postural dysfunction  Visit Diagnosis: Chronic left-sided low back pain without sciatica  Abdominal weakness  Decreased range of motion of lumbar spine  Decreased strength of lower extremity  Decreased functional activity tolerance     Problem List Patient Active Problem List   Diagnosis Date Noted  . Carotid stenosis 01/13/2016  . CVA (cerebral infarction) 10/13/2014  . Arthritis of knee, degenerative 01/29/2014  . Hypothyroid 06/10/2012  . Hypertension   . 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 KNEE PAIN 03/15/2009  . IMPINGEMENT SYNDROME 03/11/2008  . SHOULDER PAIN 10/16/2007  . HIGH BLOOD PRESSURE 10/16/2007    Roseanne Reno B 10/24/2017, 4:06 PM  [facility] [address] [place], [place], [address] Phone: [phone]   Fax:  [phone]  Name: Sheila Briggs MRN: [id number] Date of Birth: [birth year]

## 2017-10-24 NOTE — Patient Instructions (Signed)
Abduction: Side Leg Lift (Eccentric) - Side-Lying    Lie on side. Lift top leg slightly higher than shoulder level. Keep top leg straight with body, toes pointing forward. Slowly lower for 3-5 seconds. _10__ reps per set, _2__ sets per day   Straight Leg Raise    Tighten stomach and slowly raise locked right leg __10__ inches from floor.Repeat __10__ times per set. Do _2___ sets per day  [facility] small of back into mat, maintain pelvic tilt, roll up one vertebrae at a time. Focus on engaging posterior hip muscles.Hold for _3__seconds.  Repeat _10___ times.

## 2017-10-29 ENCOUNTER — Other Ambulatory Visit: Payer: Self-pay

## 2017-10-29 ENCOUNTER — Ambulatory Visit (HOSPITAL_COMMUNITY): Payer: Medicare Other

## 2017-10-29 ENCOUNTER — Encounter (HOSPITAL_COMMUNITY): Payer: Self-pay

## 2017-10-29 DIAGNOSIS — R6889 Other general symptoms and signs: Secondary | ICD-10-CM

## 2017-10-29 DIAGNOSIS — M545 Low back pain: Secondary | ICD-10-CM | POA: Diagnosis not present

## 2017-10-29 DIAGNOSIS — G8929 Other chronic pain: Secondary | ICD-10-CM

## 2017-10-29 DIAGNOSIS — M5386 Other specified dorsopathies, lumbar region: Secondary | ICD-10-CM | POA: Diagnosis not present

## 2017-10-29 DIAGNOSIS — R198 Other specified symptoms and signs involving the digestive system and abdomen: Secondary | ICD-10-CM

## 2017-10-29 DIAGNOSIS — R29898 Other symptoms and signs involving the musculoskeletal system: Secondary | ICD-10-CM

## 2017-10-29 NOTE — Therapy (Signed)
[place], [place], [address] Phone: [phone]   Fax:  [phone]  Physical Therapy Treatment  Patient Details  Name: Sheila Briggs MRN: [id number] Date of Birth: 11/29/1931 Referring Provider: Carole Civil MD   Encounter Date: 10/29/2017  PT End of Session - 10/29/17 1436    Visit Number  3    Number of Visits  17    Date for PT Re-Evaluation  11/19/17    Authorization Type  Medicare    Authorization Time Period  10/19/2017 - 12/14/2017    Authorization - Visit Number  3    Authorization - Number of Visits  10    PT Start Time  5638    PT Stop Time  1430    PT Time Calculation (min)  42 min    Activity Tolerance  Patient tolerated treatment well    Behavior During Therapy  [facility] for tasks assessed/performed       Past Medical History:  Diagnosis Date  . Anxiety   . Arthritis   . Cancer (HCC)    basal cell of scalp  . GERD (gastroesophageal reflux disease)   . HOH (hard of hearing)   . Hypercholesteremia   . Hypertension   . Hypothyroidism   . Stroke ([place]) 2015   mild  . Thyroid disease     Past Surgical History:  Procedure Laterality Date  . ABDOMINAL HYSTERECTOMY    . APPENDECTOMY    . BIOPSY THYROID     benign  . CATARACT EXTRACTION W/PHACO  12/19/2012   Procedure: CATARACT EXTRACTION PHACO AND INTRAOCULAR LENS PLACEMENT (IOC);  Surgeon: Tonny Branch, MD;  Location: [facility];  Service: Ophthalmology;  Laterality: Left;  CDE 13.75  . CATARACT EXTRACTION W/PHACO Right 01/06/2013   Procedure: CATARACT EXTRACTION PHACO AND INTRAOCULAR LENS PLACEMENT (IOC);  Surgeon: Tonny Branch, MD;  Location: [facility];  Service: Ophthalmology;  Laterality: Right;  CDE: 9.96  . CHOLECYSTECTOMY    . ENDARTERECTOMY Right 01/13/2016   Procedure: ENDARTERECTOMY CAROTID RIGHT;  Surgeon: Angelia Mould, MD;  Location: [place];  Service: Vascular;  Laterality: Right;  . PATCH ANGIOPLASTY  01/13/2016   Procedure: RIGHT  CAROTID ARTERY PATCH ANGIOPLASTY USING 1CM X 6CM XENOSURE BIOLOGIC PATCH;  Surgeon: Angelia Mould, MD;  Location: [place];  Service: Vascular;;  . TONSILLECTOMY      There were no vitals filed for this visit.  Subjective Assessment - 10/29/17 1354    Subjective  Patient states that her pain is a 2/10 this session mostly on the left side. With balance exercises patient states that she has some fear that she might fall.     Pertinent History  Spondylolysis L5-S1 spondylolisthesis grade 2 L5 on S1 disc space narrowing L4-L5     Limitations  Standing;Sitting    How long can you sit comfortably?  1 minute    How long can you stand comfortably?  10 minutes and then feels like she has to move     How long can you walk comfortably?  Not limited    Diagnostic tests  Spondylolysis L5-S1 spondylolisthesis grade 2 L5 on S1 disc space narrowing L4-L5     Patient Stated Goals  bending over to put shoes on; just keep up with regular activities and maintain independence     Currently in Pain?  Yes    Pain Score  2     Pain Location  Back    Pain  Orientation  Left    Pain Onset  More than a month ago                      [facility] Adult PT Treatment/Exercise - 10/29/17 0001      Lumbar Exercises: Stretches   Passive Hamstring Stretch  30 seconds;3 reps    Passive Hamstring Stretch Limitations  -- Performed supine     Single Knee to Chest Stretch  3 reps;20 seconds    Lower Trunk Rotation  10 seconds;5 reps    Piriformis Stretch  30 seconds;3 reps Bilaterally    Piriformis Stretch Limitations  seated      Lumbar Exercises: Standing   Other Standing Lumbar Exercises  Tandem ambulation with cueing to keep pelvis level 4 x 8 feet with upper extremity support      Lumbar Exercises: Supine   Ab Set  15 reps;3 seconds    AB Set Limitations  TA Contractions supine with knees bent    Bridge  10 reps    Straight Leg Raise  10 reps bilaterally with cueing to maintain TA contraction with  each      Lumbar Exercises: Prone   Straight Leg Raise  10 reps bilaterally      Knee/Hip Exercises: Standing   Hip Abduction  Both;10 reps;Knee straight    Hip Extension  Both;10 reps;Knee straight    Extension Limitations  -- Cueing to keep knee straight and toes down    Forward Step Up  Both;Hand Hold: 2;10 reps On airex to challenge     Functional Squat  10 reps with cueing to activate hip abductors with lowering             PT Education - 10/29/17 1400    Education provided  Yes    Education Details  Patient educated about the purposes of the exercises this session and how it will help with decreasing her pain.     Person(s) Educated  Patient    Methods  Explanation    Comprehension  Verbalized understanding       PT Short Term Goals - 10/19/17 1555      PT SHORT TERM GOAL #1   Title  Patient will report understanding and report regular compliance with HEP.     Baseline  Patient educated on HEP.     Time  4    Period  Weeks    Status  New    Target Date  11/16/17      PT SHORT TERM GOAL #2   Title  Patient will demonstrate a 1/2 MMT grade increase in all lower extremity strength planes tested.     Baseline  Patient has weakness in lower extremity hips, knees, and in lumbar spine.     Time  4    Period  Weeks    Status  New    Target Date  11/16/17      PT SHORT TERM GOAL #3   Title  Patient will report a decrease in pain to 3/10 for 1 week in order to improve tolerance for functional activities.     Baseline  4/10 pain    Time  4    Period  Weeks    Status  New    Target Date  11/16/17        PT Long Term Goals - 10/19/17 1559      PT LONG TERM GOAL #1   Title  Patient will demonstrate  5/5 strength of lower extremities in all tested planes in order to increase tolerance for functional activities.     Baseline  Decreased in bilateral hip and knee strength    Time  8    Period  Weeks    Status  New    Target Date  12/14/17      PT LONG TERM GOAL  #2   Title  Patient will report ability to sit for 20 minutes with maximum of 2/10 pain.     Baseline  Patient has 4/10 pain immediately with sitting.     Time  8    Period  Weeks    Status  New    Target Date  12/14/17      PT LONG TERM GOAL #3   Title  Patient's FOTO limitation score will decrease by 13% indicating an increased ease of performing functional activities.     Baseline  60% limited    Time  8    Period  Weeks    Status  New    Target Date  12/14/17            Plan - 10/29/17 1438    Clinical Impression Statement  This session focused initially on lumbar range of motion and lower extremity range of motion. There was a safety drill that took place during the therapy session, and the time participating in that was subtracted from the treatment time. Then progressed to strengthening exercises of the abdominal muscles and lumbar stabilizers and lower extremities in the supine position. Noted Trendelenburg pattern with ambulation this session, so focused a lot on performing exercises to strengthen hip stabilizers in standing particularly hip abductors in order to improve mechanics with functional activities and decrease pain caused by compression of hip or lower back. Additionally focused on balance exercises which also challenged the patients ability to maintain hip stability with tandem ambulation. Patient stated that she was nervous about falling with balance exercises, and therapist ensured patient that this was safe environment to practice balance activities. Plan to continue with functional strengthening of lower extremities and abdominals, range of motion exercises and balance activities which integrate strengthening.     Rehab Potential  Good    Clinical Impairments Affecting Rehab Potential  No trauma; scoliosis; spondylolysis and listhesis    PT Frequency  2x / week    PT Duration  8 weeks    PT Treatment/Interventions  ADLs/Self Care Home Management;Moist Heat;Gait  training;Functional mobility training;Therapeutic activities;Therapeutic exercise;Balance training;Neuromuscular re-education;Patient/family education;Manual techniques;Passive range of motion;Dry needling    PT Next Visit Plan  Continue with progression of lower extremity strengthening, abdominal strengthening and ROM exercises    PT Home Exercise Plan  TA contractions x15 1x/day; TA contractions with heel slide x15 1x/day bilateral lower extremities; seated hamstring stretch 3x30''     Consulted and Agree with Plan of Care  Patient       Patient will benefit from skilled therapeutic intervention in order to improve the following deficits and impairments:  Decreased activity tolerance, Decreased endurance, Decreased range of motion, Decreased strength, Abnormal gait, Pain, Decreased balance, Decreased mobility, Difficulty walking, Impaired flexibility, Postural dysfunction  Visit Diagnosis: Chronic left-sided low back pain without sciatica  Abdominal weakness  Decreased range of motion of lumbar spine  Decreased strength of lower extremity  Decreased functional activity tolerance     Problem List Patient Active Problem List   Diagnosis Date Noted  . Carotid stenosis 01/13/2016  . CVA (cerebral infarction) 10/13/2014  .  Arthritis of knee, degenerative 01/29/2014  . Hypothyroid 06/10/2012  . Hypertension   . 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 KNEE PAIN 03/15/2009  . IMPINGEMENT SYNDROME 03/11/2008  . SHOULDER PAIN 10/16/2007  . HIGH BLOOD PRESSURE 10/16/2007    Clarene Critchley PT, DPT 3:48 PM, 10/29/17 [place] [address] [place], [place], [address] Phone: [phone]   Fax:  [phone]  Name: Sheila Briggs MRN: [id number] Date of Birth: [birth year]

## 2017-11-01 ENCOUNTER — Telehealth (HOSPITAL_COMMUNITY): Payer: Self-pay | Admitting: Physical Therapy

## 2017-11-01 ENCOUNTER — Ambulatory Visit (HOSPITAL_COMMUNITY): Payer: Medicare Other | Admitting: Physical Therapy

## 2017-11-01 DIAGNOSIS — M5386 Other specified dorsopathies, lumbar region: Secondary | ICD-10-CM | POA: Diagnosis not present

## 2017-11-01 DIAGNOSIS — M545 Low back pain, unspecified: Secondary | ICD-10-CM

## 2017-11-01 DIAGNOSIS — R198 Other specified symptoms and signs involving the digestive system and abdomen: Secondary | ICD-10-CM

## 2017-11-01 DIAGNOSIS — G8929 Other chronic pain: Secondary | ICD-10-CM

## 2017-11-01 DIAGNOSIS — R6889 Other general symptoms and signs: Secondary | ICD-10-CM | POA: Diagnosis not present

## 2017-11-01 DIAGNOSIS — R29898 Other symptoms and signs involving the musculoskeletal system: Secondary | ICD-10-CM | POA: Diagnosis not present

## 2017-11-01 NOTE — Therapy (Signed)
[place], [place], [address] Phone: [phone]   Fax:  [phone]  Physical Therapy Treatment  Patient Details  Name: Sheila Briggs MRN: [id number] Date of Birth: January 26, 1932 Referring Provider: Carole Civil MD   Encounter Date: 11/01/2017  PT End of Session - 11/01/17 1426    Visit Number  4    Number of Visits  17    Date for PT Re-Evaluation  11/19/17    Authorization Type  Medicare    Authorization Time Period  10/19/2017 - 12/14/2017    Authorization - Visit Number  4    Authorization - Number of Visits  10    PT Start Time  0867    PT Stop Time  1430    PT Time Calculation (min)  34 min    Activity Tolerance  Patient tolerated treatment well    Behavior During Therapy  [facility] for tasks assessed/performed       Past Medical History:  Diagnosis Date  . Anxiety   . Arthritis   . Cancer (HCC)    basal cell of scalp  . GERD (gastroesophageal reflux disease)   . HOH (hard of hearing)   . Hypercholesteremia   . Hypertension   . Hypothyroidism   . Stroke ([place]) 2015   mild  . Thyroid disease     Past Surgical History:  Procedure Laterality Date  . ABDOMINAL HYSTERECTOMY    . APPENDECTOMY    . BIOPSY THYROID     benign  . CATARACT EXTRACTION W/PHACO  12/19/2012   Procedure: CATARACT EXTRACTION PHACO AND INTRAOCULAR LENS PLACEMENT (IOC);  Surgeon: Tonny Branch, MD;  Location: [facility];  Service: Ophthalmology;  Laterality: Left;  CDE 13.75  . CATARACT EXTRACTION W/PHACO Right 01/06/2013   Procedure: CATARACT EXTRACTION PHACO AND INTRAOCULAR LENS PLACEMENT (IOC);  Surgeon: Tonny Branch, MD;  Location: [facility];  Service: Ophthalmology;  Laterality: Right;  CDE: 9.96  . CHOLECYSTECTOMY    . ENDARTERECTOMY Right 01/13/2016   Procedure: ENDARTERECTOMY CAROTID RIGHT;  Surgeon: Angelia Mould, MD;  Location: [place] [facility];  Service: Vascular;  Laterality: Right;  . PATCH ANGIOPLASTY  01/13/2016   Procedure: RIGHT  CAROTID ARTERY PATCH ANGIOPLASTY USING 1CM X 6CM XENOSURE BIOLOGIC PATCH;  Surgeon: Angelia Mould, MD;  Location: [place];  Service: Vascular;;  . TONSILLECTOMY      There were no vitals filed for this visit.  Subjective Assessment - 11/01/17 1402    Subjective  Pt 10 minutes late for appt.  STates she is not hurting today, doing well overall.     Currently in Pain?  No/denies                      [facility] Adult PT Treatment/Exercise - 11/01/17 0001      Lumbar Exercises: Stretches   Passive Hamstring Stretch  30 seconds;3 reps    Passive Hamstring Stretch Limitations  12'' box      Lumbar Exercises: Standing   Other Standing Lumbar Exercises  Tandem ambulation with cueing to keep pelvis level 4 x 8 feet with upper extremity support      Lumbar Exercises: Supine   Bridge  10 reps    Straight Leg Raise  10 reps      Lumbar Exercises: Sidelying   Hip Abduction  10 reps      Lumbar Exercises: Prone   Straight Leg Raise  10 reps  Knee/Hip Exercises: Standing   Hip Abduction  Both;10 reps;Knee straight    Hip Extension  Both;10 reps;Knee straight    Forward Step Up  Both;Hand Hold: 2;10 reps    Functional Squat  10 reps    SLS with Vectors  5X3" each with 1 HHA bilaterally               PT Short Term Goals - 10/19/17 1555      PT SHORT TERM GOAL #1   Title  Patient will report understanding and report regular compliance with HEP.     Baseline  Patient educated on HEP.     Time  4    Period  Weeks    Status  New    Target Date  11/16/17      PT SHORT TERM GOAL #2   Title  Patient will demonstrate a 1/2 MMT grade increase in all lower extremity strength planes tested.     Baseline  Patient has weakness in lower extremity hips, knees, and in lumbar spine.     Time  4    Period  Weeks    Status  New    Target Date  11/16/17      PT SHORT TERM GOAL #3   Title  Patient will report a decrease in pain to 3/10 for 1 week in order to improve  tolerance for functional activities.     Baseline  4/10 pain    Time  4    Period  Weeks    Status  New    Target Date  11/16/17        PT Long Term Goals - 10/19/17 1559      PT LONG TERM GOAL #1   Title  Patient will demonstrate 5/5 strength of lower extremities in all tested planes in order to increase tolerance for functional activities.     Baseline  Decreased in bilateral hip and knee strength    Time  8    Period  Weeks    Status  New    Target Date  12/14/17      PT LONG TERM GOAL #2   Title  Patient will report ability to sit for 20 minutes with maximum of 2/10 pain.     Baseline  Patient has 4/10 pain immediately with sitting.     Time  8    Period  Weeks    Status  New    Target Date  12/14/17      PT LONG TERM GOAL #3   Title  Patient's FOTO limitation score will decrease by 13% indicating an increased ease of performing functional activities.     Baseline  60% limited    Time  8    Period  Weeks    Status  New    Target Date  12/14/17            Plan - 11/01/17 1427    Clinical Impression Statement  continued with focus on improving functional strength and stability.  Lumbar and LE ROM improving each session.  Added vector stance today to challenge overall stabiltiy with good form.  Continued with exercises added last session to increase LE strength with minimal cues needed. Unable to complete full session due to patient being late for appt.      Rehab Potential  Good    Clinical Impairments Affecting Rehab Potential  No trauma; scoliosis; spondylolysis and listhesis    PT Frequency  2x /  week    PT Duration  8 weeks    PT Treatment/Interventions  ADLs/Self Care Home Management;Moist Heat;Gait training;Functional mobility training;Therapeutic activities;Therapeutic exercise;Balance training;Neuromuscular re-education;Patient/family education;Manual techniques;Passive range of motion;Dry needling    PT Next Visit Plan  Continue with progression of lower  extremity strengthening, abdominal strengthening and ROM exercises    PT Home Exercise Plan  TA contractions x15 1x/day; TA contractions with heel slide x15 1x/day bilateral lower extremities; seated hamstring stretch 3x30''     Consulted and Agree with Plan of Care  Patient       Patient will benefit from skilled therapeutic intervention in order to improve the following deficits and impairments:  Decreased activity tolerance, Decreased endurance, Decreased range of motion, Decreased strength, Abnormal gait, Pain, Decreased balance, Decreased mobility, Difficulty walking, Impaired flexibility, Postural dysfunction  Visit Diagnosis: Abdominal weakness  Chronic left-sided low back pain without sciatica  Decreased range of motion of lumbar spine     Problem List Patient Active Problem List   Diagnosis Date Noted  . Carotid stenosis 01/13/2016  . CVA (cerebral infarction) 10/13/2014  . Arthritis of knee, degenerative 01/29/2014  . Hypothyroid 06/10/2012  . Hypertension   . 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 KNEE PAIN 03/15/2009  . IMPINGEMENT SYNDROME 03/11/2008  . SHOULDER PAIN 10/16/2007  . HIGH BLOOD PRESSURE 10/16/2007   Teena Irani, PTA/CLT [phone]  Teena Irani 11/01/2017, 2:32 PM  [place] [address] [place], [place], [address] Phone: [phone]   Fax:  [phone]  Name: Sheila Briggs MRN: [id number] Date of Birth: [birth year]

## 2017-11-01 NOTE — Telephone Encounter (Signed)
Patient canceled her appt she is having company from out of town

## 2017-11-08 ENCOUNTER — Encounter (HOSPITAL_COMMUNITY): Payer: Medicare Other | Admitting: Physical Therapy

## 2017-11-12 ENCOUNTER — Ambulatory Visit (HOSPITAL_COMMUNITY): Payer: Medicare Other

## 2017-11-12 ENCOUNTER — Encounter (HOSPITAL_COMMUNITY): Payer: Self-pay

## 2017-11-12 DIAGNOSIS — G8929 Other chronic pain: Secondary | ICD-10-CM

## 2017-11-12 DIAGNOSIS — R198 Other specified symptoms and signs involving the digestive system and abdomen: Secondary | ICD-10-CM

## 2017-11-12 DIAGNOSIS — R6889 Other general symptoms and signs: Secondary | ICD-10-CM | POA: Diagnosis not present

## 2017-11-12 DIAGNOSIS — M545 Low back pain, unspecified: Secondary | ICD-10-CM

## 2017-11-12 DIAGNOSIS — M5386 Other specified dorsopathies, lumbar region: Secondary | ICD-10-CM | POA: Diagnosis not present

## 2017-11-12 DIAGNOSIS — R29898 Other symptoms and signs involving the musculoskeletal system: Secondary | ICD-10-CM

## 2017-11-12 NOTE — Therapy (Signed)
[facility] [place], [place], [address] Phone: [phone]   Fax:  [phone]  Physical Therapy Treatment  Patient Details  Name: Sheila Briggs MRN: [id number] Date of Birth: 1932-06-14 Referring Provider: Carole Civil MD   Encounter Date: 11/12/2017  PT End of Session - 11/12/17 1208    Visit Number  5    Number of Visits  17    Date for PT Re-Evaluation  11/19/17    Authorization Type  Medicare    Authorization Time Period  10/19/2017 - 12/14/2017    Authorization - Visit Number  5    Authorization - Number of Visits  10    PT Start Time  1203    PT Stop Time  1242    PT Time Calculation (min)  39 min    Activity Tolerance  Patient tolerated treatment well    Behavior During Therapy  [facility] for tasks assessed/performed       Past Medical History:  Diagnosis Date  . Anxiety   . Arthritis   . Cancer (HCC)    basal cell of scalp  . GERD (gastroesophageal reflux disease)   . HOH (hard of hearing)   . Hypercholesteremia   . Hypertension   . Hypothyroidism   . Stroke ([place]) 2015   mild  . Thyroid disease     Past Surgical History:  Procedure Laterality Date  . ABDOMINAL HYSTERECTOMY    . APPENDECTOMY    . BIOPSY THYROID     benign  . CATARACT EXTRACTION W/PHACO  12/19/2012   Procedure: CATARACT EXTRACTION PHACO AND INTRAOCULAR LENS PLACEMENT (IOC);  Surgeon: Tonny Branch, MD;  [facility];  Service: Ophthalmology;  Laterality: Left;  CDE 13.75  . CATARACT EXTRACTION W/PHACO Right 01/06/2013   Procedure: CATARACT EXTRACTION PHACO AND INTRAOCULAR LENS PLACEMENT (IOC);  Surgeon: Tonny Branch, MD;  Location: [facility];  Service: Ophthalmology;  Laterality: Right;  CDE: 9.96  . CHOLECYSTECTOMY    . ENDARTERECTOMY Right 01/13/2016   Procedure: ENDARTERECTOMY CAROTID RIGHT;  Surgeon: Angelia Mould, MD;  Location: [facility];  Service: Vascular;  Laterality: Right;  . PATCH ANGIOPLASTY  01/13/2016   Procedure: RIGHT  CAROTID ARTERY PATCH ANGIOPLASTY USING 1CM X 6CM XENOSURE BIOLOGIC PATCH;  Surgeon: Angelia Mould, MD;  Location: [facility];  Service: Vascular;;  . TONSILLECTOMY      There were no vitals filed for this visit.  Subjective Assessment - 11/12/17 1207    Subjective  Pt states that this rainy weather is causing her some stiffness in her lower back, right below her waist.    Currently in Pain?  Yes    Pain Score  1     Pain Location  Back    Pain Orientation  Right;Left    Pain Descriptors / Indicators  Tightness    Pain Type  Chronic pain    Pain Onset  More than a month ago    Pain Frequency  Constant    Aggravating Factors   bending down    Pain Relieving Factors  pillow placed behind the back are helpful            [facility] Adult PT Treatment/Exercise - 11/12/17 0001      Lumbar Exercises: Stretches   Single Knee to Chest Stretch  3 reps;20 seconds    ITB Stretch Limitations  seated lumbar flexion stretch 5x10-15" holds each      Lumbar Exercises: Standing   Scapular  Retraction  Both;15 reps;Theraband;Limitations    Theraband Level (Scapular Retraction)  Level 2 (Red)    Scapular Retraction Limitations  2 sets, +ab set    Shoulder Extension  Both;15 reps;Theraband;Limitations    Theraband Level (Shoulder Extension)  Level 2 (Red)    Shoulder Extension Limitations  2 sets, + ab set    Other Standing Lumbar Exercises  standing hip abd with RTB x15reps each; bil SLS 5x10" each with light BUE support; bil SLS with vectors x5RT each with 1 UE light support    Other Standing Lumbar Exercises  fwd/lat step ups on 6" step x10 reps each; sit <> stands with yellow weighted ball 2x10 reps      Lumbar Exercises: Seated   Other Seated Lumbar Exercises  3D thoracic excursions x10 reps each      Lumbar Exercises: Supine   Bent Knee Raise  20 reps    Bent Knee Raise Limitations  + pulldowns with GTB    Dead Bug  10 reps    Dead Bug Limitations  cues for ab set    Bridge  15 reps     Other Supine Lumbar Exercises  --              PT Short Term Goals - 10/19/17 1555      PT SHORT TERM GOAL #1   Title  Patient will report understanding and report regular compliance with HEP.     Baseline  Patient educated on HEP.     Time  4    Period  Weeks    Status  New    Target Date  11/16/17      PT SHORT TERM GOAL #2   Title  Patient will demonstrate a 1/2 MMT grade increase in all lower extremity strength planes tested.     Baseline  Patient has weakness in lower extremity hips, knees, and in lumbar spine.     Time  4    Period  Weeks    Status  New    Target Date  11/16/17      PT SHORT TERM GOAL #3   Title  Patient will report a decrease in pain to 3/10 for 1 week in order to improve tolerance for functional activities.     Baseline  4/10 pain    Time  4    Period  Weeks    Status  New    Target Date  11/16/17        PT Long Term Goals - 10/19/17 1559      PT LONG TERM GOAL #1   Title  Patient will demonstrate 5/5 strength of lower extremities in all tested planes in order to increase tolerance for functional activities.     Baseline  Decreased in bilateral hip and knee strength    Time  8    Period  Weeks    Status  New    Target Date  12/14/17      PT LONG TERM GOAL #2   Title  Patient will report ability to sit for 20 minutes with maximum of 2/10 pain.     Baseline  Patient has 4/10 pain immediately with sitting.     Time  8    Period  Weeks    Status  New    Target Date  12/14/17      PT LONG TERM GOAL #3   Title  Patient's FOTO limitation score will decrease by 13% indicating an increased  ease of performing functional activities.     Baseline  60% limited    Time  8    Period  Weeks    Status  New    Target Date  12/14/17            Plan - 11/12/17 1241    Clinical Impression Statement  Continued with established POC focusing on lumbar ROM, gluteal and core strengthening. Pt tolerated session well with no reports of pain  throughout. Introduced pt to postural strengthening this date with no issues. Continue to progress as tolerated. She reported 0/10 pain/stiffness at EOS.    Rehab Potential  Good    Clinical Impairments Affecting Rehab Potential  No trauma; scoliosis; spondylolysis and listhesis    PT Frequency  2x / week    PT Duration  8 weeks    PT Treatment/Interventions  ADLs/Self Care Home Management;Moist Heat;Gait training;Functional mobility training;Therapeutic activities;Therapeutic exercise;Balance training;Neuromuscular re-education;Patient/family education;Manual techniques;Passive range of motion;Dry needling    PT Next Visit Plan  Continue with progression of lower extremity strengthening, abdominal strengthening and ROM exercises; add sidestepping next visit, continue balance for dynamic hip strengthening and core strengthening    PT Home Exercise Plan  TA contractions x15 1x/day; TA contractions with heel slide x15 1x/day bilateral lower extremities; seated hamstring stretch 3x30''     Consulted and Agree with Plan of Care  Patient       Patient will benefit from skilled therapeutic intervention in order to improve the following deficits and impairments:  Decreased activity tolerance, Decreased endurance, Decreased range of motion, Decreased strength, Abnormal gait, Pain, Decreased balance, Decreased mobility, Difficulty walking, Impaired flexibility, Postural dysfunction  Visit Diagnosis: Abdominal weakness  Chronic left-sided low back pain without sciatica  Decreased range of motion of lumbar spine  Decreased strength of lower extremity  Decreased functional activity tolerance     Problem List Patient Active Problem List   Diagnosis Date Noted  . Carotid stenosis 01/13/2016  . CVA (cerebral infarction) 10/13/2014  . Arthritis of knee, degenerative 01/29/2014  . Hypothyroid 06/10/2012  . Hypertension   . 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 KNEE PAIN 03/15/2009  . IMPINGEMENT  SYNDROME 03/11/2008  . SHOULDER PAIN 10/16/2007  . HIGH BLOOD PRESSURE 10/16/2007       Geraldine Solar PT, DPT   [facility] [address] [place], [place], [address] Phone: [phone]   Fax:  [phone]  Name: BRADI ARBUTHNOT MRN: [id number] Date of Birth: [birth year]

## 2017-11-15 ENCOUNTER — Ambulatory Visit (HOSPITAL_COMMUNITY): Payer: Medicare Other | Attending: Orthopedic Surgery

## 2017-11-15 ENCOUNTER — Encounter (HOSPITAL_COMMUNITY): Payer: Self-pay

## 2017-11-15 DIAGNOSIS — M545 Low back pain: Secondary | ICD-10-CM | POA: Diagnosis not present

## 2017-11-15 DIAGNOSIS — R6889 Other general symptoms and signs: Secondary | ICD-10-CM | POA: Insufficient documentation

## 2017-11-15 DIAGNOSIS — M5386 Other specified dorsopathies, lumbar region: Secondary | ICD-10-CM | POA: Diagnosis not present

## 2017-11-15 DIAGNOSIS — R29898 Other symptoms and signs involving the musculoskeletal system: Secondary | ICD-10-CM | POA: Diagnosis not present

## 2017-11-15 DIAGNOSIS — G8929 Other chronic pain: Secondary | ICD-10-CM | POA: Insufficient documentation

## 2017-11-15 DIAGNOSIS — R198 Other specified symptoms and signs involving the digestive system and abdomen: Secondary | ICD-10-CM | POA: Insufficient documentation

## 2017-11-15 NOTE — Therapy (Signed)
[place], [place], [address] Phone: [phone]   Fax:  [phone]  Physical Therapy Treatment  Patient Details  Name: Sheila Briggs MRN: [id number] Date of Birth: 1932-06-03 Referring Provider: Carole Civil MD   Encounter Date: 11/15/2017  PT End of Session - 11/15/17 1347    Visit Number  6    Number of Visits  17    Date for PT Re-Evaluation  11/19/17    Authorization Type  Medicare    Authorization Time Period  10/19/2017 - 12/14/2017    Authorization - Visit Number  6    Authorization - Number of Visits  10    PT Start Time  7616    PT Stop Time  1427    PT Time Calculation (min)  42 min    Activity Tolerance  Patient tolerated treatment well    Behavior During Therapy  [facility] for tasks assessed/performed       Past Medical History:  Diagnosis Date  . Anxiety   . Arthritis   . Cancer (HCC)    basal cell of scalp  . GERD (gastroesophageal reflux disease)   . HOH (hard of hearing)   . Hypercholesteremia   . Hypertension   . Hypothyroidism   . Stroke ([place]) 2015   mild  . Thyroid disease     Past Surgical History:  Procedure Laterality Date  . ABDOMINAL HYSTERECTOMY    . APPENDECTOMY    . BIOPSY THYROID     benign  . CATARACT EXTRACTION W/PHACO  12/19/2012   Procedure: CATARACT EXTRACTION PHACO AND INTRAOCULAR LENS PLACEMENT (IOC);  Surgeon: Tonny Branch, MD;  [facility];  Service: Ophthalmology;  Laterality: Left;  CDE 13.75  . CATARACT EXTRACTION W/PHACO Right 01/06/2013   Procedure: CATARACT EXTRACTION PHACO AND INTRAOCULAR LENS PLACEMENT (IOC);  Surgeon: Tonny Branch, MD;  [facility];  Service: Ophthalmology;  Laterality: Right;  CDE: 9.96  . CHOLECYSTECTOMY    . ENDARTERECTOMY Right 01/13/2016   Procedure: ENDARTERECTOMY CAROTID RIGHT;  Surgeon: Angelia Mould, MD;  Location: [place];  Service: Vascular;  Laterality: Right;  . PATCH ANGIOPLASTY  01/13/2016   Procedure: RIGHT  CAROTID ARTERY PATCH ANGIOPLASTY USING 1CM X 6CM XENOSURE BIOLOGIC PATCH;  Surgeon: Angelia Mould, MD;  Location: [place];  Service: Vascular;;  . TONSILLECTOMY      There were no vitals filed for this visit.  Subjective Assessment - 11/15/17 1347    Subjective  Pt states that her lower back is a little stiff right now, about a 1/10. She said that she was at the grocery store earlier.     Currently in Pain?  Yes    Pain Score  1     Pain Location  Back    Pain Orientation  Right;Left    Pain Descriptors / Indicators  Tightness    Pain Type  Chronic pain    Pain Onset  More than a month ago    Pain Frequency  Constant    Aggravating Factors   bending down    Pain Relieving Factors  pillow placed behind the back are helpful           [facility] Adult PT Treatment/Exercise - 11/15/17 0001      Lumbar Exercises: Stretches   ITB Stretch Limitations  seated lumbar flexion stretch 5x10-15" holds each      Lumbar Exercises: Standing   Push / Pull Sled  deadlifts from  elevated surface 2x10 reps with 5# DB    Scapular Retraction  Both;20 reps;Theraband    Theraband Level (Scapular Retraction)  Level 2 (Red)    Scapular Retraction Limitations  + ab set    Shoulder Extension  Both;20 reps;Theraband    Theraband Level (Shoulder Extension)  Level 2 (Red)    Shoulder Extension Limitations  + abset    Other Standing Lumbar Exercises  sidestepping with RTB 71ft x1RT; fwd tandem gait x2RT (Bil HHA > 1 HHA)      Lumbar Exercises: Seated   Other Seated Lumbar Exercises  thoracic extension over 1/2 foam roll x10 reps at 3 different segments    Other Seated Lumbar Exercises  3D thoracic excursions x10 reps each with UE movement      Lumbar Exercises: Quadruped   Single Arm Raise  Right;Left;10 reps;5 seconds alternate UE reaches in quadruped    Straight Leg Raise  10 reps;5 seconds alternate LE extension in quadruped    Other Quadruped Lumbar Exercises  firehydrants x10 reps each             PT Short Term Goals - 10/19/17 1555      PT SHORT TERM GOAL #1   Title  Patient will report understanding and report regular compliance with HEP.     Baseline  Patient educated on HEP.     Time  4    Period  Weeks    Status  New    Target Date  11/16/17      PT SHORT TERM GOAL #2   Title  Patient will demonstrate a 1/2 MMT grade increase in all lower extremity strength planes tested.     Baseline  Patient has weakness in lower extremity hips, knees, and in lumbar spine.     Time  4    Period  Weeks    Status  New    Target Date  11/16/17      PT SHORT TERM GOAL #3   Title  Patient will report a decrease in pain to 3/10 for 1 week in order to improve tolerance for functional activities.     Baseline  4/10 pain    Time  4    Period  Weeks    Status  New    Target Date  11/16/17        PT Long Term Goals - 10/19/17 1559      PT LONG TERM GOAL #1   Title  Patient will demonstrate 5/5 strength of lower extremities in all tested planes in order to increase tolerance for functional activities.     Baseline  Decreased in bilateral hip and knee strength    Time  8    Period  Weeks    Status  New    Target Date  12/14/17      PT LONG TERM GOAL #2   Title  Patient will report ability to sit for 20 minutes with maximum of 2/10 pain.     Baseline  Patient has 4/10 pain immediately with sitting.     Time  8    Period  Weeks    Status  New    Target Date  12/14/17      PT LONG TERM GOAL #3   Title  Patient's FOTO limitation score will decrease by 13% indicating an increased ease of performing functional activities.     Baseline  60% limited    Time  8    Period  Weeks    Status  New    Target Date  12/14/17            Plan - 11/15/17 1427    Clinical Impression Statement  Session continued to focus on general mobility and strengthening this date. Updated pt's HEP to include 3D thoracic excursions with UE movements. Progressed core strengthening by  adding in quadruped activities and progressed glute strengthening by adding in deadlifts from an elevated surface this date. She continues to be challenged with tandem gait. Continue as planned.     Rehab Potential  Good    Clinical Impairments Affecting Rehab Potential  No trauma; scoliosis; spondylolysis and listhesis    PT Frequency  2x / week    PT Duration  8 weeks    PT Treatment/Interventions  ADLs/Self Care Home Management;Moist Heat;Gait training;Functional mobility training;Therapeutic activities;Therapeutic exercise;Balance training;Neuromuscular re-education;Patient/family education;Manual techniques;Passive range of motion;Dry needling    PT Next Visit Plan  Continue with progression of lower extremity strengthening, abdominal strengthening and ROM exercises; continue sidestepping, deadlifts, continue balance for dynamic hip strengthening and core strengthening    PT Home Exercise Plan  TA contractions x15 1x/day; TA contractions with heel slide x15 1x/day bilateral lower extremities; seated hamstring stretch 3x30''; 1/3: 3D thoracic excursions with UE movement    Consulted and Agree with Plan of Care  Patient       Patient will benefit from skilled therapeutic intervention in order to improve the following deficits and impairments:  Decreased activity tolerance, Decreased endurance, Decreased range of motion, Decreased strength, Abnormal gait, Pain, Decreased balance, Decreased mobility, Difficulty walking, Impaired flexibility, Postural dysfunction  Visit Diagnosis: Abdominal weakness  Chronic left-sided low back pain without sciatica  Decreased range of motion of lumbar spine  Decreased strength of lower extremity  Decreased functional activity tolerance     Problem List Patient Active Problem List   Diagnosis Date Noted  . Carotid stenosis 01/13/2016  . CVA (cerebral infarction) 10/13/2014  . Arthritis of knee, degenerative 01/29/2014  . Hypothyroid 06/10/2012  .  Hypertension   . 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 KNEE PAIN 03/15/2009  . IMPINGEMENT SYNDROME 03/11/2008  . SHOULDER PAIN 10/16/2007  . HIGH BLOOD PRESSURE 10/16/2007        Geraldine Solar PT, DPT  [employer] [address] [place], [place], [address] Phone: [phone]   Fax:  [phone]  Name: Sheila Briggs MRN: [id number] Date of Birth: 13-Mar-1932

## 2017-11-19 ENCOUNTER — Ambulatory Visit (HOSPITAL_COMMUNITY): Payer: Medicare Other | Admitting: Physical Therapy

## 2017-11-19 DIAGNOSIS — M5386 Other specified dorsopathies, lumbar region: Secondary | ICD-10-CM

## 2017-11-19 DIAGNOSIS — R6889 Other general symptoms and signs: Secondary | ICD-10-CM

## 2017-11-19 DIAGNOSIS — R198 Other specified symptoms and signs involving the digestive system and abdomen: Secondary | ICD-10-CM | POA: Diagnosis not present

## 2017-11-19 DIAGNOSIS — M545 Low back pain, unspecified: Secondary | ICD-10-CM

## 2017-11-19 DIAGNOSIS — G8929 Other chronic pain: Secondary | ICD-10-CM

## 2017-11-19 DIAGNOSIS — R29898 Other symptoms and signs involving the musculoskeletal system: Secondary | ICD-10-CM | POA: Diagnosis not present

## 2017-11-19 NOTE — Therapy (Addendum)
[facility] [address] [place], [place], [address] Phone: [phone]   Fax:  [phone]    Addendum 11/21/17: Pt called and stated that she had her f/u with Dr. Aline Brochure today and he told her that she did not need to continue PT.  PHYSICAL THERAPY DISCHARGE SUMMARY  Visits from Start of Care: 7  Current functional level related to goals / functional outcomes: See treatment below   Remaining deficits: See treatment session below   Education / Equipment: n/a Plan: Patient agrees to discharge.  Patient goals were not met. Patient is being discharged due to the patient's request.  ?????     Geraldine Solar PT, DPT   Physical Therapy Treatment  Patient Details  Name: Sheila Briggs MRN: [id number] Date of Birth: 1932/10/11 Referring Provider: Carole Civil MD   Encounter Date: 11/19/2017  PT End of Session - 11/19/17 1433    Visit Number  7    Number of Visits  17    Date for PT Re-Evaluation  11/19/17    Authorization Type  Medicare    Authorization Time Period  10/19/2017 - 12/14/2017    Authorization - Visit Number  7    Authorization - Number of Visits  10    PT Start Time  4492    PT Stop Time  1427    PT Time Calculation (min)  42 min    Activity Tolerance  Patient tolerated treatment well    Behavior During Therapy  [facility] for tasks assessed/performed       Past Medical History:  Diagnosis Date  . Anxiety   . Arthritis   . Cancer (HCC)    basal cell of scalp  . GERD (gastroesophageal reflux disease)   . HOH (hard of hearing)   . Hypercholesteremia   . Hypertension   . Hypothyroidism   . Stroke ([place]) 2015   mild  . Thyroid disease     Past Surgical History:  Procedure Laterality Date  . ABDOMINAL HYSTERECTOMY    . APPENDECTOMY    . BIOPSY THYROID     benign  . CATARACT EXTRACTION W/PHACO  12/19/2012   Procedure: CATARACT EXTRACTION PHACO AND INTRAOCULAR LENS PLACEMENT (IOC);  Surgeon: Tonny Branch, MD;   [facility];  Service: Ophthalmology;  Laterality: Left;  CDE 13.75  . CATARACT EXTRACTION W/PHACO Right 01/06/2013   Procedure: CATARACT EXTRACTION PHACO AND INTRAOCULAR LENS PLACEMENT (IOC);  Surgeon: Tonny Branch, MD;  [facility]: [facility];  Service: Ophthalmology;  Laterality: Right;  CDE: 9.96  . CHOLECYSTECTOMY    . ENDARTERECTOMY Right 01/13/2016   Procedure: ENDARTERECTOMY CAROTID RIGHT;  Surgeon: Angelia Mould, MD;  Location: [place] [facility];  Service: Vascular;  Laterality: Right;  . PATCH ANGIOPLASTY  01/13/2016   Procedure: RIGHT CAROTID ARTERY PATCH ANGIOPLASTY USING 1CM X 6CM XENOSURE BIOLOGIC PATCH;  Surgeon: Angelia Mould, MD;  Location: [facility];  Service: Vascular;;  . TONSILLECTOMY      There were no vitals filed for this visit.  Subjective Assessment - 11/19/17 1323    Subjective  continued stiffness but no pain.  Reports she sees the MD this week.    Currently in Pain?  No/denies                      [facility] Adult PT Treatment/Exercise - 11/19/17 0001      Lumbar Exercises: Standing   Scapular Retraction  Both;20 reps;Theraband    Theraband Level (  Scapular Retraction)  Level 2 (Red)    Scapular Retraction Limitations  + ab set    Row  Both;20 reps;Theraband    Theraband Level (Row)  Level 2 (Red)    Shoulder Extension  Both;20 reps;Theraband    Theraband Level (Shoulder Extension)  Level 2 (Red)    Other Standing Lumbar Exercises  sidestepping with RTB 63f x2RT; fwd/retro tandem gait x2RT    Other Standing Lumbar Exercises  fwd/lat step ups on 6" step x10 reps each; sit <> stands with yellow weighted ball 2x10 reps      Lumbar Exercises: Seated   Other Seated Lumbar Exercises  thoracic extension over 1/2 foam roll x10 reps at 3 different segments    Other Seated Lumbar Exercises  3D thoracic excursions x10 reps each with UE movement      Lumbar Exercises: Quadruped   Single Arm Raise  Right;Left;10 reps;5 seconds alternating    Straight Leg Raise   10 reps;5 seconds alternating    Other Quadruped Lumbar Exercises  firehydrants x10 reps each               PT Short Term Goals - 10/19/17 1555      PT SHORT TERM GOAL #1   Title  Patient will report understanding and report regular compliance with HEP.     Baseline  Patient educated on HEP.     Time  4    Period  Weeks    Status  New    Target Date  11/16/17      PT SHORT TERM GOAL #2   Title  Patient will demonstrate a 1/2 MMT grade increase in all lower extremity strength planes tested.     Baseline  Patient has weakness in lower extremity hips, knees, and in lumbar spine.     Time  4    Period  Weeks    Status  New    Target Date  11/16/17      PT SHORT TERM GOAL #3   Title  Patient will report a decrease in pain to 3/10 for 1 week in order to improve tolerance for functional activities.     Baseline  4/10 pain    Time  4    Period  Weeks    Status  New    Target Date  11/16/17        PT Long Term Goals - 10/19/17 1559      PT LONG TERM GOAL #1   Title  Patient will demonstrate 5/5 strength of lower extremities in all tested planes in order to increase tolerance for functional activities.     Baseline  Decreased in bilateral hip and knee strength    Time  8    Period  Weeks    Status  New    Target Date  12/14/17      PT LONG TERM GOAL #2   Title  Patient will report ability to sit for 20 minutes with maximum of 2/10 pain.     Baseline  Patient has 4/10 pain immediately with sitting.     Time  8    Period  Weeks    Status  New    Target Date  12/14/17      PT LONG TERM GOAL #3   Title  Patient's FOTO limitation score will decrease by 13% indicating an increased ease of performing functional activities.     Baseline  60% limited    Time  8  Period  Weeks    Status  New    Target Date  12/14/17            Plan - 11/19/17 1435    Clinical Impression Statement  Continued with improving posture, balance and general mobility.  Quadruped  continue to be most challenging, increasing to alternating movments this session.  Balance and confidence improving with tandem gait and added retro gait with activitiy.  Pt only required 1 seated rest break during session today.      Rehab Potential  Good    Clinical Impairments Affecting Rehab Potential  No trauma; scoliosis; spondylolysis and listhesis    PT Frequency  2x / week    PT Duration  8 weeks    PT Treatment/Interventions  ADLs/Self Care Home Management;Moist Heat;Gait training;Functional mobility training;Therapeutic activities;Therapeutic exercise;Balance training;Neuromuscular re-education;Patient/family education;Manual techniques;Passive range of motion;Dry needling    PT Next Visit Plan  Continue with progression of lower extremity strengthening, abdominal strengthening and ROM exercises; continue sidestepping, deadlifts, continue balance for dynamic hip strengthening and core strengthening.  Next session add alternating UE/LE in quadruped.    PT Home Exercise Plan  TA contractions x15 1x/day; TA contractions with heel slide x15 1x/day bilateral lower extremities; seated hamstring stretch 3x30''; 1/3: 3D thoracic excursions with UE movement    Consulted and Agree with Plan of Care  Patient       Patient will benefit from skilled therapeutic intervention in order to improve the following deficits and impairments:  Decreased activity tolerance, Decreased endurance, Decreased range of motion, Decreased strength, Abnormal gait, Pain, Decreased balance, Decreased mobility, Difficulty walking, Impaired flexibility, Postural dysfunction  Visit Diagnosis: Abdominal weakness  Chronic left-sided low back pain without sciatica  Decreased range of motion of lumbar spine  Decreased strength of lower extremity  Decreased functional activity tolerance     Problem List Patient Active Problem List   Diagnosis Date Noted  . Carotid stenosis 01/13/2016  . CVA (cerebral infarction)  10/13/2014  . Arthritis of knee, degenerative 01/29/2014  . Hypothyroid 06/10/2012  . Hypertension   . 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 KNEE PAIN 03/15/2009  . IMPINGEMENT SYNDROME 03/11/2008  . SHOULDER PAIN 10/16/2007  . HIGH BLOOD PRESSURE 10/16/2007   Teena Irani, PTA/CLT [phone]  Teena Irani 11/19/2017, 4:00 PM  [facility] [address] [place], [place], [address] Phone: [phone]   Fax:  [phone]  Name: Sheila Briggs MRN: [id number] Date of Birth: 08/25/1932

## 2017-11-21 ENCOUNTER — Ambulatory Visit (INDEPENDENT_AMBULATORY_CARE_PROVIDER_SITE_OTHER): Payer: Medicare Other | Admitting: Orthopedic Surgery

## 2017-11-21 ENCOUNTER — Encounter: Payer: Self-pay | Admitting: Orthopedic Surgery

## 2017-11-21 ENCOUNTER — Telehealth (HOSPITAL_COMMUNITY): Payer: Self-pay | Admitting: Internal Medicine

## 2017-11-21 VITALS — BP 134/68 | HR 83 | Ht 61.0 in | Wt 120.0 lb

## 2017-11-21 DIAGNOSIS — M4150 Other secondary scoliosis, site unspecified: Secondary | ICD-10-CM | POA: Diagnosis not present

## 2017-11-21 DIAGNOSIS — M4317 Spondylolisthesis, lumbosacral region: Secondary | ICD-10-CM

## 2017-11-21 DIAGNOSIS — M545 Low back pain, unspecified: Secondary | ICD-10-CM

## 2017-11-21 DIAGNOSIS — M4306 Spondylolysis, lumbar region: Secondary | ICD-10-CM | POA: Diagnosis not present

## 2017-11-21 NOTE — Telephone Encounter (Signed)
11/21/16  pt left a message that she saw Dr. Aline Brochure and was told that she didn't need to come to therapy anymore

## 2017-11-21 NOTE — Progress Notes (Signed)
FOLLOW UP   Chief Complaint  Patient presents with  . Back Pain    improving with physical therapy      84 year old female presents back for reevaluation after physical therapy for lower back pain  She has completed her therapy and her back pain has resolved her movement is better and she is having no problems at this time    Review of Systems  Constitutional: Negative for chills, fever, malaise/fatigue and weight loss.  Neurological: Negative for tingling and focal weakness.      BP 134/68   Pulse 83   Ht 5\' 1"  (1.549 m)   Wt 120 lb (54.4 kg)   BMI 22.67 kg/m   Physical Exam  Constitutional: She is oriented to person, place, and time. She appears well-developed and well-nourished.  Musculoskeletal:       Lumbar back: She exhibits decreased range of motion. She exhibits no tenderness, no bony tenderness, no swelling, no edema, no deformity, no laceration, no pain and no spasm.  Neurological: She is alert and oriented to person, place, and time.  Psychiatric: She has a normal mood and affect. Judgment normal.  Vitals reviewed.    ASSESSMENT AND PLAN  Encounter Diagnoses  Name Primary?  . Acute low back pain without sciatica, unspecified back pain laterality Yes  . Spondylolysis, lumbar region   . Spondylolisthesis at L5-S1 level   . Degenerative scoliosis in adult patient      Improved.  The patient will follow as needed

## 2017-11-22 ENCOUNTER — Ambulatory Visit (HOSPITAL_COMMUNITY): Payer: Medicare Other

## 2017-11-29 DIAGNOSIS — I10 Essential (primary) hypertension: Secondary | ICD-10-CM | POA: Diagnosis not present

## 2017-12-06 ENCOUNTER — Ambulatory Visit (INDEPENDENT_AMBULATORY_CARE_PROVIDER_SITE_OTHER): Payer: Medicare Other | Admitting: Otolaryngology

## 2017-12-06 DIAGNOSIS — D440 Neoplasm of uncertain behavior of thyroid gland: Secondary | ICD-10-CM

## 2017-12-06 DIAGNOSIS — J310 Chronic rhinitis: Secondary | ICD-10-CM | POA: Diagnosis not present

## 2017-12-25 DIAGNOSIS — Z961 Presence of intraocular lens: Secondary | ICD-10-CM | POA: Diagnosis not present

## 2017-12-25 DIAGNOSIS — H35033 Hypertensive retinopathy, bilateral: Secondary | ICD-10-CM | POA: Diagnosis not present

## 2017-12-25 DIAGNOSIS — Z9849 Cataract extraction status, unspecified eye: Secondary | ICD-10-CM | POA: Diagnosis not present

## 2017-12-25 DIAGNOSIS — H43813 Vitreous degeneration, bilateral: Secondary | ICD-10-CM | POA: Diagnosis not present

## 2018-01-09 DIAGNOSIS — C44319 Basal cell carcinoma of skin of other parts of face: Secondary | ICD-10-CM | POA: Diagnosis not present

## 2018-01-09 DIAGNOSIS — B078 Other viral warts: Secondary | ICD-10-CM | POA: Diagnosis not present

## 2018-01-21 ENCOUNTER — Encounter: Payer: Self-pay | Admitting: Cardiology

## 2018-02-13 DIAGNOSIS — Z08 Encounter for follow-up examination after completed treatment for malignant neoplasm: Secondary | ICD-10-CM | POA: Diagnosis not present

## 2018-02-13 DIAGNOSIS — Z85828 Personal history of other malignant neoplasm of skin: Secondary | ICD-10-CM | POA: Diagnosis not present

## 2018-02-13 DIAGNOSIS — B078 Other viral warts: Secondary | ICD-10-CM | POA: Diagnosis not present

## 2018-02-20 ENCOUNTER — Ambulatory Visit (HOSPITAL_COMMUNITY)
Admission: RE | Admit: 2018-02-20 | Discharge: 2018-02-20 | Disposition: A | Discharge status: Home / Self Care | Payer: Medicare Other | Source: Ambulatory Visit | Attending: Vascular Surgery | Admitting: Vascular Surgery

## 2018-02-20 ENCOUNTER — Ambulatory Visit (INDEPENDENT_AMBULATORY_CARE_PROVIDER_SITE_OTHER): Payer: Medicare Other | Admitting: Vascular Surgery

## 2018-02-20 ENCOUNTER — Encounter: Payer: Self-pay | Admitting: Vascular Surgery

## 2018-02-20 ENCOUNTER — Other Ambulatory Visit: Payer: Self-pay

## 2018-02-20 VITALS — BP 155/76 | HR 70 | Resp 18 | Ht 61.0 in | Wt 121.0 lb

## 2018-02-20 DIAGNOSIS — I6521 Occlusion and stenosis of right carotid artery: Secondary | ICD-10-CM

## 2018-02-20 NOTE — Progress Notes (Signed)
Patient name: Sheila Briggs MRN: [id number] DOB: 1932/02/20 Sex: female  REASON FOR VISIT:   Follow-up of carotid disease.   HPI:   Sheila Briggs is a pleasant 84 y.o. female who underwent a right carotid endarterectomy in March 2017 she comes in for a yearly follow-up visit.  Since I saw her last, she denies any history of stroke, TIAs, expressive or receptive aphasia, or amaurosis fugax.  She remains very active.  She is not a smoker.  She is on aspirin and is on a statin.  Past Medical History:  Diagnosis Date  . Anxiety   . Arthritis   . Cancer (HCC)    basal cell of scalp  . GERD (gastroesophageal reflux disease)   . HOH (hard of hearing)   . Hypercholesteremia   . Hypertension   . Hypothyroidism   . Stroke ([facility]) 2015   mild  . Thyroid disease     History reviewed. No pertinent family history.  SOCIAL HISTORY: Social History   Tobacco Use  . Smoking status: Never Smoker  . Smokeless tobacco: Never Used  Substance Use Topics  . Alcohol use: No    No Known Allergies  Current Outpatient Medications  Medication Sig Dispense Refill  . amLODipine (NORVASC) 2.5 MG tablet Take 5 mg by mouth daily.     Marland Kitchen aspirin EC 81 MG tablet Take 301 mg by mouth daily.     Marland Kitchen atorvastatin (LIPITOR) 40 MG tablet Take 1 tablet (40 mg total) by mouth daily. (Patient taking differently: Take 20 mg by mouth daily. ) 30 tablet 12  . chlorthalidone (HYGROTON) 25 MG tablet Take 25 mg by mouth daily.    . citalopram (CELEXA) 10 MG tablet Take 10 mg by mouth daily.    . diclofenac (CATAFLAM) 50 MG tablet Take 1 tablet (50 mg total) by mouth 2 (two) times daily. 90 tablet 3  . hydrochlorothiazide (HYDRODIURIL) 12.5 MG tablet Take 25 mg by mouth daily.     Marland Kitchen levothyroxine (SYNTHROID, LEVOTHROID) 25 MCG tablet Take 25 mcg by mouth daily before breakfast.    . meclizine (ANTIVERT) 12.5 MG tablet Take 1 tablet (12.5 mg total) by mouth 3 (three) times daily as needed for dizziness. 30 tablet 0  .  Multiple Vitamin (MULTIVITAMIN WITH MINERALS) TABS Take 1 tablet by mouth daily.    Marland Kitchen olmesartan (BENICAR) 20 MG tablet   2  . Polyethyl Glycol-Propyl Glycol (SYSTANE ULTRA OP) Apply 1 drop to eye 4 (four) times daily.    . potassium chloride SA (K-DUR,KLOR-CON) 20 MEQ tablet Take 1 tablet (20 mEq total) by mouth 2 (two) times daily. 30 tablet 0  . traMADol (ULTRAM) 50 MG tablet Take 1 tablet (50 mg total) by mouth every 8 (eight) hours as needed. 20 tablet 0   No current facility-administered medications for this visit.     REVIEW OF SYSTEMS:  [X]  denotes positive finding, [ ]  denotes negative finding Cardiac  Comments:  Chest pain or chest pressure:    Shortness of breath upon exertion:    Short of breath when lying flat:    Irregular heart rhythm:        Vascular    Pain in calf, thigh, or hip brought on by ambulation:    Pain in feet at night that wakes you up from your sleep:     Blood clot in your veins:    Leg swelling:         Pulmonary  Oxygen at home:    Productive cough:     Wheezing:         Neurologic    Sudden weakness in arms or legs:     Sudden numbness in arms or legs:     Sudden onset of difficulty speaking or slurred speech:    Temporary loss of vision in one eye:     Problems with dizziness:         Gastrointestinal    Blood in stool:     Vomited blood:         Genitourinary    Burning when urinating:     Blood in urine:        Psychiatric    Major depression:         Hematologic    Bleeding problems:    Problems with blood clotting too easily:        Skin    Rashes or ulcers:        Constitutional    Fever or chills:     PHYSICAL EXAM:   Vitals:   02/20/18 1445 02/20/18 1447  BP: (!) 159/70 (!) 155/76  Pulse: 70   Resp: 18   SpO2: 98%   Weight: 121 lb (54.9 kg)   Height: 5\' 1"  (1.549 m)     GENERAL: The patient is a well-nourished female, in no acute distress. The vital signs are documented above. CARDIAC: There is a regular  rate and rhythm.  VASCULAR: I do not detect carotid bruits. Both feet are warm and well-perfused. She has no significant lower extremity swelling. PULMONARY: There is good air exchange bilaterally without wheezing or rales. ABDOMEN: Soft and non-tender with normal pitched bowel sounds.  MUSCULOSKELETAL: There are no major deformities or cyanosis. NEUROLOGIC: No focal weakness or paresthesias are detected. SKIN: There are no ulcers or rashes noted. PSYCHIATRIC: The patient has a normal affect.  DATA:    CAROTID DUPLEX: I have independently interpreted her carotid duplex scan today.  She has no evidence of recurrent carotid stenosis on the right.  She has no left carotid stenosis.  Both vertebral arteries are patent with antegrade flow.  MEDICAL ISSUES:   STATUS POST RIGHT CAROTID ENDARTERECTOMY: The patient is doing well status post right carotid endarterectomy.  She has no evidence of recurrent stenosis.  She has no significant stenosis on the left.  She will simply need a yearly follow-up visit.  I have ordered a carotid duplex scan in 1 year and I will see her back at that time.  She knows to remain on her aspirin and statin.  She will call sooner if there are any problems.  [name] [facility] [phone]

## 2018-03-28 DIAGNOSIS — Z8673 Personal history of transient ischemic attack (TIA), and cerebral infarction without residual deficits: Secondary | ICD-10-CM | POA: Diagnosis not present

## 2018-03-28 DIAGNOSIS — I10 Essential (primary) hypertension: Secondary | ICD-10-CM | POA: Diagnosis not present

## 2018-04-05 ENCOUNTER — Other Ambulatory Visit (HOSPITAL_COMMUNITY): Payer: Self-pay | Admitting: Internal Medicine

## 2018-04-05 DIAGNOSIS — Z1231 Encounter for screening mammogram for malignant neoplasm of breast: Secondary | ICD-10-CM

## 2018-04-15 ENCOUNTER — Ambulatory Visit (HOSPITAL_COMMUNITY): Payer: Medicare Other

## 2018-04-24 ENCOUNTER — Encounter (HOSPITAL_COMMUNITY): Payer: Self-pay

## 2018-04-24 ENCOUNTER — Ambulatory Visit (HOSPITAL_COMMUNITY)
Admission: RE | Admit: 2018-04-24 | Discharge: 2018-04-24 | Disposition: A | Discharge status: Home / Self Care | Payer: Medicare Other | Source: Ambulatory Visit | Attending: Internal Medicine | Admitting: Internal Medicine

## 2018-04-24 DIAGNOSIS — Z1231 Encounter for screening mammogram for malignant neoplasm of breast: Secondary | ICD-10-CM | POA: Diagnosis not present

## 2018-08-14 DIAGNOSIS — Z85828 Personal history of other malignant neoplasm of skin: Secondary | ICD-10-CM | POA: Diagnosis not present

## 2018-08-14 DIAGNOSIS — I10 Essential (primary) hypertension: Secondary | ICD-10-CM | POA: Diagnosis not present

## 2018-08-14 DIAGNOSIS — I2510 Atherosclerotic heart disease of native coronary artery without angina pectoris: Secondary | ICD-10-CM | POA: Diagnosis not present

## 2018-08-14 DIAGNOSIS — L820 Inflamed seborrheic keratosis: Secondary | ICD-10-CM | POA: Diagnosis not present

## 2018-08-14 DIAGNOSIS — G464 Cerebellar stroke syndrome: Secondary | ICD-10-CM | POA: Diagnosis not present

## 2018-08-14 DIAGNOSIS — Z79899 Other long term (current) drug therapy: Secondary | ICD-10-CM | POA: Diagnosis not present

## 2018-08-14 DIAGNOSIS — Z08 Encounter for follow-up examination after completed treatment for malignant neoplasm: Secondary | ICD-10-CM | POA: Diagnosis not present

## 2018-08-14 DIAGNOSIS — K219 Gastro-esophageal reflux disease without esophagitis: Secondary | ICD-10-CM | POA: Diagnosis not present

## 2018-08-14 DIAGNOSIS — E785 Hyperlipidemia, unspecified: Secondary | ICD-10-CM | POA: Diagnosis not present

## 2018-08-22 DIAGNOSIS — Z23 Encounter for immunization: Secondary | ICD-10-CM | POA: Diagnosis not present

## 2018-08-22 DIAGNOSIS — I10 Essential (primary) hypertension: Secondary | ICD-10-CM | POA: Diagnosis not present

## 2018-08-22 DIAGNOSIS — E785 Hyperlipidemia, unspecified: Secondary | ICD-10-CM | POA: Diagnosis not present

## 2018-08-22 DIAGNOSIS — Z8673 Personal history of transient ischemic attack (TIA), and cerebral infarction without residual deficits: Secondary | ICD-10-CM | POA: Diagnosis not present

## 2018-08-22 DIAGNOSIS — F3340 Major depressive disorder, recurrent, in remission, unspecified: Secondary | ICD-10-CM | POA: Diagnosis not present

## 2018-11-14 ENCOUNTER — Other Ambulatory Visit (HOSPITAL_COMMUNITY): Payer: Self-pay | Admitting: Otolaryngology

## 2018-11-14 ENCOUNTER — Other Ambulatory Visit: Payer: Self-pay | Admitting: Otolaryngology

## 2018-11-14 DIAGNOSIS — E041 Nontoxic single thyroid nodule: Secondary | ICD-10-CM

## 2018-11-20 ENCOUNTER — Ambulatory Visit (HOSPITAL_COMMUNITY)
Admission: RE | Admit: 2018-11-20 | Discharge: 2018-11-20 | Disposition: A | Discharge status: Home / Self Care | Payer: Medicare Other | Source: Ambulatory Visit | Attending: Otolaryngology | Admitting: Otolaryngology

## 2018-11-20 DIAGNOSIS — E041 Nontoxic single thyroid nodule: Secondary | ICD-10-CM | POA: Diagnosis not present

## 2018-11-20 DIAGNOSIS — E042 Nontoxic multinodular goiter: Secondary | ICD-10-CM | POA: Diagnosis not present

## 2018-11-22 ENCOUNTER — Ambulatory Visit (HOSPITAL_COMMUNITY): Payer: Medicare Other

## 2018-12-03 DIAGNOSIS — H35033 Hypertensive retinopathy, bilateral: Secondary | ICD-10-CM | POA: Diagnosis not present

## 2018-12-03 DIAGNOSIS — Z961 Presence of intraocular lens: Secondary | ICD-10-CM | POA: Diagnosis not present

## 2018-12-03 DIAGNOSIS — Z9849 Cataract extraction status, unspecified eye: Secondary | ICD-10-CM | POA: Diagnosis not present

## 2018-12-03 DIAGNOSIS — H04123 Dry eye syndrome of bilateral lacrimal glands: Secondary | ICD-10-CM | POA: Diagnosis not present

## 2018-12-05 ENCOUNTER — Ambulatory Visit (INDEPENDENT_AMBULATORY_CARE_PROVIDER_SITE_OTHER): Payer: Medicare Other | Admitting: Otolaryngology

## 2018-12-05 DIAGNOSIS — D440 Neoplasm of uncertain behavior of thyroid gland: Secondary | ICD-10-CM

## 2018-12-10 ENCOUNTER — Other Ambulatory Visit (HOSPITAL_COMMUNITY): Payer: Self-pay | Admitting: Otolaryngology

## 2018-12-10 ENCOUNTER — Other Ambulatory Visit: Payer: Self-pay | Admitting: Otolaryngology

## 2018-12-10 DIAGNOSIS — E041 Nontoxic single thyroid nodule: Secondary | ICD-10-CM

## 2018-12-18 DIAGNOSIS — E039 Hypothyroidism, unspecified: Secondary | ICD-10-CM | POA: Diagnosis not present

## 2018-12-18 DIAGNOSIS — E042 Nontoxic multinodular goiter: Secondary | ICD-10-CM | POA: Diagnosis not present

## 2018-12-24 DIAGNOSIS — E039 Hypothyroidism, unspecified: Secondary | ICD-10-CM | POA: Diagnosis not present

## 2018-12-24 DIAGNOSIS — I10 Essential (primary) hypertension: Secondary | ICD-10-CM | POA: Diagnosis not present

## 2018-12-26 ENCOUNTER — Ambulatory Visit (HOSPITAL_COMMUNITY)
Admission: RE | Admit: 2018-12-26 | Discharge: 2018-12-26 | Disposition: A | Discharge status: Home / Self Care | Payer: Medicare Other | Source: Ambulatory Visit | Attending: Otolaryngology | Admitting: Otolaryngology

## 2018-12-26 DIAGNOSIS — E041 Nontoxic single thyroid nodule: Secondary | ICD-10-CM | POA: Insufficient documentation

## 2018-12-26 MED ORDER — LIDOCAINE HCL (PF) 2 % IJ SOLN
INTRAMUSCULAR | Status: AC
  Administered 2018-12-26: 10 mL
  Filled 2018-12-26: qty 10

## 2019-04-29 DIAGNOSIS — I10 Essential (primary) hypertension: Secondary | ICD-10-CM | POA: Diagnosis not present

## 2019-04-29 DIAGNOSIS — Z8673 Personal history of transient ischemic attack (TIA), and cerebral infarction without residual deficits: Secondary | ICD-10-CM | POA: Diagnosis not present

## 2019-06-04 ENCOUNTER — Other Ambulatory Visit (HOSPITAL_COMMUNITY): Payer: Self-pay | Admitting: Internal Medicine

## 2019-06-04 DIAGNOSIS — Z1231 Encounter for screening mammogram for malignant neoplasm of breast: Secondary | ICD-10-CM

## 2019-06-06 ENCOUNTER — Other Ambulatory Visit: Payer: Medicare Other

## 2019-06-06 DIAGNOSIS — R6889 Other general symptoms and signs: Secondary | ICD-10-CM | POA: Diagnosis not present

## 2019-06-06 DIAGNOSIS — Z20822 Contact with and (suspected) exposure to covid-19: Secondary | ICD-10-CM

## 2019-06-09 LAB — NOVEL CORONAVIRUS, NAA: SARS-CoV-2, NAA: NOT DETECTED

## 2019-07-16 ENCOUNTER — Ambulatory Visit (HOSPITAL_COMMUNITY)
Admission: RE | Admit: 2019-07-16 | Discharge: 2019-07-16 | Disposition: A | Discharge status: Home / Self Care | Payer: Medicare Other | Source: Ambulatory Visit | Attending: Internal Medicine | Admitting: Internal Medicine

## 2019-07-16 ENCOUNTER — Other Ambulatory Visit: Payer: Self-pay

## 2019-07-16 DIAGNOSIS — Z1231 Encounter for screening mammogram for malignant neoplasm of breast: Secondary | ICD-10-CM

## 2019-08-21 ENCOUNTER — Other Ambulatory Visit: Payer: Self-pay | Admitting: *Deleted

## 2019-08-21 DIAGNOSIS — Z20822 Contact with and (suspected) exposure to covid-19: Secondary | ICD-10-CM

## 2019-08-21 DIAGNOSIS — Z20828 Contact with and (suspected) exposure to other viral communicable diseases: Secondary | ICD-10-CM | POA: Diagnosis not present

## 2019-08-22 LAB — NOVEL CORONAVIRUS, NAA: SARS-CoV-2, NAA: NOT DETECTED

## 2019-08-25 ENCOUNTER — Other Ambulatory Visit: Payer: Self-pay

## 2019-08-25 DIAGNOSIS — I6529 Occlusion and stenosis of unspecified carotid artery: Secondary | ICD-10-CM

## 2019-08-26 ENCOUNTER — Telehealth (HOSPITAL_COMMUNITY): Payer: Self-pay

## 2019-08-26 NOTE — Telephone Encounter (Signed)

## 2019-08-27 ENCOUNTER — Ambulatory Visit (INDEPENDENT_AMBULATORY_CARE_PROVIDER_SITE_OTHER): Payer: Medicare Other | Admitting: Vascular Surgery

## 2019-08-27 ENCOUNTER — Ambulatory Visit (HOSPITAL_COMMUNITY)
Admission: RE | Admit: 2019-08-27 | Discharge: 2019-08-27 | Disposition: A | Discharge status: Home / Self Care | Payer: Medicare Other | Source: Ambulatory Visit | Attending: Family | Admitting: Family

## 2019-08-27 ENCOUNTER — Encounter: Payer: Self-pay | Admitting: Vascular Surgery

## 2019-08-27 ENCOUNTER — Other Ambulatory Visit: Payer: Self-pay

## 2019-08-27 VITALS — BP 147/69 | HR 87 | Temp 97.7°F | Resp 18 | Ht 61.0 in | Wt 124.0 lb

## 2019-08-27 DIAGNOSIS — I6529 Occlusion and stenosis of unspecified carotid artery: Secondary | ICD-10-CM | POA: Insufficient documentation

## 2019-08-27 DIAGNOSIS — I6521 Occlusion and stenosis of right carotid artery: Secondary | ICD-10-CM

## 2019-08-27 NOTE — Progress Notes (Signed)
Patient name: Sheila Briggs MRN: [id number] DOB: 07-14-1932 Sex: female  REASON FOR VISIT:   Follow-up of carotid disease.  HPI:   Sheila Briggs is a pleasant 85 y.o. female who underwent a right carotid endarterectomy on 01/13/2016.  She comes in for a routine follow-up study.  I last saw her on 02/20/2018.  Her right carotid endarterectomy site was widely patent.  She had no significant stenosis on the left.  She comes in for a yearly follow-up duplex.  Since I saw her last she denies any history of stroke, TIAs, expressive or receptive aphasia, or amaurosis fugax.  She is on aspirin and is on a statin.  There have been no significant changes to her medical history.  Past Medical History:  Diagnosis Date  . Anxiety   . Arthritis   . Cancer (HCC)    basal cell of scalp  . GERD (gastroesophageal reflux disease)   . HOH (hard of hearing)   . Hypercholesteremia   . Hypertension   . Hypothyroidism   . Stroke ([place]) 2015   mild  . Thyroid disease     History reviewed. No pertinent family history.  SOCIAL HISTORY: Social History   Tobacco Use  . Smoking status: Never Smoker  . Smokeless tobacco: Never Used  Substance Use Topics  . Alcohol use: No    No Known Allergies  Current Outpatient Medications  Medication Sig Dispense Refill  . amLODipine (NORVASC) 2.5 MG tablet Take 5 mg by mouth daily.     Marland Kitchen aspirin EC 81 MG tablet Take 301 mg by mouth daily.     Marland Kitchen atorvastatin (LIPITOR) 40 MG tablet Take 1 tablet (40 mg total) by mouth daily. (Patient taking differently: Take 20 mg by mouth daily. ) 30 tablet 12  . chlorthalidone (HYGROTON) 25 MG tablet Take 25 mg by mouth daily.    . citalopram (CELEXA) 10 MG tablet Take 10 mg by mouth daily.    . diclofenac (CATAFLAM) 50 MG tablet Take 1 tablet (50 mg total) by mouth 2 (two) times daily. 90 tablet 3  . hydrochlorothiazide (HYDRODIURIL) 12.5 MG tablet Take 25 mg by mouth daily.     Marland Kitchen levothyroxine (SYNTHROID, LEVOTHROID) 25  MCG tablet Take 25 mcg by mouth daily before breakfast.    . meclizine (ANTIVERT) 12.5 MG tablet Take 1 tablet (12.5 mg total) by mouth 3 (three) times daily as needed for dizziness. 30 tablet 0  . Multiple Vitamin (MULTIVITAMIN WITH MINERALS) TABS Take 1 tablet by mouth daily.    Marland Kitchen olmesartan (BENICAR) 20 MG tablet   2  . pantoprazole (PROTONIX) 40 MG tablet TK ONE T PO D    . Polyethyl Glycol-Propyl Glycol (SYSTANE ULTRA OP) Apply 1 drop to eye 4 (four) times daily.    . potassium chloride SA (K-DUR,KLOR-CON) 20 MEQ tablet Take 1 tablet (20 mEq total) by mouth 2 (two) times daily. 30 tablet 0  . traMADol (ULTRAM) 50 MG tablet Take 1 tablet (50 mg total) by mouth every 8 (eight) hours as needed. 20 tablet 0   No current facility-administered medications for this visit.     REVIEW OF SYSTEMS:  [X]  denotes positive finding, [ ]  denotes negative finding Cardiac  Comments:  Chest pain or chest pressure:    Shortness of breath upon exertion:    Short of breath when lying flat:    Irregular heart rhythm:        Vascular    Pain in calf, thigh,  or hip brought on by ambulation:    Pain in feet at night that wakes you up from your sleep:     Blood clot in your veins:    Leg swelling:         Pulmonary    Oxygen at home:    Productive cough:     Wheezing:         Neurologic    Sudden weakness in arms or legs:     Sudden numbness in arms or legs:     Sudden onset of difficulty speaking or slurred speech:    Temporary loss of vision in one eye:     Problems with dizziness:         Gastrointestinal    Blood in stool:     Vomited blood:         Genitourinary    Burning when urinating:     Blood in urine:        Psychiatric    Major depression:         Hematologic    Bleeding problems:    Problems with blood clotting too easily:        Skin    Rashes or ulcers:        Constitutional    Fever or chills:     PHYSICAL EXAM:   Vitals:   08/27/19 1539 08/27/19 1541  BP:  (!) 143/72 (!) 147/69  Pulse: 87   Resp: 18   Temp: 97.7 F (36.5 C)   SpO2: 98%   Weight: 124 lb (56.2 kg)   Height: 5\' 1"  (1.549 m)     GENERAL: The patient is a well-nourished female, in no acute distress. The vital signs are documented above. CARDIAC: There is a regular rate and rhythm.  VASCULAR: I do not detect carotid bruits. Both feet are warm and well-perfused. She has no significant lower extremity swelling. PULMONARY: There is good air exchange bilaterally without wheezing or rales. ABDOMEN: Soft and non-tender with normal pitched bowel sounds.  MUSCULOSKELETAL: There are no major deformities or cyanosis. NEUROLOGIC: No focal weakness or paresthesias are detected. SKIN: There are no ulcers or rashes noted. PSYCHIATRIC: The patient has a normal affect.  DATA:    CAROTID DUPLEX: I have independently interpreted her carotid duplex scan today.  On the right side her carotid endarterectomy site is widely patent.  The right vertebral artery is patent with antegrade flow.  On the left side she has a less than 39% stenosis.  The left vertebral artery is patent with antegrade flow.  MEDICAL ISSUES:   STATUS POST RIGHT CAROTID ENDARTERECTOMY: Patient's right carotid endarterectomy site is widely patent.  She has no evidence of stenosis on the left.  She is asymptomatic.  Normally we would recommend a follow-up duplex scan yearly.  However, given her age I think would be reasonable to stretch her follow-up out to 18 months.  I have ordered a follow-up carotid duplex scan in 18 months and I will see her back at that time.  She knows to call sooner if she has problems.  In the meantime she will remain on her aspirin and statin.  [name] [facility] [phone]

## 2019-09-02 DIAGNOSIS — E039 Hypothyroidism, unspecified: Secondary | ICD-10-CM | POA: Diagnosis not present

## 2019-09-02 DIAGNOSIS — E785 Hyperlipidemia, unspecified: Secondary | ICD-10-CM | POA: Diagnosis not present

## 2019-09-02 DIAGNOSIS — K219 Gastro-esophageal reflux disease without esophagitis: Secondary | ICD-10-CM | POA: Diagnosis not present

## 2019-09-02 DIAGNOSIS — F329 Major depressive disorder, single episode, unspecified: Secondary | ICD-10-CM | POA: Diagnosis not present

## 2019-09-02 DIAGNOSIS — G464 Cerebellar stroke syndrome: Secondary | ICD-10-CM | POA: Diagnosis not present

## 2019-09-02 DIAGNOSIS — I2510 Atherosclerotic heart disease of native coronary artery without angina pectoris: Secondary | ICD-10-CM | POA: Diagnosis not present

## 2019-09-02 DIAGNOSIS — I10 Essential (primary) hypertension: Secondary | ICD-10-CM | POA: Diagnosis not present

## 2019-09-11 DIAGNOSIS — I491 Atrial premature depolarization: Secondary | ICD-10-CM | POA: Diagnosis not present

## 2019-09-11 DIAGNOSIS — E876 Hypokalemia: Secondary | ICD-10-CM | POA: Diagnosis not present

## 2019-09-11 DIAGNOSIS — I10 Essential (primary) hypertension: Secondary | ICD-10-CM | POA: Diagnosis not present

## 2019-09-11 DIAGNOSIS — F3340 Major depressive disorder, recurrent, in remission, unspecified: Secondary | ICD-10-CM | POA: Diagnosis not present

## 2019-09-11 DIAGNOSIS — Z8673 Personal history of transient ischemic attack (TIA), and cerebral infarction without residual deficits: Secondary | ICD-10-CM | POA: Diagnosis not present

## 2019-09-12 DIAGNOSIS — Z23 Encounter for immunization: Secondary | ICD-10-CM | POA: Diagnosis not present

## 2019-09-23 DIAGNOSIS — X32XXXD Exposure to sunlight, subsequent encounter: Secondary | ICD-10-CM | POA: Diagnosis not present

## 2019-09-23 DIAGNOSIS — L820 Inflamed seborrheic keratosis: Secondary | ICD-10-CM | POA: Diagnosis not present

## 2019-09-23 DIAGNOSIS — L570 Actinic keratosis: Secondary | ICD-10-CM | POA: Diagnosis not present

## 2019-09-23 DIAGNOSIS — Z85828 Personal history of other malignant neoplasm of skin: Secondary | ICD-10-CM | POA: Diagnosis not present

## 2019-09-23 DIAGNOSIS — Z08 Encounter for follow-up examination after completed treatment for malignant neoplasm: Secondary | ICD-10-CM | POA: Diagnosis not present

## 2019-12-19 DIAGNOSIS — Z23 Encounter for immunization: Secondary | ICD-10-CM | POA: Diagnosis not present

## 2019-12-30 DIAGNOSIS — Z79899 Other long term (current) drug therapy: Secondary | ICD-10-CM | POA: Diagnosis not present

## 2019-12-30 DIAGNOSIS — E876 Hypokalemia: Secondary | ICD-10-CM | POA: Diagnosis not present

## 2020-01-09 DIAGNOSIS — E876 Hypokalemia: Secondary | ICD-10-CM | POA: Diagnosis not present

## 2020-01-09 DIAGNOSIS — I10 Essential (primary) hypertension: Secondary | ICD-10-CM | POA: Diagnosis not present

## 2020-01-17 DIAGNOSIS — Z23 Encounter for immunization: Secondary | ICD-10-CM | POA: Diagnosis not present

## 2020-02-04 DIAGNOSIS — Z85828 Personal history of other malignant neoplasm of skin: Secondary | ICD-10-CM | POA: Diagnosis not present

## 2020-02-04 DIAGNOSIS — Z08 Encounter for follow-up examination after completed treatment for malignant neoplasm: Secondary | ICD-10-CM | POA: Diagnosis not present

## 2020-02-04 DIAGNOSIS — H61002 Unspecified perichondritis of left external ear: Secondary | ICD-10-CM | POA: Diagnosis not present

## 2020-02-04 DIAGNOSIS — B078 Other viral warts: Secondary | ICD-10-CM | POA: Diagnosis not present

## 2020-02-25 ENCOUNTER — Ambulatory Visit: Payer: Medicare Other

## 2020-02-25 ENCOUNTER — Other Ambulatory Visit: Payer: Self-pay

## 2020-02-25 ENCOUNTER — Encounter: Payer: Self-pay | Admitting: Orthopedic Surgery

## 2020-02-25 ENCOUNTER — Ambulatory Visit (INDEPENDENT_AMBULATORY_CARE_PROVIDER_SITE_OTHER): Payer: Medicare Other | Admitting: Orthopedic Surgery

## 2020-02-25 VITALS — BP 129/68 | HR 95 | Ht 61.0 in | Wt 120.0 lb

## 2020-02-25 DIAGNOSIS — M47812 Spondylosis without myelopathy or radiculopathy, cervical region: Secondary | ICD-10-CM

## 2020-02-25 DIAGNOSIS — M542 Cervicalgia: Secondary | ICD-10-CM

## 2020-02-25 NOTE — Patient Instructions (Signed)
We will order therapy for the neck   Take any nsaid (advil aleve or diclofenac) for pain   Use heat

## 2020-02-25 NOTE — Progress Notes (Addendum)
Chief Complaint  Patient presents with  . Neck Pain    for a couple weeks     86 year old female presents with a 2 to 3-week history of pain across her neck radiating up into her head with headaches no numbness tingling or radicular symptoms no weakness or gait disturbance  Past Medical History:  Diagnosis Date  . Anxiety   . Arthritis   . Cancer (HCC)    basal cell of scalp  . GERD (gastroesophageal reflux disease)   . HOH (hard of hearing)   . Hypercholesteremia   . Hypertension   . Hypothyroidism   . Stroke ([place]) 2015   mild  . Thyroid disease     Good strength in her hands good strength in the shoulders and arms reflexes are 2+ and equal bilaterally she has some tenderness and stiffness and decreased range of motion in the neck  She had some cervical spondylosis back as far as 2016 last seen for that in April 2016 symptoms seem to have recurred  BP 129/68   Pulse 95   Ht 5\' 1"  (1.549 m)   Wt 120 lb (54.4 kg)   BMI 22.67 kg/m   Imaging shows extensive cervical disease with abnormal contours of the spine in both directions cervical sagittal and coronal  Seems to be neurologically intact though so we will send her to physical therapy  Encounter Diagnoses  Name Primary?  . Neck pain   . Spondylosis without myelopathy or radiculopathy, cervical region Yes   Meds ordered this encounter  Medications  . meloxicam (MOBIC) 7.5 MG tablet    Sig: Take 1 tablet (7.5 mg total) by mouth daily.    Dispense:  30 tablet    Refill:  5

## 2020-02-26 ENCOUNTER — Telehealth: Payer: Self-pay | Admitting: Orthopedic Surgery

## 2020-02-26 NOTE — Telephone Encounter (Signed)
Patient called to relay that Sheila Briggs has not received the "arthritis medication" that Dr Aline Brochure had prescribed; states to please ask them to deliver the prescription.

## 2020-02-27 MED ORDER — MELOXICAM 7.5 MG PO TABS: 7.5000 mg | ORAL_TABLET | Freq: Every day | ORAL | 5 refills | Status: DC

## 2020-02-27 NOTE — Addendum Note (Signed)
Addended by: Arther Abbott E on: 02/27/2020 12:43 PM   Modules accepted: Orders

## 2020-03-03 ENCOUNTER — Other Ambulatory Visit: Payer: Self-pay

## 2020-03-03 ENCOUNTER — Encounter (HOSPITAL_COMMUNITY): Payer: Self-pay | Admitting: Physical Therapy

## 2020-03-03 ENCOUNTER — Ambulatory Visit (HOSPITAL_COMMUNITY): Payer: Medicare Other | Attending: Orthopedic Surgery | Admitting: Physical Therapy

## 2020-03-03 DIAGNOSIS — M542 Cervicalgia: Secondary | ICD-10-CM | POA: Diagnosis not present

## 2020-03-03 DIAGNOSIS — R293 Abnormal posture: Secondary | ICD-10-CM | POA: Diagnosis not present

## 2020-03-03 DIAGNOSIS — M6281 Muscle weakness (generalized): Secondary | ICD-10-CM | POA: Diagnosis not present

## 2020-03-03 NOTE — Therapy (Signed)
[place], [place], [address] Phone: [phone]   Fax:  [phone]  Physical Therapy Evaluation  Patient Details  Name: Sheila Briggs MRN: [id number] Date of Birth: 19-Feb-1932 Referring Provider (PT): Arther Abbott MD   Encounter Date: 03/03/2020  PT End of Session - 03/03/20 1255    Visit Number  1    Number of Visits  8    Date for PT Re-Evaluation  03/31/20    Authorization Type  Primary: Medicare Secondary BCBS supplement (medicare necessity, follows MCR, no auth)    Progress Note Due on Visit  8    PT Start Time  1300    PT Stop Time  1344    PT Time Calculation (min)  44 min    Activity Tolerance  Patient tolerated treatment well    Behavior During Therapy  [facility] for tasks assessed/performed       Past Medical History:  Diagnosis Date  . Anxiety   . Arthritis   . Cancer (HCC)    basal cell of scalp  . GERD (gastroesophageal reflux disease)   . HOH (hard of hearing)   . Hypercholesteremia   . Hypertension   . Hypothyroidism   . Stroke ([place]) 2015   mild  . Thyroid disease     Past Surgical History:  Procedure Laterality Date  . ABDOMINAL HYSTERECTOMY    . APPENDECTOMY    . BIOPSY THYROID     benign  . CATARACT EXTRACTION W/PHACO  12/19/2012   Procedure: CATARACT EXTRACTION PHACO AND INTRAOCULAR LENS PLACEMENT (IOC);  Surgeon: Tonny Branch, MD;  Location: [facility];  Service: Ophthalmology;  Laterality: Left;  CDE 13.75  . CATARACT EXTRACTION W/PHACO Right 01/06/2013   Procedure: CATARACT EXTRACTION PHACO AND INTRAOCULAR LENS PLACEMENT (IOC);  Surgeon: Tonny Branch, MD;  Location: [facility];  Service: Ophthalmology;  Laterality: Right;  CDE: 9.96  . CHOLECYSTECTOMY    . ENDARTERECTOMY Right 01/13/2016   Procedure: ENDARTERECTOMY CAROTID RIGHT;  Surgeon: Angelia Mould, MD;  Location: [place];  Service: Vascular;  Laterality: Right;  . PATCH ANGIOPLASTY  01/13/2016   Procedure: RIGHT CAROTID ARTERY  PATCH ANGIOPLASTY USING 1CM X 6CM XENOSURE BIOLOGIC PATCH;  Surgeon: Angelia Mould, MD;  Location: [place];  Service: Vascular;;  . TONSILLECTOMY      There were no vitals filed for this visit.   Subjective Assessment - 03/03/20 1256    Subjective  Patient is a 86 y.o. female who presents to physical therapy with c/o neck pain. She states she has dull neck pain that is intermittent with intermittent sharp pain up into the back of her head that has been going on for 4-5 weeks with gradual onset over several months. She states long history of back problems. She has pain with turning her head mostly and feels her motion is limited. She has been taking medication for her neck and it seems to be helping with her knees. She has not had neck pain since about 2005. Her main goal is to get rid of the pain and turn her head.    Pertinent History  chronic back pain, HTN, osteoporosis    Limitations  Other (comment)   turning head, driving   Patient Stated Goals  get rid of the pain and turn her head    Currently in Pain?  No/denies   worst 4-5/10        [facility] PT Assessment - 03/03/20 0001  Assessment   Medical Diagnosis  Neck Pain    Referring Provider (PT)  Arther Abbott MD    Onset Date/Surgical Date  01/25/20    Next MD Visit  none scheduled    Prior Therapy  None for neck      Precautions   Precautions  None      Restrictions   Weight Bearing Restrictions  No      Balance Screen   Has the patient fallen in the past 6 months  No    Has the patient had a decrease in activity level because of a fear of falling?   Yes   uses caution   Is the patient reluctant to leave their home because of a fear of falling?   No      Prior Function   Level of Independence  Independent    Vocation  Retired    Leisure  Copy   Overall Cognitive Status  Within Functional Limits for tasks assessed      Observation/Other Assessments   Observations  Ambulates without  AD, forward head in seated, slouched, kyphotic, slight Dowager's Hump    Focus on Therapeutic Outcomes (FOTO)   45% limited      Sensation   Light Touch  Appears Intact      Posture/Postural Control   Posture/Postural Control  Postural limitations    Postural Limitations  Rounded Shoulders;Forward head;Increased thoracic kyphosis      ROM / Strength   AROM / PROM / Strength  AROM;Strength      AROM   Overall AROM   Deficits    Overall AROM Comments  due to pain/"pulling" in posterior cervical spine    AROM Assessment Site  Cervical    Cervical Flexion  25 % limited, pulling    Cervical Extension  25% limted    Cervical - Right Side Bend  75% limited, pain on R    Cervical - Left Side Bend  75% limited, pain on L not as bad    Cervical - Right Rotation  75% limited, pain on R    Cervical - Left Rotation  50% limited, pain on L not as bad      Strength   Overall Strength  Within functional limits for tasks performed    Overall Strength Comments  bilateral UT 5/5    Strength Assessment Site  Shoulder;Elbow;Wrist;Hand    Right/Left Shoulder  Right;Left    Right Shoulder Flexion  5/5    Right Shoulder ABduction  4+/5   pain in UT/ neck   Left Shoulder Flexion  5/5    Left Shoulder ABduction  4+/5   pain in UT/ neck   Right/Left Elbow  Right;Left    Right Elbow Flexion  5/5    Right Elbow Extension  5/5    Left Elbow Flexion  5/5    Right/Left Wrist  Right;Left    Right Wrist Flexion  5/5    Right Wrist Extension  5/5    Left Wrist Flexion  5/5    Left Wrist Extension  5/5    Right/Left hand  Right;Left    Right Hand Gross Grasp  Functional    Left Hand Gross Grasp  Functional      Palpation   Spinal mobility  grossly hypomobile cervical spine    Palpation comment  TTP cervical paraspinals, UT, rhomboids, levator scapulae with R>L pain/tendreness  Objective measurements completed on examination: See above findings.              PT  Education - 03/03/20 1254    Education Details  Patient educated on exam findings, POC, scope of PT, living an active lifestyle.    Person(s) Educated  Patient    Methods  Explanation    Comprehension  Verbalized understanding       PT Short Term Goals - 03/03/20 1454      PT SHORT TERM GOAL #1   Title  Patient will be independent with HEP in order to improve functional outcomes.    Time  2    Period  Weeks    Status  New    Target Date  03/17/20      PT SHORT TERM GOAL #2   Title  Patient will report at least 25% improvement in symptoms for improved quality of life.    Time  2    Period  Weeks    Status  New    Target Date  03/17/20        PT Long Term Goals - 03/03/20 1455      PT LONG TERM GOAL #1   Title  Patient will report at least 75% improvement in symptoms for improved quality of life.    Time  4    Period  Weeks    Status  New    Target Date  03/31/20      PT LONG TERM GOAL #2   Title  Patient will improve FOTO score by at least 5 points in order to indicate improved tolerance to activity.    Time  4    Period  Weeks    Status  New    Target Date  03/31/20      PT LONG TERM GOAL #3   Title  Patient will demonstrate at least 25% improvement in cervical ROM in all planes for improved ability to move head while driving    Time  4    Period  Weeks    Status  New    Target Date  03/31/20      PT LONG TERM GOAL #4   Title  Patient will be able to return to gardening with pain no greater than 1/10 for ability to participate in leisure activities.    Time  4    Period  Weeks    Status  New    Target Date  03/31/20             Plan - 03/03/20 1443    Clinical Impression Statement  Patient is a 86 y.o. female who presents to physical therapy with c/o neck pain and limited mobility. She presents with pain limited deficits in cervical strength, ROM, endurance, postural impairments, spinal mobility and functional mobility with ADL. She is having to  modify and restrict ADL as indicated by FOTO score as well as subjective information and objective measures which is affecting overall participation. Patient will benefit from skilled physical therapy in order to improve function and reduce impairment.    Personal Factors and Comorbidities  Age;Behavior Pattern;Comorbidity 3+;Past/Current Experience    Comorbidities  chronic back pain, HTN, osteoporosis    Examination-Activity Limitations  Bend;Lift;Other   turning   Examination-Participation Restrictions  Driving;Cleaning;Shop;Yard Work    Stability/Clinical Decision Making  Stable/Uncomplicated    Clinical Decision Making  Low    Rehab Potential  Fair    PT Frequency  2x /  week    PT Duration  4 weeks    PT Treatment/Interventions  ADLs/Self Care Home Management;Aquatic Therapy;Biofeedback;Canalith Repostioning;Cryotherapy;Electrical Stimulation;Iontophoresis 4mg /ml Dexamethasone;Moist Heat;Traction;Ultrasound;DME Instruction;Gait training;Stair training;Functional mobility training;Therapeutic activities;Therapeutic exercise;Balance training;Neuromuscular re-education;Patient/family education;Manual techniques;Manual lymph drainage;Compression bandaging;Scar mobilization;Passive range of motion;Dry needling;Energy conservation;Spinal Manipulations;Joint Manipulations    PT Next Visit Plan  trial repeated cervical retraction in supine or seated, possibly begin manual therapy for pain reduction and improved mobility, begin postural strengtheing exercises    PT Home Exercise Plan  Initiate next session    Consulted and Agree with Plan of Care  Patient       Patient will benefit from skilled therapeutic intervention in order to improve the following deficits and impairments:  Decreased activity tolerance, Decreased balance, Decreased endurance, Decreased mobility, Decreased range of motion, Decreased strength, Hypomobility, Increased muscle spasms, Impaired flexibility, Improper body mechanics,  Postural dysfunction, Pain  Visit Diagnosis: Cervicalgia  Abnormal posture  Muscle weakness (generalized)     Problem List Patient Active Problem List   Diagnosis Date Noted  . Carotid stenosis 01/13/2016  . CVA (cerebral infarction) 10/13/2014  . Arthritis of knee, degenerative 01/29/2014  . Hypothyroid 06/10/2012  . Hypertension   . 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 KNEE PAIN 03/15/2009  . IMPINGEMENT SYNDROME 03/11/2008  . SHOULDER PAIN 10/16/2007  . HIGH BLOOD PRESSURE 10/16/2007    2:59 PM, 03/03/20 Mearl Latin PT, DPT Physical Therapist at [place], [place], [address] Phone: [phone]   Fax:  [phone]  Name: Sheila Briggs MRN: [id number] Date of Birth: 11/28/1931

## 2020-03-08 ENCOUNTER — Ambulatory Visit (HOSPITAL_COMMUNITY): Payer: Medicare Other | Admitting: Physical Therapy

## 2020-03-08 ENCOUNTER — Other Ambulatory Visit: Payer: Self-pay

## 2020-03-08 DIAGNOSIS — M542 Cervicalgia: Secondary | ICD-10-CM

## 2020-03-08 DIAGNOSIS — R293 Abnormal posture: Secondary | ICD-10-CM | POA: Diagnosis not present

## 2020-03-08 DIAGNOSIS — M6281 Muscle weakness (generalized): Secondary | ICD-10-CM

## 2020-03-08 NOTE — Patient Instructions (Signed)
Scapular Retraction: Abduction    With arms elevated and elbows bent to 90, pinch shoulder blades together and press arms back. Repeat _10___ times per set. Do __1__ sets per session. Do _2___ sessions per day.   Neck: Retraction    Sit with back and head straight. Pull chin back to line up ear with shoulder. Do not turn or tilt head. Hold __5__ seconds. Repeat __10__ times. Do __2__ sessions per day. CAUTION: Movement should be gentle, steady and slow.

## 2020-03-08 NOTE — Therapy (Signed)
[place], [place], [address] Phone: [phone]   Fax:  [phone]  Physical Therapy Treatment  Patient Details  Name: Sheila Briggs MRN: [id number] Date of Birth: 05/03/1932 Referring Provider (PT): Arther Abbott MD   Encounter Date: 03/08/2020  PT End of Session - 03/08/20 1358    Visit Number  2    Number of Visits  8    Date for PT Re-Evaluation  03/31/20    Authorization Type  Primary: Medicare Secondary BCBS supplement (medicare necessity, follows MCR, no auth)    Progress Note Due on Visit  8    PT Start Time  1318    PT Stop Time  1352    PT Time Calculation (min)  34 min    Activity Tolerance  Patient tolerated treatment well    Behavior During Therapy  [facility] for tasks assessed/performed       Past Medical History:  Diagnosis Date  . Anxiety   . Arthritis   . Cancer (HCC)    basal cell of scalp  . GERD (gastroesophageal reflux disease)   . HOH (hard of hearing)   . Hypercholesteremia   . Hypertension   . Hypothyroidism   . Stroke ([place]) 2015   mild  . Thyroid disease     Past Surgical History:  Procedure Laterality Date  . ABDOMINAL HYSTERECTOMY    . APPENDECTOMY    . BIOPSY THYROID     benign  . CATARACT EXTRACTION W/PHACO  12/19/2012   Procedure: CATARACT EXTRACTION PHACO AND INTRAOCULAR LENS PLACEMENT (IOC);  Surgeon: Tonny Branch, MD;  Location: [facility];  Service: Ophthalmology;  Laterality: Left;  CDE 13.75  . CATARACT EXTRACTION W/PHACO Right 01/06/2013   Procedure: CATARACT EXTRACTION PHACO AND INTRAOCULAR LENS PLACEMENT (IOC);  Surgeon: Tonny Branch, MD;  Location: [facility];  Service: Ophthalmology;  Laterality: Right;  CDE: 9.96  . CHOLECYSTECTOMY    . ENDARTERECTOMY Right 01/13/2016   Procedure: ENDARTERECTOMY CAROTID RIGHT;  Surgeon: Angelia Mould, MD;  Location: [place];  Service: Vascular;  Laterality: Right;  . PATCH ANGIOPLASTY  01/13/2016   Procedure: RIGHT CAROTID ARTERY  PATCH ANGIOPLASTY USING 1CM X 6CM XENOSURE BIOLOGIC PATCH;  Surgeon: Angelia Mould, MD;  Location: [place];  Service: Vascular;;  . TONSILLECTOMY      There were no vitals filed for this visit.  Subjective Assessment - 03/08/20 1320    Subjective  pt states her pain is about a 4/10.                       [facility] PT Treatment/Exercise - 03/08/20 0001      Neck Exercises: Seated   Neck Retraction  10 reps    W Back  10 reps    Other Seated Exercise  cervical excursions 5 reps each way      Manual Therapy   Manual Therapy  Soft tissue mobilization    Manual therapy comments  completed seperately from all other skilled interventions    Soft tissue mobilization  seated to cervical, UT regions             PT Education - 03/08/20 1359    Education Details  reveiwed goals and POC moving forward.  Initiated HEP    Person(s) Educated  Patient    Methods  Explanation;Demonstration;Tactile cues;Verbal cues;Handout    Comprehension  Verbalized understanding;Returned demonstration;Verbal cues required;Tactile cues required  PT Short Term Goals - 03/08/20 1401      PT SHORT TERM GOAL #1   Title  Patient will be independent with HEP in order to improve functional outcomes.    Time  2    Period  Weeks    Status  On-going    Target Date  03/17/20      PT SHORT TERM GOAL #2   Title  Patient will report at least 25% improvement in symptoms for improved quality of life.    Time  2    Period  Weeks    Status  On-going    Target Date  03/17/20        PT Long Term Goals - 03/08/20 1401      PT LONG TERM GOAL #1   Title  Patient will report at least 75% improvement in symptoms for improved quality of life.    Time  4    Period  Weeks    Status  On-going      PT LONG TERM GOAL #2   Title  Patient will improve FOTO score by at least 5 points in order to indicate improved tolerance to activity.    Time  4    Period  Weeks    Status  On-going       PT LONG TERM GOAL #3   Title  Patient will demonstrate at least 25% improvement in cervical ROM in all planes for improved ability to move head while driving    Time  4    Period  Weeks    Status  On-going      PT LONG TERM GOAL #4   Title  Patient will be able to return to gardening with pain no greater than 1/10 for ability to participate in leisure activities.    Time  4    Period  Weeks    Status  On-going            Plan - 03/08/20 1401    Clinical Impression Statement  Goals and POC moving forward discussed.  Initiated therex today and given HEP.  PT required cues to complete in correct form and without substitituion.  PT only with discomfort with cervical rotation, no other issues noted.  manual completed at EOS to bilateral upper traps and cervical mm.  Pt with general tightness bilaterally but without spasms.    Personal Factors and Comorbidities  Age;Behavior Pattern;Comorbidity 3+;Past/Current Experience    Comorbidities  chronic back pain, HTN, osteoporosis    Examination-Activity Limitations  Bend;Lift;Other   turning   Examination-Participation Restrictions  Driving;Cleaning;Shop;Yard Work    Stability/Clinical Decision Making  Stable/Uncomplicated    Rehab Potential  Fair    PT Frequency  2x / week    PT Duration  4 weeks    PT Treatment/Interventions  ADLs/Self Care Home Management;Aquatic Therapy;Biofeedback;Canalith Repostioning;Cryotherapy;Electrical Stimulation;Iontophoresis 4mg /ml Dexamethasone;Moist Heat;Traction;Ultrasound;DME Instruction;Gait training;Stair training;Functional mobility training;Therapeutic activities;Therapeutic exercise;Balance training;Neuromuscular re-education;Patient/family education;Manual techniques;Manual lymph drainage;Compression bandaging;Scar mobilization;Passive range of motion;Dry needling;Energy conservation;Spinal Manipulations;Joint Manipulations    PT Next Visit Plan  Progress postural strengthening and continue with manual  as needed for pain and spasm reduction.    PT Home Exercise Plan  4/26:: cervical retractions, scapular retractions, cervical excursions    Consulted and Agree with Plan of Care  Patient       Patient will benefit from skilled therapeutic intervention in order to improve the following deficits and impairments:  Decreased activity tolerance, Decreased balance, Decreased endurance, Decreased mobility, Decreased  range of motion, Decreased strength, Hypomobility, Increased muscle spasms, Impaired flexibility, Improper body mechanics, Postural dysfunction, Pain  Visit Diagnosis: Abnormal posture  Muscle weakness (generalized)  Cervicalgia     Problem List Patient Active Problem List   Diagnosis Date Noted  . Carotid stenosis 01/13/2016  . CVA (cerebral infarction) 10/13/2014  . Arthritis of knee, degenerative 01/29/2014  . Hypothyroid 06/10/2012  . Hypertension   . 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 KNEE PAIN 03/15/2009  . IMPINGEMENT SYNDROME 03/11/2008  . SHOULDER PAIN 10/16/2007  . HIGH BLOOD PRESSURE 10/16/2007   Teena Irani, PTA/CLT [phone]  Teena Irani 03/08/2020, 2:06 PM  [place] [address] [place], [place], [address] Phone: [phone]   Fax:  [phone]  Name: Sheila Briggs MRN: [id number] Date of Birth: Jun 12, 1932

## 2020-03-11 ENCOUNTER — Ambulatory Visit (HOSPITAL_COMMUNITY): Payer: Medicare Other | Admitting: Physical Therapy

## 2020-03-11 ENCOUNTER — Other Ambulatory Visit: Payer: Self-pay

## 2020-03-11 DIAGNOSIS — R293 Abnormal posture: Secondary | ICD-10-CM

## 2020-03-11 DIAGNOSIS — M6281 Muscle weakness (generalized): Secondary | ICD-10-CM

## 2020-03-11 DIAGNOSIS — M542 Cervicalgia: Secondary | ICD-10-CM | POA: Diagnosis not present

## 2020-03-11 NOTE — Therapy (Signed)
[place], [place], [address] Phone: [phone]   Fax:  [phone]  Physical Therapy Treatment  Patient Details  Name: Sheila Briggs MRN: [id number] Date of Birth: 10-Oct-1932 Referring Provider (PT): Arther Abbott MD   Encounter Date: 03/11/2020  PT End of Session - 03/11/20 1403    Visit Number  3    Number of Visits  8    Date for PT Re-Evaluation  03/31/20    Authorization Type  Primary: Medicare Secondary BCBS supplement (medicare necessity, follows MCR, no auth)    Progress Note Due on Visit  8    PT Start Time  1317    PT Stop Time  1355    PT Time Calculation (min)  38 min    Activity Tolerance  Patient tolerated treatment well    Behavior During Therapy  [facility] for tasks assessed/performed       Past Medical History:  Diagnosis Date  . Anxiety   . Arthritis   . Cancer (HCC)    basal cell of scalp  . GERD (gastroesophageal reflux disease)   . HOH (hard of hearing)   . Hypercholesteremia   . Hypertension   . Hypothyroidism   . Stroke ([place]) 2015   mild  . Thyroid disease     Past Surgical History:  Procedure Laterality Date  . ABDOMINAL HYSTERECTOMY    . APPENDECTOMY    . BIOPSY THYROID     benign  . CATARACT EXTRACTION W/PHACO  12/19/2012   Procedure: CATARACT EXTRACTION PHACO AND INTRAOCULAR LENS PLACEMENT (IOC);  Surgeon: Tonny Branch, MD;  Location: [facility];  Service: Ophthalmology;  Laterality: Left;  CDE 13.75  . CATARACT EXTRACTION W/PHACO Right 01/06/2013   Procedure: CATARACT EXTRACTION PHACO AND INTRAOCULAR LENS PLACEMENT (IOC);  Surgeon: Tonny Branch, MD;  Location: [facility];  Service: Ophthalmology;  Laterality: Right;  CDE: 9.96  . CHOLECYSTECTOMY    . ENDARTERECTOMY Right 01/13/2016   Procedure: ENDARTERECTOMY CAROTID RIGHT;  Surgeon: Angelia Mould, MD;  Location: [place];  Service: Vascular;  Laterality: Right;  . PATCH ANGIOPLASTY  01/13/2016   Procedure: RIGHT CAROTID ARTERY  PATCH ANGIOPLASTY USING 1CM X 6CM XENOSURE BIOLOGIC PATCH;  Surgeon: Angelia Mould, MD;  Location: [facility];  Service: Vascular;;  . TONSILLECTOMY      There were no vitals filed for this visit.  Subjective Assessment - 03/11/20 1324    Subjective  pt states she can tell it's getting better.    Currently in Pain?  Yes    Pain Score  4     Pain Location  Neck    Pain Orientation  Right;Left                       [facility] Adult PT Treatment/Exercise - 03/11/20 0001      Neck Exercises: Standing   Upper Extremity Flexion with Stabilization  10 reps    UE Flexion with Stabilization Limitations  against wall      Neck Exercises: Seated   Neck Retraction  10 secs    W Back  10 reps    Other Seated Exercise  cervical excursions 5 reps each way      Manual Therapy   Manual Therapy  Soft tissue mobilization    Manual therapy comments  completed seperately from all other skilled interventions    Soft tissue mobilization  seated to cervical, UT regions  Neck Exercises: Stretches   Upper Trapezius Stretch  2 reps;30 seconds;Limitations   seated              PT Short Term Goals - 03/08/20 1401      PT SHORT TERM GOAL #1   Title  Patient will be independent with HEP in order to improve functional outcomes.    Time  2    Period  Weeks    Status  On-going    Target Date  03/17/20      PT SHORT TERM GOAL #2   Title  Patient will report at least 25% improvement in symptoms for improved quality of life.    Time  2    Period  Weeks    Status  On-going    Target Date  03/17/20        PT Long Term Goals - 03/08/20 1401      PT LONG TERM GOAL #1   Title  Patient will report at least 75% improvement in symptoms for improved quality of life.    Time  4    Period  Weeks    Status  On-going      PT LONG TERM GOAL #2   Title  Patient will improve FOTO score by at least 5 points in order to indicate improved tolerance to activity.    Time  4     Period  Weeks    Status  On-going      PT LONG TERM GOAL #3   Title  Patient will demonstrate at least 25% improvement in cervical ROM in all planes for improved ability to move head while driving    Time  4    Period  Weeks    Status  On-going      PT LONG TERM GOAL #4   Title  Patient will be able to return to gardening with pain no greater than 1/10 for ability to participate in leisure activities.    Time  4    Period  Weeks    Status  On-going            Plan - 03/11/20 1403    Clinical Impression Statement  Continued with established therex with manual completed at end of session to reduce spasm/pain.  pt with improved cervical mobility, however constant cues to sit upright and not lean back with seated therex.  Instructed wtih UE flexion against wall and also added upper trap stretch to POC.  Large spasms in Lt UT and multiple acround Rt scap region.  All reduced with manual.    Personal Factors and Comorbidities  Age;Behavior Pattern;Comorbidity 3+;Past/Current Experience    Comorbidities  chronic back pain, HTN, osteoporosis    Examination-Activity Limitations  Bend;Lift;Other   turning   Examination-Participation Restrictions  Driving;Cleaning;Shop;Yard Work    Stability/Clinical Decision Making  Stable/Uncomplicated    Rehab Potential  Fair    PT Frequency  2x / week    PT Duration  4 weeks    PT Treatment/Interventions  ADLs/Self Care Home Management;Aquatic Therapy;Biofeedback;Canalith Repostioning;Cryotherapy;Electrical Stimulation;Iontophoresis 4mg /ml Dexamethasone;Moist Heat;Traction;Ultrasound;DME Instruction;Gait training;Stair training;Functional mobility training;Therapeutic activities;Therapeutic exercise;Balance training;Neuromuscular re-education;Patient/family education;Manual techniques;Manual lymph drainage;Compression bandaging;Scar mobilization;Passive range of motion;Dry needling;Energy conservation;Spinal Manipulations;Joint Manipulations    PT Next  Visit Plan  Progress postural strengthening and continue with manual as needed for pain and spasm reduction.  Add postural theraband strengthening next session.    PT Home Exercise Plan  4/26:: cervical retractions, scapular retractions, cervical excursions  Consulted and Agree with Plan of Care  Patient       Patient will benefit from skilled therapeutic intervention in order to improve the following deficits and impairments:  Decreased activity tolerance, Decreased balance, Decreased endurance, Decreased mobility, Decreased range of motion, Decreased strength, Hypomobility, Increased muscle spasms, Impaired flexibility, Improper body mechanics, Postural dysfunction, Pain  Visit Diagnosis: Abnormal posture  Muscle weakness (generalized)  Cervicalgia     Problem List Patient Active Problem List   Diagnosis Date Noted  . Carotid stenosis 01/13/2016  . CVA (cerebral infarction) 10/13/2014  . Arthritis of knee, degenerative 01/29/2014  . Hypothyroid 06/10/2012  . Hypertension   . 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 KNEE PAIN 03/15/2009  . IMPINGEMENT SYNDROME 03/11/2008  . SHOULDER PAIN 10/16/2007  . HIGH BLOOD PRESSURE 10/16/2007   Teena Irani, PTA/CLT [phone]  Teena Irani 03/11/2020, 2:07 PM  [facility] [address] [place], [place], [address] Phone: [phone]   Fax:  [phone]  Name: Sheila Briggs MRN: [id number] Date of Birth: 08-27-1932

## 2020-03-15 ENCOUNTER — Other Ambulatory Visit: Payer: Self-pay

## 2020-03-15 ENCOUNTER — Ambulatory Visit (HOSPITAL_COMMUNITY): Payer: Medicare Other | Attending: Orthopedic Surgery | Admitting: Physical Therapy

## 2020-03-15 ENCOUNTER — Encounter (HOSPITAL_COMMUNITY): Payer: Self-pay | Admitting: Physical Therapy

## 2020-03-15 DIAGNOSIS — M6281 Muscle weakness (generalized): Secondary | ICD-10-CM | POA: Diagnosis not present

## 2020-03-15 DIAGNOSIS — R293 Abnormal posture: Secondary | ICD-10-CM | POA: Insufficient documentation

## 2020-03-15 DIAGNOSIS — M542 Cervicalgia: Secondary | ICD-10-CM

## 2020-03-15 NOTE — Therapy (Signed)
[place], [place], [address] Phone: [phone]   Fax:  [phone]  Physical Therapy Treatment  Patient Details  Name: Sheila Briggs MRN: [id number] Date of Birth: 1932-02-22 Referring Provider (PT): Arther Abbott MD   Encounter Date: 03/15/2020  PT End of Session - 03/15/20 1504    Visit Number  4    Number of Visits  8    Date for PT Re-Evaluation  03/31/20    Authorization Type  Primary: Medicare Secondary BCBS supplement (medicare necessity, follows MCR, no auth)    Progress Note Due on Visit  8    PT Start Time  1437    PT Stop Time  1515    PT Time Calculation (min)  38 min    Activity Tolerance  Patient tolerated treatment well    Behavior During Therapy  [facility] for tasks assessed/performed       Past Medical History:  Diagnosis Date  . Anxiety   . Arthritis   . Cancer (HCC)    basal cell of scalp  . GERD (gastroesophageal reflux disease)   . HOH (hard of hearing)   . Hypercholesteremia   . Hypertension   . Hypothyroidism   . Stroke ([place]) 2015   mild  . Thyroid disease     Past Surgical History:  Procedure Laterality Date  . ABDOMINAL HYSTERECTOMY    . APPENDECTOMY    . BIOPSY THYROID     benign  . CATARACT EXTRACTION W/PHACO  12/19/2012   Procedure: CATARACT EXTRACTION PHACO AND INTRAOCULAR LENS PLACEMENT (IOC);  Surgeon: Tonny Branch, MD;  Location: [facility];  Service: Ophthalmology;  Laterality: Left;  CDE 13.75  . CATARACT EXTRACTION W/PHACO Right 01/06/2013   Procedure: CATARACT EXTRACTION PHACO AND INTRAOCULAR LENS PLACEMENT (IOC);  Surgeon: Tonny Branch, MD;  Location: [facility];  Service: Ophthalmology;  Laterality: Right;  CDE: 9.96  . CHOLECYSTECTOMY    . ENDARTERECTOMY Right 01/13/2016   Procedure: ENDARTERECTOMY CAROTID RIGHT;  Surgeon: Angelia Mould, MD;  Location: [place];  Service: Vascular;  Laterality: Right;  . PATCH ANGIOPLASTY  01/13/2016   Procedure: RIGHT CAROTID ARTERY  PATCH ANGIOPLASTY USING 1CM X 6CM XENOSURE BIOLOGIC PATCH;  Surgeon: Angelia Mould, MD;  Location: [place];  Service: Vascular;;  . TONSILLECTOMY      There were no vitals filed for this visit.  Subjective Assessment - 03/15/20 1434    Subjective  Patient reports her neck is doing better. She feels like she is turning better. Her home exercises are going well. She feels about 50% better since starting therapy.    Currently in Pain?  Yes    Pain Score  4     Pain Location  Neck                       [facility] Adult PT Treatment/Exercise - 03/15/20 0001      Neck Exercises: Theraband   Shoulder Extension  10 reps;Red    Shoulder Extension Limitations  2 sets    Rows  15 reps;Red    Rows Limitations  2 sets      Neck Exercises: Seated   Neck Retraction  10 reps    Neck Retraction Limitations  2 sets    W Back  10 reps    W Back Limitations  2 sets     Other Seated Exercise  t/sp extension over chair      Manual  Therapy   Manual Therapy  Soft tissue mobilization;Joint mobilization    Manual therapy comments  completed seperately from all other skilled interventions    Joint Mobilization  R UPA C2-C6    Soft tissue mobilization  supine to cervical, UT regions      Neck Exercises: Stretches   Upper Trapezius Stretch  2 reps;30 seconds;Limitations   seated            PT Education - 03/15/20 1439    Education Details  Review of HEP, continuing HEP    Person(s) Educated  Patient    Methods  Explanation;Demonstration    Comprehension  Verbalized understanding;Returned demonstration       PT Short Term Goals - 03/15/20 1506      PT SHORT TERM GOAL #1   Title  Patient will be independent with HEP in order to improve functional outcomes.    Time  2    Period  Weeks    Status  Achieved    Target Date  03/17/20      PT SHORT TERM GOAL #2   Title  Patient will report at least 25% improvement in symptoms for improved quality of life.    Time  2     Period  Weeks    Status  Achieved    Target Date  03/17/20        PT Long Term Goals - 03/08/20 1401      PT LONG TERM GOAL #1   Title  Patient will report at least 75% improvement in symptoms for improved quality of life.    Time  4    Period  Weeks    Status  On-going      PT LONG TERM GOAL #2   Title  Patient will improve FOTO score by at least 5 points in order to indicate improved tolerance to activity.    Time  4    Period  Weeks    Status  On-going      PT LONG TERM GOAL #3   Title  Patient will demonstrate at least 25% improvement in cervical ROM in all planes for improved ability to move head while driving    Time  4    Period  Weeks    Status  On-going      PT LONG TERM GOAL #4   Title  Patient will be able to return to gardening with pain no greater than 1/10 for ability to participate in leisure activities.    Time  4    Period  Weeks    Status  On-going            Plan - 03/15/20 1504    Clinical Impression Statement  Patient is tender and hypomobile throughout cervical spine with greatest tenderness on R side. Patient tolerates manual therapy well with decrease in symptoms and improving cervical ROM. She tolerates postural strengthening with band well and has no increase in symptoms following. She does require min/mod verbal cueing for positioning and mechanics of exercise. Patient will continue to benefit from skilled physical therapy in order to reduce impairment and improve function.    Personal Factors and Comorbidities  Age;Behavior Pattern;Comorbidity 3+;Past/Current Experience    Comorbidities  chronic back pain, HTN, osteoporosis    Examination-Activity Limitations  Bend;Lift;Other   turning   Examination-Participation Restrictions  Driving;Cleaning;Shop;Yard Work    Stability/Clinical Decision Making  Stable/Uncomplicated    Rehab Potential  Fair    PT Frequency  2x /  week    PT Duration  4 weeks    PT Treatment/Interventions  ADLs/Self Care  Home Management;Aquatic Therapy;Biofeedback;Canalith Repostioning;Cryotherapy;Electrical Stimulation;Iontophoresis 4mg /ml Dexamethasone;Moist Heat;Traction;Ultrasound;DME Instruction;Gait training;Stair training;Functional mobility training;Therapeutic activities;Therapeutic exercise;Balance training;Neuromuscular re-education;Patient/family education;Manual techniques;Manual lymph drainage;Compression bandaging;Scar mobilization;Passive range of motion;Dry needling;Energy conservation;Spinal Manipulations;Joint Manipulations    PT Next Visit Plan  Progress postural strengthening and continue with manual as needed for pain and spasm reduction. continue postural theraband strengthening next session.    PT Home Exercise Plan  4/26:: cervical retractions, scapular retractions, cervical excursions 5/3 upper trap stretch    Consulted and Agree with Plan of Care  Patient       Patient will benefit from skilled therapeutic intervention in order to improve the following deficits and impairments:  Decreased activity tolerance, Decreased balance, Decreased endurance, Decreased mobility, Decreased range of motion, Decreased strength, Hypomobility, Increased muscle spasms, Impaired flexibility, Improper body mechanics, Postural dysfunction, Pain  Visit Diagnosis: Cervicalgia  Muscle weakness (generalized)  Abnormal posture     Problem List Patient Active Problem List   Diagnosis Date Noted  . Carotid stenosis 01/13/2016  . CVA (cerebral infarction) 10/13/2014  . Arthritis of knee, degenerative 01/29/2014  . Hypothyroid 06/10/2012  . Hypertension   . 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 KNEE PAIN 03/15/2009  . IMPINGEMENT SYNDROME 03/11/2008  . SHOULDER PAIN 10/16/2007  . HIGH BLOOD PRESSURE 10/16/2007    Vianne Bulls Manuel Dall 03/15/2020, 3:16 PM  [facility] [address] [place], [place], [address] Phone: [phone]   Fax:  [phone]  Name:  ALLANA LATENDRESSE MRN: [id number] Date of Birth: [birth year]

## 2020-03-15 NOTE — Patient Instructions (Signed)
Access Code: [email or web address] URL: [email or web address] Date: 03/15/2020 Prepared by: Mitzi Hansen Seraj Dunnam  Exercises Seated Upper Trapezius Stretch - 2 x daily - 7 x weekly - 3 reps - 30 second holds hold

## 2020-03-17 ENCOUNTER — Ambulatory Visit (HOSPITAL_COMMUNITY): Payer: Medicare Other | Admitting: Physical Therapy

## 2020-03-17 ENCOUNTER — Other Ambulatory Visit: Payer: Self-pay

## 2020-03-17 DIAGNOSIS — R293 Abnormal posture: Secondary | ICD-10-CM

## 2020-03-17 DIAGNOSIS — M542 Cervicalgia: Secondary | ICD-10-CM

## 2020-03-17 DIAGNOSIS — M6281 Muscle weakness (generalized): Secondary | ICD-10-CM

## 2020-03-17 NOTE — Therapy (Signed)
[facility] [place], [place], [address] Phone: [phone]   Fax:  [phone]  Physical Therapy Treatment  Patient Details  Name: Sheila Briggs MRN: [id number] Date of Birth: [birth year] Referring Provider (PT): Arther Abbott MD   Encounter Date: 03/17/2020  PT End of Session - 03/17/20 1506    Visit Number  5    Number of Visits  8    Date for PT Re-Evaluation  03/31/20    Authorization Type  Primary: Medicare Secondary BCBS supplement (medicare necessity, follows MCR, no auth)    Progress Note Due on Visit  8    PT Start Time  1435    PT Stop Time  1515    PT Time Calculation (min)  40 min    Activity Tolerance  Patient tolerated treatment well    Behavior During Therapy  [facility] for tasks assessed/performed       Past Medical History:  Diagnosis Date  . Anxiety   . Arthritis   . Cancer (HCC)    basal cell of scalp  . GERD (gastroesophageal reflux disease)   . HOH (hard of hearing)   . Hypercholesteremia   . Hypertension   . Hypothyroidism   . Stroke ([place]) 2015   mild  . Thyroid disease     Past Surgical History:  Procedure Laterality Date  . ABDOMINAL HYSTERECTOMY    . APPENDECTOMY    . BIOPSY THYROID     benign  . CATARACT EXTRACTION W/PHACO  12/19/2012   Procedure: CATARACT EXTRACTION PHACO AND INTRAOCULAR LENS PLACEMENT (IOC);  Surgeon: Tonny Branch, MD;  [facility];  Service: Ophthalmology;  Laterality: Left;  CDE 13.75  . CATARACT EXTRACTION W/PHACO Right 01/06/2013   Procedure: CATARACT EXTRACTION PHACO AND INTRAOCULAR LENS PLACEMENT (IOC);  Surgeon: Tonny Branch, MD;  Location: [facility];  Service: Ophthalmology;  Laterality: Right;  CDE: 9.96  . CHOLECYSTECTOMY    . ENDARTERECTOMY Right 01/13/2016   Procedure: ENDARTERECTOMY CAROTID RIGHT;  Surgeon: Angelia Mould, MD;  Location: [facility];  Service: Vascular;  Laterality: Right;  . PATCH ANGIOPLASTY  01/13/2016   Procedure: RIGHT CAROTID ARTERY  PATCH ANGIOPLASTY USING 1CM X 6CM XENOSURE BIOLOGIC PATCH;  Surgeon: Angelia Mould, MD;  Location: [place];  Service: Vascular;;  . TONSILLECTOMY      There were no vitals filed for this visit.  Subjective Assessment - 03/17/20 1433    Subjective  Patient states she was a little tired after last session. Her neck pain is getting much better. She is still having pain and stiffness on R side of neck.    Patient Stated Goals  get rid of the pain and turn her head    Currently in Pain?  Yes    Pain Score  3     Pain Location  Neck                       [facility] Adult PT Treatment/Exercise - 03/17/20 0001      Neck Exercises: Standing   Upper Extremity Flexion with Stabilization  10 reps    UE Flexion with Stabilization Limitations  against wall with c/sp retraction and red band in hands 2x10    Other Standing Exercises  shoulder ER with scap retraction with band in hands 2x10      Neck Exercises: Seated   Neck Retraction  10 reps    Neck Retraction Limitations  2 sets  Other Seated Exercise  SNAG for R rotation 1x10 with towel, 1x10 with blue strap      Manual Therapy   Manual Therapy  Soft tissue mobilization;Joint mobilization    Manual therapy comments  completed seperately from all other skilled interventions    Joint Mobilization  R UPA C2-C6    Soft tissue mobilization  supine to cervical, UT regions             PT Education - 03/17/20 1437    Education Details  Review of HEP, continuing HEP    Person(s) Educated  Patient    Methods  Explanation;Demonstration    Comprehension  Verbalized understanding;Returned demonstration       PT Short Term Goals - 03/15/20 1506      PT SHORT TERM GOAL #1   Title  Patient will be independent with HEP in order to improve functional outcomes.    Time  2    Period  Weeks    Status  Achieved    Target Date  03/17/20      PT SHORT TERM GOAL #2   Title  Patient will report at least 25% improvement in  symptoms for improved quality of life.    Time  2    Period  Weeks    Status  Achieved    Target Date  03/17/20        PT Long Term Goals - 03/08/20 1401      PT LONG TERM GOAL #1   Title  Patient will report at least 75% improvement in symptoms for improved quality of life.    Time  4    Period  Weeks    Status  On-going      PT LONG TERM GOAL #2   Title  Patient will improve FOTO score by at least 5 points in order to indicate improved tolerance to activity.    Time  4    Period  Weeks    Status  On-going      PT LONG TERM GOAL #3   Title  Patient will demonstrate at least 25% improvement in cervical ROM in all planes for improved ability to move head while driving    Time  4    Period  Weeks    Status  On-going      PT LONG TERM GOAL #4   Title  Patient will be able to return to gardening with pain no greater than 1/10 for ability to participate in leisure activities.    Time  4    Period  Weeks    Status  On-going            Plan - 03/17/20 1507    Clinical Impression Statement  Patient experiences minimal change in pain and stiffness with mobilizations despite decrease in tissue tension. She requires extensive cueing for R rotation with UE movement with SNAG exercise. She has difficulty completing with towel but shows improving mechanics with strap. She requires frequent demonstration and cueing in order to complete shoulder ER with scap retraction with band secondary to compensatory strategies due to weakness. Patient will continue to benefit from skilled physical therapy in order to improve function and reduce impairment.    Personal Factors and Comorbidities  Age;Behavior Pattern;Comorbidity 3+;Past/Current Experience    Comorbidities  chronic back pain, HTN, osteoporosis    Examination-Activity Limitations  Bend;Lift;Other   turning   Examination-Participation Restrictions  Driving;Cleaning;Shop;Yard Work    Stability/Clinical Decision Making   Stable/Uncomplicated  Rehab Potential  Fair    PT Frequency  2x / week    PT Duration  4 weeks    PT Treatment/Interventions  ADLs/Self Care Home Management;Aquatic Therapy;Biofeedback;Canalith Repostioning;Cryotherapy;Electrical Stimulation;Iontophoresis 4mg /ml Dexamethasone;Moist Heat;Traction;Ultrasound;DME Instruction;Gait training;Stair training;Functional mobility training;Therapeutic activities;Therapeutic exercise;Balance training;Neuromuscular re-education;Patient/family education;Manual techniques;Manual lymph drainage;Compression bandaging;Scar mobilization;Passive range of motion;Dry needling;Energy conservation;Spinal Manipulations;Joint Manipulations    PT Next Visit Plan  Progress postural strengthening and continue with manual as needed for pain and spasm reduction. continue postural theraband strengthening next session.    PT Home Exercise Plan  4/26:: cervical retractions, scapular retractions, cervical excursions 5/3 upper trap stretch    Consulted and Agree with Plan of Care  Patient       Patient will benefit from skilled therapeutic intervention in order to improve the following deficits and impairments:  Decreased activity tolerance, Decreased balance, Decreased endurance, Decreased mobility, Decreased range of motion, Decreased strength, Hypomobility, Increased muscle spasms, Impaired flexibility, Improper body mechanics, Postural dysfunction, Pain  Visit Diagnosis: Cervicalgia  Muscle weakness (generalized)  Abnormal posture     Problem List Patient Active Problem List   Diagnosis Date Noted  . Carotid stenosis 01/13/2016  . CVA (cerebral infarction) 10/13/2014  . Arthritis of knee, degenerative 01/29/2014  . Hypothyroid 06/10/2012  . Hypertension   . 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 KNEE PAIN 03/15/2009  . IMPINGEMENT SYNDROME 03/11/2008  . SHOULDER PAIN 10/16/2007  . HIGH BLOOD PRESSURE 10/16/2007    3:16 PM, 03/17/20 Mearl Latin PT,  DPT Physical Therapist at [facility] [place], [place], [address] Phone: [phone]   Fax:  [phone]  Name: Sheila Briggs MRN: [id number] Date of Birth: June 13, 1932

## 2020-03-23 DIAGNOSIS — D485 Neoplasm of uncertain behavior of skin: Secondary | ICD-10-CM | POA: Diagnosis not present

## 2020-03-23 DIAGNOSIS — H61002 Unspecified perichondritis of left external ear: Secondary | ICD-10-CM | POA: Diagnosis not present

## 2020-03-24 ENCOUNTER — Ambulatory Visit (HOSPITAL_COMMUNITY): Payer: Medicare Other | Admitting: Physical Therapy

## 2020-03-24 ENCOUNTER — Other Ambulatory Visit: Payer: Self-pay

## 2020-03-24 ENCOUNTER — Encounter (HOSPITAL_COMMUNITY): Payer: Self-pay | Admitting: Physical Therapy

## 2020-03-24 DIAGNOSIS — M6281 Muscle weakness (generalized): Secondary | ICD-10-CM

## 2020-03-24 DIAGNOSIS — R293 Abnormal posture: Secondary | ICD-10-CM

## 2020-03-24 DIAGNOSIS — M542 Cervicalgia: Secondary | ICD-10-CM

## 2020-03-24 NOTE — Therapy (Signed)
[facility] [address] [place], [place], [address] Phone: [phone]   Fax:  [phone]  Physical Therapy Treatment/Discharge Summary  Patient Details  Name: Sheila Briggs MRN: [id number] Date of Birth: 1932-08-26 Referring Provider (PT): Arther Abbott MD   Encounter Date: 03/24/2020  PHYSICAL THERAPY DISCHARGE SUMMARY  Visits from Start of Care: 6  Current functional level related to goals / functional outcomes: See below   Remaining deficits: See below   Education / Equipment: See below  Plan: Patient agrees to discharge.  Patient goals were met. Patient is being discharged due to meeting the stated rehab goals.  ?????       PT End of Session - 03/24/20 1550    Visit Number  6    Number of Visits  8    Date for PT Re-Evaluation  03/31/20    Authorization Type  Primary: Medicare Secondary BCBS supplement (medicare necessity, follows MCR, no auth)    Progress Note Due on Visit  8    PT Start Time  1519    PT Stop Time  1543    PT Time Calculation (min)  24 min    Activity Tolerance  Patient tolerated treatment well    Behavior During Therapy  WFL for tasks assessed/performed       Past Medical History:  Diagnosis Date  . Anxiety   . Arthritis   . Cancer (HCC)    basal cell of scalp  . GERD (gastroesophageal reflux disease)   . HOH (hard of hearing)   . Hypercholesteremia   . Hypertension   . Hypothyroidism   . Stroke ([place]) 2015   mild  . Thyroid disease     Past Surgical History:  Procedure Laterality Date  . ABDOMINAL HYSTERECTOMY    . APPENDECTOMY    . BIOPSY THYROID     benign  . CATARACT EXTRACTION W/PHACO  12/19/2012   Procedure: CATARACT EXTRACTION PHACO AND INTRAOCULAR LENS PLACEMENT (IOC);  Surgeon: Tonny Branch, MD;  Location: [facility];  Service: Ophthalmology;  Laterality: Left;  CDE 13.75  . CATARACT EXTRACTION W/PHACO Right 01/06/2013   Procedure: CATARACT EXTRACTION PHACO AND INTRAOCULAR  LENS PLACEMENT (IOC);  Surgeon: Tonny Branch, MD;  Location: [facility];  Service: Ophthalmology;  Laterality: Right;  CDE: 9.96  . CHOLECYSTECTOMY    . ENDARTERECTOMY Right 01/13/2016   Procedure: ENDARTERECTOMY CAROTID RIGHT;  Surgeon: Angelia Mould, MD;  Location: [facility];  Service: Vascular;  Laterality: Right;  . PATCH ANGIOPLASTY  01/13/2016   Procedure: RIGHT CAROTID ARTERY PATCH ANGIOPLASTY USING 1CM X 6CM XENOSURE BIOLOGIC PATCH;  Surgeon: Angelia Mould, MD;  Location: [facility];  Service: Vascular;;  . TONSILLECTOMY      There were no vitals filed for this visit.  Subjective Assessment - 03/24/20 1517    Subjective  Patient states she is doing good. She is having very little pain and stiffness is much better. She is doing her exercises at home. Patient states she is 95% better and she does not expect to be 100%.    Pertinent History  chronic back pain, HTN, osteoporosis    Patient Stated Goals  get rid of the pain and turn her head    Currently in Pain?  No/denies         [facility] PT Assessment - 03/24/20 0001      Assessment   Medical Diagnosis  Neck Pain    Referring Provider (PT)  Arther Abbott MD  Onset Date/Surgical Date  01/25/20    Next MD Visit  none scheduled    Prior Therapy  None for neck      Precautions   Precautions  None      Restrictions   Weight Bearing Restrictions  No      Balance Screen   Has the patient fallen in the past 6 months  No    Has the patient had a decrease in activity level because of a fear of falling?   Yes    Is the patient reluctant to leave their home because of a fear of falling?   No      Prior Function   Level of Independence  Independent    Vocation  Retired    Leisure  Copy   Overall Cognitive Status  Within Functional Limits for tasks assessed      Observation/Other Assessments   Observations  Ambulates without AD, forward head in seated, slouched, kyphotic, slight Dowager's Hump    Focus on  Therapeutic Outcomes (FOTO)   4% limited      Sensation   Light Touch  Appears Intact      Posture/Postural Control   Posture/Postural Control  Postural limitations    Postural Limitations  Rounded Shoulders;Forward head;Increased thoracic kyphosis    Posture Comments  minimal slouching today      AROM   Cervical Flexion  0% limited    Cervical Extension  0% limited    Cervical - Right Side Bend  25% limited, minimal stiffness    Cervical - Left Side Bend  25% limited    Cervical - Right Rotation  25% limited    Cervical - Left Rotation  0% limited      Strength   Right Shoulder Flexion  5/5    Right Shoulder ABduction  5/5   minimal R neck pain   Left Shoulder Flexion  5/5    Left Shoulder ABduction  5/5    Right Elbow Flexion  5/5    Right Elbow Extension  5/5    Left Elbow Flexion  5/5    Left Elbow Extension  5/5                            PT Education - 03/24/20 1549    Education Details  Patient educated on progress made, all goals met, returning to physical therapy if necessary in the future, living an active lifestyle    Person(s) Educated  Patient    Methods  Explanation;Demonstration    Comprehension  Verbalized understanding;Returned demonstration       PT Short Term Goals - 03/15/20 1506      PT SHORT TERM GOAL #1   Title  Patient will be independent with HEP in order to improve functional outcomes.    Time  2    Period  Weeks    Status  Achieved    Target Date  03/17/20      PT SHORT TERM GOAL #2   Title  Patient will report at least 25% improvement in symptoms for improved quality of life.    Time  2    Period  Weeks    Status  Achieved    Target Date  03/17/20        PT Long Term Goals - 03/24/20 1523      PT LONG TERM GOAL #1   Title  Patient  will report at least 75% improvement in symptoms for improved quality of life.    Time  4    Period  Weeks    Status  Achieved      PT LONG TERM GOAL #2   Title  Patient will  improve FOTO score by at least 5 points in order to indicate improved tolerance to activity.    Time  4    Period  Weeks    Status  Achieved      PT LONG TERM GOAL #3   Title  Patient will demonstrate at least 25% improvement in cervical ROM in all planes for improved ability to move head while driving    Time  4    Period  Weeks    Status  Achieved      PT LONG TERM GOAL #4   Title  Patient will be able to return to gardening with pain no greater than 1/10 for ability to participate in leisure activities.    Time  4    Period  Weeks    Status  Achieved            Plan - 03/24/20 1551    Clinical Impression Statement  Patient has met 2/2 short term goals with ability to complete HEP and improving symptoms. She has met 4/4 long term goals with improved symptoms, ROM, decreased pain, and improved activity tolerance. Patient remains limited by end range stiffness/discomfort. Patient feels that she can manage her symptoms with HEP that has been provided. Patient educated on progress made, continuing to live a healthy lifestyle, remaining active, and returning to physical therapy if needed in the future. Patient discharged from therapy at this time.    Personal Factors and Comorbidities  Age;Behavior Pattern;Comorbidity 3+;Past/Current Experience    Comorbidities  chronic back pain, HTN, osteoporosis    Examination-Activity Limitations  Bend;Lift;Other   turning   Examination-Participation Restrictions  Driving;Cleaning;Shop;Yard Work    Rehab Potential  Fair    PT Frequency  2x / week    PT Duration  4 weeks    PT Treatment/Interventions  ADLs/Self Care Home Management;Aquatic Therapy;Biofeedback;Canalith Repostioning;Cryotherapy;Electrical Stimulation;Iontophoresis '4mg'$ /ml Dexamethasone;Moist Heat;Traction;Ultrasound;DME Instruction;Gait training;Stair training;Functional mobility training;Therapeutic activities;Therapeutic exercise;Balance training;Neuromuscular  re-education;Patient/family education;Manual techniques;Manual lymph drainage;Compression bandaging;Scar mobilization;Passive range of motion;Dry needling;Energy conservation;Spinal Manipulations;Joint Manipulations    PT Next Visit Plan  n/a    PT Home Exercise Plan  4/26:: cervical retractions, scapular retractions, cervical excursions 5/3 upper trap stretch    Consulted and Agree with Plan of Care  Patient       Patient will benefit from skilled therapeutic intervention in order to improve the following deficits and impairments:  Decreased activity tolerance, Decreased balance, Decreased endurance, Decreased mobility, Decreased range of motion, Decreased strength, Hypomobility, Increased muscle spasms, Impaired flexibility, Improper body mechanics, Postural dysfunction, Pain  Visit Diagnosis: Cervicalgia  Muscle weakness (generalized)  Abnormal posture     Problem List Patient Active Problem List   Diagnosis Date Noted  . Carotid stenosis 01/13/2016  . CVA (cerebral infarction) 10/13/2014  . Arthritis of knee, degenerative 01/29/2014  . Hypothyroid 06/10/2012  . Hypertension   . 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 KNEE PAIN 03/15/2009  . IMPINGEMENT SYNDROME 03/11/2008  . SHOULDER PAIN 10/16/2007  . HIGH BLOOD PRESSURE 10/16/2007    3:57 PM, 03/24/20 Mearl Latin PT, DPT Physical Therapist at [facility] [place], [place], [address] Phone: [phone]  Fax:  [phone]  Name: Sheila Briggs MRN: [id number] Date of Birth: Jan 17, 1932

## 2020-03-25 ENCOUNTER — Ambulatory Visit (HOSPITAL_COMMUNITY): Payer: Medicare Other | Admitting: Physical Therapy

## 2020-03-29 ENCOUNTER — Encounter (HOSPITAL_COMMUNITY): Payer: Medicare Other | Admitting: Physical Therapy

## 2020-03-31 ENCOUNTER — Encounter (HOSPITAL_COMMUNITY): Payer: Medicare Other | Admitting: Physical Therapy

## 2020-06-11 DIAGNOSIS — H43812 Vitreous degeneration, left eye: Secondary | ICD-10-CM | POA: Diagnosis not present

## 2020-07-08 ENCOUNTER — Other Ambulatory Visit: Payer: Medicare Other

## 2020-07-08 ENCOUNTER — Other Ambulatory Visit: Payer: Self-pay | Admitting: Critical Care Medicine

## 2020-07-08 DIAGNOSIS — Z20822 Contact with and (suspected) exposure to covid-19: Secondary | ICD-10-CM

## 2020-07-09 LAB — NOVEL CORONAVIRUS, NAA: SARS-CoV-2, NAA: NOT DETECTED

## 2020-07-09 LAB — SARS-COV-2, NAA 2 DAY TAT

## 2020-07-29 ENCOUNTER — Ambulatory Visit: Payer: Self-pay | Attending: Internal Medicine

## 2020-07-29 DIAGNOSIS — Z23 Encounter for immunization: Secondary | ICD-10-CM

## 2020-07-29 NOTE — Progress Notes (Signed)
   Covid-19 Vaccination Clinic  Name:  Sheila Briggs    MRN: [id number] DOB: 10-27-1932  07/29/2020  Sheila Briggs was observed post Covid-19 immunization for 15 minutes without incident. She was provided with Vaccine Information Sheet and instruction to access the V-Safe system.   Sheila Briggs was instructed to call [phone] with any severe reactions post vaccine: Marland Kitchen Difficulty breathing  . Swelling of face and throat  . A fast heartbeat  . A bad rash all over body  . Dizziness and weakness

## 2020-08-18 ENCOUNTER — Other Ambulatory Visit (HOSPITAL_COMMUNITY): Payer: Self-pay | Admitting: Internal Medicine

## 2020-08-18 DIAGNOSIS — Z1231 Encounter for screening mammogram for malignant neoplasm of breast: Secondary | ICD-10-CM

## 2020-09-06 DIAGNOSIS — G463 Brain stem stroke syndrome: Secondary | ICD-10-CM | POA: Diagnosis not present

## 2020-09-06 DIAGNOSIS — R7303 Prediabetes: Secondary | ICD-10-CM | POA: Diagnosis not present

## 2020-09-06 DIAGNOSIS — I10 Essential (primary) hypertension: Secondary | ICD-10-CM | POA: Diagnosis not present

## 2020-09-06 DIAGNOSIS — E039 Hypothyroidism, unspecified: Secondary | ICD-10-CM | POA: Diagnosis not present

## 2020-09-06 DIAGNOSIS — E785 Hyperlipidemia, unspecified: Secondary | ICD-10-CM | POA: Diagnosis not present

## 2020-09-06 DIAGNOSIS — F329 Major depressive disorder, single episode, unspecified: Secondary | ICD-10-CM | POA: Diagnosis not present

## 2020-09-06 DIAGNOSIS — K219 Gastro-esophageal reflux disease without esophagitis: Secondary | ICD-10-CM | POA: Diagnosis not present

## 2020-09-06 DIAGNOSIS — I2510 Atherosclerotic heart disease of native coronary artery without angina pectoris: Secondary | ICD-10-CM | POA: Diagnosis not present

## 2020-09-06 DIAGNOSIS — Z79899 Other long term (current) drug therapy: Secondary | ICD-10-CM | POA: Diagnosis not present

## 2020-09-06 DIAGNOSIS — E042 Nontoxic multinodular goiter: Secondary | ICD-10-CM | POA: Diagnosis not present

## 2020-09-10 ENCOUNTER — Ambulatory Visit (HOSPITAL_COMMUNITY)
Admission: RE | Admit: 2020-09-10 | Discharge: 2020-09-10 | Disposition: A | Discharge status: Home / Self Care | Payer: Medicare Other | Source: Ambulatory Visit | Attending: Internal Medicine | Admitting: Internal Medicine

## 2020-09-10 ENCOUNTER — Other Ambulatory Visit: Payer: Self-pay

## 2020-09-10 DIAGNOSIS — Z1231 Encounter for screening mammogram for malignant neoplasm of breast: Secondary | ICD-10-CM

## 2020-09-13 ENCOUNTER — Other Ambulatory Visit (HOSPITAL_COMMUNITY): Payer: Self-pay | Admitting: Internal Medicine

## 2020-09-13 DIAGNOSIS — E785 Hyperlipidemia, unspecified: Secondary | ICD-10-CM | POA: Diagnosis not present

## 2020-09-13 DIAGNOSIS — I10 Essential (primary) hypertension: Secondary | ICD-10-CM | POA: Diagnosis not present

## 2020-09-13 DIAGNOSIS — Z23 Encounter for immunization: Secondary | ICD-10-CM | POA: Diagnosis not present

## 2020-09-13 DIAGNOSIS — Z6825 Body mass index (BMI) 25.0-25.9, adult: Secondary | ICD-10-CM | POA: Diagnosis not present

## 2020-09-13 DIAGNOSIS — I491 Atrial premature depolarization: Secondary | ICD-10-CM | POA: Diagnosis not present

## 2020-09-13 DIAGNOSIS — E039 Hypothyroidism, unspecified: Secondary | ICD-10-CM | POA: Diagnosis not present

## 2020-09-13 DIAGNOSIS — Z8673 Personal history of transient ischemic attack (TIA), and cerebral infarction without residual deficits: Secondary | ICD-10-CM | POA: Diagnosis not present

## 2020-09-13 DIAGNOSIS — Z780 Asymptomatic menopausal state: Secondary | ICD-10-CM

## 2020-09-13 DIAGNOSIS — F3340 Major depressive disorder, recurrent, in remission, unspecified: Secondary | ICD-10-CM | POA: Diagnosis not present

## 2020-09-13 DIAGNOSIS — M816 Localized osteoporosis [Lequesne]: Secondary | ICD-10-CM | POA: Diagnosis not present

## 2020-09-15 ENCOUNTER — Ambulatory Visit (HOSPITAL_COMMUNITY)
Admission: RE | Admit: 2020-09-15 | Discharge: 2020-09-15 | Disposition: A | Discharge status: Home / Self Care | Payer: Medicare Other | Source: Ambulatory Visit | Attending: Internal Medicine | Admitting: Internal Medicine

## 2020-09-15 ENCOUNTER — Other Ambulatory Visit: Payer: Self-pay

## 2020-09-15 DIAGNOSIS — R29890 Loss of height: Secondary | ICD-10-CM | POA: Diagnosis not present

## 2020-09-15 DIAGNOSIS — Z8739 Personal history of other diseases of the musculoskeletal system and connective tissue: Secondary | ICD-10-CM | POA: Diagnosis not present

## 2020-09-15 DIAGNOSIS — Z780 Asymptomatic menopausal state: Secondary | ICD-10-CM | POA: Diagnosis not present

## 2020-10-05 ENCOUNTER — Encounter (HOSPITAL_COMMUNITY): Payer: Self-pay

## 2020-10-05 ENCOUNTER — Encounter (HOSPITAL_COMMUNITY)
Admission: RE | Admit: 2020-10-05 | Discharge: 2020-10-05 | Disposition: A | Discharge status: Home / Self Care | Payer: Medicare Other | Source: Ambulatory Visit | Attending: Internal Medicine | Admitting: Internal Medicine

## 2020-10-05 ENCOUNTER — Other Ambulatory Visit: Payer: Self-pay

## 2020-10-05 DIAGNOSIS — M810 Age-related osteoporosis without current pathological fracture: Secondary | ICD-10-CM | POA: Insufficient documentation

## 2020-10-05 MED ORDER — SODIUM CHLORIDE 0.9 % IV SOLN: Freq: Once | INTRAVENOUS | Status: AC

## 2020-10-05 MED ORDER — ZOLEDRONIC ACID 5 MG/100ML IV SOLN
INTRAVENOUS | Status: AC
  Filled 2020-10-05: qty 100

## 2020-10-05 MED ORDER — ZOLEDRONIC ACID 5 MG/100ML IV SOLN
5.0000 mg | Freq: Once | INTRAVENOUS | Status: AC
  Administered 2020-10-05: 5 mg via INTRAVENOUS

## 2020-12-28 DIAGNOSIS — H61002 Unspecified perichondritis of left external ear: Secondary | ICD-10-CM | POA: Diagnosis not present

## 2020-12-28 DIAGNOSIS — L708 Other acne: Secondary | ICD-10-CM | POA: Diagnosis not present

## 2020-12-28 DIAGNOSIS — B078 Other viral warts: Secondary | ICD-10-CM | POA: Diagnosis not present

## 2020-12-28 DIAGNOSIS — L820 Inflamed seborrheic keratosis: Secondary | ICD-10-CM | POA: Diagnosis not present

## 2021-01-11 DIAGNOSIS — M25511 Pain in right shoulder: Secondary | ICD-10-CM | POA: Diagnosis not present

## 2021-01-11 DIAGNOSIS — I10 Essential (primary) hypertension: Secondary | ICD-10-CM | POA: Diagnosis not present

## 2021-01-20 ENCOUNTER — Encounter: Payer: Self-pay | Admitting: Orthopedic Surgery

## 2021-01-20 ENCOUNTER — Other Ambulatory Visit: Payer: Self-pay

## 2021-01-20 ENCOUNTER — Ambulatory Visit: Payer: Medicare Other

## 2021-01-20 ENCOUNTER — Ambulatory Visit (INDEPENDENT_AMBULATORY_CARE_PROVIDER_SITE_OTHER): Payer: Medicare Other | Admitting: Orthopedic Surgery

## 2021-01-20 VITALS — BP 146/73 | HR 87 | Ht 61.0 in | Wt 123.0 lb

## 2021-01-20 DIAGNOSIS — M25511 Pain in right shoulder: Secondary | ICD-10-CM | POA: Diagnosis not present

## 2021-01-20 DIAGNOSIS — M778 Other enthesopathies, not elsewhere classified: Secondary | ICD-10-CM | POA: Diagnosis not present

## 2021-01-20 DIAGNOSIS — G8929 Other chronic pain: Secondary | ICD-10-CM

## 2021-01-20 MED ORDER — MELOXICAM 7.5 MG PO TABS: 7.5000 mg | ORAL_TABLET | Freq: Every day | ORAL | 0 refills | Status: AC

## 2021-01-20 NOTE — Progress Notes (Addendum)
Chief Complaint  Patient presents with  . Shoulder Pain    right   87 year old female with pain right shoulder for 3 weeks also has some neck pain and stiffness with a history of cervical spondylosis without myelopathy  She is not on any anti-inflammatories at present denies any trauma does report decreased range of motion and trouble lifting her arm over her head  Review of systems nothing to add  Past Medical History:  Diagnosis Date  . Anxiety   . Arthritis   . Cancer (HCC)    basal cell of scalp  . GERD (gastroesophageal reflux disease)   . HOH (hard of hearing)   . Hypercholesteremia   . Hypertension   . Hypothyroidism   . Stroke ([place]) 2015   mild  . Thyroid disease    BP (!) 146/73   Pulse 87   Ht 5\' 1"  (1.549 m)   Wt 123 lb (55.8 kg)   BMI 23.24 kg/m  Physical Exam Constitutional:      General: She is not in acute distress.    Appearance: She is well-developed.     Comments: Well developed, well nourished Normal grooming and hygiene     Cardiovascular:     Comments: No peripheral edema Musculoskeletal:     Right shoulder: Tenderness and crepitus present. No swelling, deformity, effusion or laceration. Decreased range of motion. Normal pulse.     Left shoulder: Normal.     Comments: Positive impingement negative drop arm test for cuff tear  Skin:    General: Skin is warm and dry.  Neurological:     Mental Status: She is alert and oriented to person, place, and time.     Sensory: No sensory deficit.     Coordination: Coordination normal.     Gait: Gait normal.     Deep Tendon Reflexes: Reflexes are normal and symmetric.  Psychiatric:        Mood and Affect: Mood normal.        Behavior: Behavior normal.        Thought Content: Thought content normal.        Judgment: Judgment normal.     Comments: Affect normal      Internal images show normal glenohumeral joint type II acromion moderate AC joint arthritis  Encounter Diagnoses  Name Primary?  .  Acute pain of right shoulder   . Tendinitis of right shoulder Yes   Procedure note injection right shoulder  After consent was given verbally and timeout was taken subacromial injection was given in the right shoulder with Celestone 6 mg and 2 cc of Sensorcaine  This was done with alcohol and ethyl chloride to prepare the skin  No complications Recommend injection and home physical therapy with Codman exercises Plus Mobic  Meds ordered this encounter  Medications  . meloxicam (MOBIC) 7.5 MG tablet    Sig: Take 1 tablet (7.5 mg total) by mouth daily for 14 days. Deliver please    Dispense:  14 tablet    Refill:  0

## 2021-02-15 ENCOUNTER — Other Ambulatory Visit: Payer: Self-pay

## 2021-02-15 DIAGNOSIS — I6521 Occlusion and stenosis of right carotid artery: Secondary | ICD-10-CM

## 2021-03-01 DIAGNOSIS — H43812 Vitreous degeneration, left eye: Secondary | ICD-10-CM | POA: Diagnosis not present

## 2021-03-02 ENCOUNTER — Ambulatory Visit (INDEPENDENT_AMBULATORY_CARE_PROVIDER_SITE_OTHER): Payer: Medicare Other | Admitting: Vascular Surgery

## 2021-03-02 ENCOUNTER — Ambulatory Visit (HOSPITAL_COMMUNITY)
Admission: RE | Admit: 2021-03-02 | Discharge: 2021-03-02 | Disposition: A | Discharge status: Home / Self Care | Payer: Medicare Other | Source: Ambulatory Visit | Attending: Vascular Surgery | Admitting: Vascular Surgery

## 2021-03-02 ENCOUNTER — Other Ambulatory Visit: Payer: Self-pay

## 2021-03-02 ENCOUNTER — Encounter: Payer: Self-pay | Admitting: Vascular Surgery

## 2021-03-02 VITALS — BP 136/82 | HR 96 | Temp 97.7°F | Resp 16 | Ht 64.0 in | Wt 123.0 lb

## 2021-03-02 DIAGNOSIS — I6521 Occlusion and stenosis of right carotid artery: Secondary | ICD-10-CM | POA: Diagnosis not present

## 2021-03-02 DIAGNOSIS — Z48812 Encounter for surgical aftercare following surgery on the circulatory system: Secondary | ICD-10-CM | POA: Diagnosis not present

## 2021-03-02 NOTE — Progress Notes (Signed)
REASON FOR VISIT:   Follow-up after right carotid endarterectomy  MEDICAL ISSUES:   S/P RIGHT CAROTID ENDARTERECTOMY: This patient underwent a right carotid endarterectomy in March 2017.  She comes in for an 68-month follow-up visit.  She has no recurrent carotid stenosis on the right and no carotid stenosis on the left.  She is asymptomatic.  She is on aspirin and is on a statin.  She is very active and likes to work in the yard.  I think it safe to keep her follow-up in 18 months.  I ordered a follow-up carotid duplex scan in 18 months.  She will be seen on the PA schedule at that time.  I explained this to her.   HPI:   Sheila Briggs is a pleasant 87 y.o. female who I last saw on 08/27/2019.  She underwent a right carotid endarterectomy on 01/13/2016.  She came in for a yearly follow-up visit.  She was on aspirin and was on a statin.  I felt that given her age it would be reasonable to stretch her follow-up out to 18 months.  She comes in for an 36-month follow-up visit.  Since I saw her last, she denies any history of stroke, TIAs, expressive or receptive aphasia, or amaurosis fugax.  She is not a smoker.  She is very active and likes to work in the yard.  Past Medical History:  Diagnosis Date  . Anxiety   . Arthritis   . Cancer (HCC)    basal cell of scalp  . GERD (gastroesophageal reflux disease)   . HOH (hard of hearing)   . Hypercholesteremia   . Hypertension   . Hypothyroidism   . Stroke ([place]) 2015   mild  . Thyroid disease     History reviewed. No pertinent family history.  SOCIAL HISTORY: Social History   Tobacco Use  . Smoking status: Never Smoker  . Smokeless tobacco: Never Used  Substance Use Topics  . Alcohol use: No    No Known Allergies  Current Outpatient Medications  Medication Sig Dispense Refill  . aspirin EC 81 MG tablet Take 301 mg by mouth daily.     Marland Kitchen atorvastatin (LIPITOR) 20 MG tablet Take 20 mg by mouth daily.    . chlorthalidone  (HYGROTON) 25 MG tablet Take 25 mg by mouth daily.    . citalopram (CELEXA) 10 MG tablet Take 5 mg by mouth daily.    . hydrochlorothiazide (HYDRODIURIL) 12.5 MG tablet Take 25 mg by mouth daily.     Marland Kitchen levothyroxine (SYNTHROID, LEVOTHROID) 25 MCG tablet Take 25 mcg by mouth daily before breakfast.    . Multiple Vitamin (MULTIVITAMIN WITH MINERALS) TABS Take 1 tablet by mouth daily.    Marland Kitchen olmesartan (BENICAR) 20 MG tablet   2  . pantoprazole (PROTONIX) 40 MG tablet TK ONE T PO D    . Polyethyl Glycol-Propyl Glycol (SYSTANE ULTRA OP) Apply 1 drop to eye 4 (four) times daily.    . potassium chloride SA (K-DUR,KLOR-CON) 20 MEQ tablet Take 1 tablet (20 mEq total) by mouth 2 (two) times daily. 30 tablet 0  . traMADol (ULTRAM) 50 MG tablet Take 1 tablet (50 mg total) by mouth every 8 (eight) hours as needed. 20 tablet 0  . meclizine (ANTIVERT) 12.5 MG tablet Take 1 tablet (12.5 mg total) by mouth 3 (three) times daily as needed for dizziness. (Patient not taking: Reported on 03/02/2021) 30 tablet 0   No current facility-administered medications for this  visit.    REVIEW OF SYSTEMS:  [X]  denotes positive finding, [ ]  denotes negative finding Cardiac  Comments:  Chest pain or chest pressure:    Shortness of breath upon exertion:    Short of breath when lying flat:    Irregular heart rhythm:        Vascular    Pain in calf, thigh, or hip brought on by ambulation:    Pain in feet at night that wakes you up from your sleep:     Blood clot in your veins:    Leg swelling:         Pulmonary    Oxygen at home:    Productive cough:     Wheezing:         Neurologic    Sudden weakness in arms or legs:     Sudden numbness in arms or legs:     Sudden onset of difficulty speaking or slurred speech:    Temporary loss of vision in one eye:     Problems with dizziness:         Gastrointestinal    Blood in stool:     Vomited blood:         Genitourinary    Burning when urinating:     Blood in  urine:        Psychiatric    Major depression:         Hematologic    Bleeding problems:    Problems with blood clotting too easily:        Skin    Rashes or ulcers:        Constitutional    Fever or chills:     PHYSICAL EXAM:   Vitals:   03/02/21 1235 03/02/21 1240  BP: (!) 143/82 136/82  Pulse: 96 96  Resp: 16   Temp: 97.7 F (36.5 C)   TempSrc: Temporal   SpO2: 95%   Weight: 123 lb (55.8 kg)   Height: 5\' 4"  (1.626 m)     GENERAL: The patient is a well-nourished female, in no acute distress. The vital signs are documented above. CARDIAC: There is a regular rate and rhythm.  VASCULAR: I do not detect carotid bruits. PULMONARY: There is good air exchange bilaterally without wheezing or rales. ABDOMEN: Soft and non-tender with normal pitched bowel sounds.  MUSCULOSKELETAL: There are no major deformities or cyanosis. NEUROLOGIC: No focal weakness or paresthesias are detected. SKIN: There are no ulcers or rashes noted. PSYCHIATRIC: The patient has a normal affect.  DATA:    CAROTID DUPLEX: I have independently interpreted her carotid duplex scan today.  On the right side, the right carotid endarterectomy site is widely patent.  The right vertebral artery is patent with antegrade flow.  On the left side there is a less than 39% stenosis.  The left vertebral artery is patent with antegrade flow.  [name] [facility] [phone]

## 2021-03-04 DIAGNOSIS — Z23 Encounter for immunization: Secondary | ICD-10-CM | POA: Diagnosis not present

## 2021-05-12 DIAGNOSIS — F3340 Major depressive disorder, recurrent, in remission, unspecified: Secondary | ICD-10-CM | POA: Diagnosis not present

## 2021-05-12 DIAGNOSIS — I10 Essential (primary) hypertension: Secondary | ICD-10-CM | POA: Diagnosis not present

## 2021-05-14 DIAGNOSIS — Z20822 Contact with and (suspected) exposure to covid-19: Secondary | ICD-10-CM | POA: Diagnosis not present

## 2021-08-19 ENCOUNTER — Other Ambulatory Visit (HOSPITAL_COMMUNITY): Payer: Self-pay | Admitting: Internal Medicine

## 2021-08-19 DIAGNOSIS — Z1231 Encounter for screening mammogram for malignant neoplasm of breast: Secondary | ICD-10-CM

## 2021-08-28 DIAGNOSIS — Z23 Encounter for immunization: Secondary | ICD-10-CM | POA: Diagnosis not present

## 2021-09-09 DIAGNOSIS — J019 Acute sinusitis, unspecified: Secondary | ICD-10-CM | POA: Diagnosis not present

## 2021-09-12 ENCOUNTER — Other Ambulatory Visit: Payer: Self-pay

## 2021-09-12 ENCOUNTER — Ambulatory Visit (HOSPITAL_COMMUNITY)
Admission: RE | Admit: 2021-09-12 | Discharge: 2021-09-12 | Disposition: A | Discharge status: Home / Self Care | Payer: Medicare Other | Source: Ambulatory Visit | Attending: Internal Medicine | Admitting: Internal Medicine

## 2021-09-12 DIAGNOSIS — Z1231 Encounter for screening mammogram for malignant neoplasm of breast: Secondary | ICD-10-CM | POA: Diagnosis not present

## 2021-09-13 DIAGNOSIS — K219 Gastro-esophageal reflux disease without esophagitis: Secondary | ICD-10-CM | POA: Diagnosis not present

## 2021-09-13 DIAGNOSIS — Z79899 Other long term (current) drug therapy: Secondary | ICD-10-CM | POA: Diagnosis not present

## 2021-09-13 DIAGNOSIS — I10 Essential (primary) hypertension: Secondary | ICD-10-CM | POA: Diagnosis not present

## 2021-09-13 DIAGNOSIS — E039 Hypothyroidism, unspecified: Secondary | ICD-10-CM | POA: Diagnosis not present

## 2021-09-13 DIAGNOSIS — F329 Major depressive disorder, single episode, unspecified: Secondary | ICD-10-CM | POA: Diagnosis not present

## 2021-09-13 DIAGNOSIS — E785 Hyperlipidemia, unspecified: Secondary | ICD-10-CM | POA: Diagnosis not present

## 2021-09-13 DIAGNOSIS — I6300 Cerebral infarction due to thrombosis of unspecified precerebral artery: Secondary | ICD-10-CM | POA: Diagnosis not present

## 2021-09-19 DIAGNOSIS — E785 Hyperlipidemia, unspecified: Secondary | ICD-10-CM | POA: Diagnosis not present

## 2021-09-19 DIAGNOSIS — Z23 Encounter for immunization: Secondary | ICD-10-CM | POA: Diagnosis not present

## 2021-09-19 DIAGNOSIS — M9080 Osteopathy in diseases classified elsewhere, unspecified site: Secondary | ICD-10-CM | POA: Diagnosis not present

## 2021-09-19 DIAGNOSIS — F3340 Major depressive disorder, recurrent, in remission, unspecified: Secondary | ICD-10-CM | POA: Diagnosis not present

## 2021-09-19 DIAGNOSIS — Z8673 Personal history of transient ischemic attack (TIA), and cerebral infarction without residual deficits: Secondary | ICD-10-CM | POA: Diagnosis not present

## 2021-09-19 DIAGNOSIS — I10 Essential (primary) hypertension: Secondary | ICD-10-CM | POA: Diagnosis not present

## 2021-10-05 ENCOUNTER — Encounter (HOSPITAL_COMMUNITY)
Admission: RE | Admit: 2021-10-05 | Discharge: 2021-10-05 | Disposition: A | Discharge status: Home / Self Care | Payer: Medicare Other | Source: Ambulatory Visit | Attending: Internal Medicine | Admitting: Internal Medicine

## 2021-10-05 VITALS — BP 135/57 | HR 80 | Resp 18

## 2021-10-05 DIAGNOSIS — M810 Age-related osteoporosis without current pathological fracture: Secondary | ICD-10-CM | POA: Insufficient documentation

## 2021-10-05 LAB — COMPREHENSIVE METABOLIC PANEL
ALT: 15 U/L (ref 0–44)
AST: 23 U/L (ref 15–41)
Albumin: 3.8 g/dL (ref 3.5–5.0)
Alkaline Phosphatase: 91 U/L (ref 38–126)
Anion gap: 6 (ref 5–15)
BUN: 17 mg/dL (ref 8–23)
CO2: 30 mmol/L (ref 22–32)
Calcium: 9.2 mg/dL (ref 8.9–10.3)
Chloride: 95 mmol/L — ABNORMAL LOW (ref 98–111)
Creatinine, Ser: 0.68 mg/dL (ref 0.44–1.00)
GFR, Estimated: >60 mL/min (ref >60)
Glucose, Bld: 107 mg/dL — ABNORMAL HIGH (ref 70–99)
Potassium: 3.4 mmol/L — ABNORMAL LOW (ref 3.5–5.1)
Sodium: 131 mmol/L — ABNORMAL LOW (ref 135–145)
Total Bilirubin: 0.5 mg/dL (ref 0.3–1.2)
Total Protein: 6.9 g/dL (ref 6.5–8.1)

## 2021-10-05 MED ORDER — SODIUM CHLORIDE 0.9 % IV SOLN: Freq: Once | INTRAVENOUS | Status: AC

## 2021-10-05 MED ORDER — ZOLEDRONIC ACID 5 MG/100ML IV SOLN
5.0000 mg | Freq: Once | INTRAVENOUS | Status: AC
  Administered 2021-10-05: 5 mg via INTRAVENOUS
  Filled 2021-10-05: qty 100

## 2021-10-10 LAB — POCT I-STAT, CHEM 8
BUN: 17 mg/dL (ref 8–23)
Calcium, Ion: 1.21 mmol/L (ref 1.15–1.40)
Chloride: 94 mmol/L — ABNORMAL LOW (ref 98–111)
Creatinine, Ser: 0.7 mg/dL (ref 0.44–1.00)
Glucose, Bld: 107 mg/dL — ABNORMAL HIGH (ref 70–99)
HCT: 41 % (ref 36.0–46.0)
Hemoglobin: 13.9 g/dL (ref 12.0–15.0)
Potassium: 3.5 mmol/L (ref 3.5–5.1)
Sodium: 135 mmol/L (ref 135–145)
TCO2: 27 mmol/L (ref 22–32)

## 2022-01-17 DIAGNOSIS — Z8673 Personal history of transient ischemic attack (TIA), and cerebral infarction without residual deficits: Secondary | ICD-10-CM | POA: Diagnosis not present

## 2022-01-17 DIAGNOSIS — I6521 Occlusion and stenosis of right carotid artery: Secondary | ICD-10-CM | POA: Diagnosis not present

## 2022-01-17 DIAGNOSIS — I10 Essential (primary) hypertension: Secondary | ICD-10-CM | POA: Diagnosis not present

## 2022-01-17 DIAGNOSIS — M1990 Unspecified osteoarthritis, unspecified site: Secondary | ICD-10-CM | POA: Diagnosis not present

## 2022-02-28 DIAGNOSIS — H35363 Drusen (degenerative) of macula, bilateral: Secondary | ICD-10-CM | POA: Diagnosis not present

## 2022-02-28 DIAGNOSIS — H04123 Dry eye syndrome of bilateral lacrimal glands: Secondary | ICD-10-CM | POA: Diagnosis not present

## 2022-03-15 DIAGNOSIS — Z20822 Contact with and (suspected) exposure to covid-19: Secondary | ICD-10-CM | POA: Diagnosis not present

## 2022-05-23 DIAGNOSIS — I10 Essential (primary) hypertension: Secondary | ICD-10-CM | POA: Diagnosis not present

## 2022-05-23 DIAGNOSIS — I779 Disorder of arteries and arterioles, unspecified: Secondary | ICD-10-CM | POA: Diagnosis not present

## 2022-07-21 DIAGNOSIS — T148XXA Other injury of unspecified body region, initial encounter: Secondary | ICD-10-CM | POA: Diagnosis not present

## 2022-07-21 DIAGNOSIS — L237 Allergic contact dermatitis due to plants, except food: Secondary | ICD-10-CM | POA: Diagnosis not present

## 2022-08-11 ENCOUNTER — Other Ambulatory Visit (HOSPITAL_COMMUNITY): Payer: Self-pay | Admitting: Internal Medicine

## 2022-08-18 DIAGNOSIS — Z23 Encounter for immunization: Secondary | ICD-10-CM | POA: Diagnosis not present

## 2022-08-30 DIAGNOSIS — Z23 Encounter for immunization: Secondary | ICD-10-CM | POA: Diagnosis not present

## 2022-09-07 ENCOUNTER — Other Ambulatory Visit (HOSPITAL_COMMUNITY): Payer: Self-pay | Admitting: Internal Medicine

## 2022-09-07 DIAGNOSIS — Z1231 Encounter for screening mammogram for malignant neoplasm of breast: Secondary | ICD-10-CM

## 2022-09-11 DIAGNOSIS — F32A Depression, unspecified: Secondary | ICD-10-CM | POA: Diagnosis not present

## 2022-09-11 DIAGNOSIS — I10 Essential (primary) hypertension: Secondary | ICD-10-CM | POA: Diagnosis not present

## 2022-09-11 DIAGNOSIS — E039 Hypothyroidism, unspecified: Secondary | ICD-10-CM | POA: Diagnosis not present

## 2022-09-11 DIAGNOSIS — I639 Cerebral infarction, unspecified: Secondary | ICD-10-CM | POA: Diagnosis not present

## 2022-09-11 DIAGNOSIS — Z79899 Other long term (current) drug therapy: Secondary | ICD-10-CM | POA: Diagnosis not present

## 2022-09-11 DIAGNOSIS — K219 Gastro-esophageal reflux disease without esophagitis: Secondary | ICD-10-CM | POA: Diagnosis not present

## 2022-09-11 DIAGNOSIS — E785 Hyperlipidemia, unspecified: Secondary | ICD-10-CM | POA: Diagnosis not present

## 2022-09-11 DIAGNOSIS — R7303 Prediabetes: Secondary | ICD-10-CM | POA: Diagnosis not present

## 2022-09-11 DIAGNOSIS — I2510 Atherosclerotic heart disease of native coronary artery without angina pectoris: Secondary | ICD-10-CM | POA: Diagnosis not present

## 2022-09-19 DIAGNOSIS — M810 Age-related osteoporosis without current pathological fracture: Secondary | ICD-10-CM | POA: Diagnosis not present

## 2022-09-19 DIAGNOSIS — E785 Hyperlipidemia, unspecified: Secondary | ICD-10-CM | POA: Diagnosis not present

## 2022-09-19 DIAGNOSIS — Z23 Encounter for immunization: Secondary | ICD-10-CM | POA: Diagnosis not present

## 2022-09-19 DIAGNOSIS — I10 Essential (primary) hypertension: Secondary | ICD-10-CM | POA: Diagnosis not present

## 2022-09-19 DIAGNOSIS — I480 Paroxysmal atrial fibrillation: Secondary | ICD-10-CM | POA: Diagnosis not present

## 2022-09-21 ENCOUNTER — Ambulatory Visit (HOSPITAL_COMMUNITY)
Admission: RE | Admit: 2022-09-21 | Discharge: 2022-09-21 | Disposition: A | Discharge status: Home / Self Care | Payer: Medicare Other | Source: Ambulatory Visit | Attending: Internal Medicine | Admitting: Internal Medicine

## 2022-09-21 DIAGNOSIS — Z1231 Encounter for screening mammogram for malignant neoplasm of breast: Secondary | ICD-10-CM | POA: Diagnosis not present

## 2022-10-12 ENCOUNTER — Encounter (HOSPITAL_COMMUNITY)
Admission: RE | Admit: 2022-10-12 | Discharge: 2022-10-12 | Disposition: A | Discharge status: Home / Self Care | Payer: Medicare Other | Source: Ambulatory Visit | Attending: Internal Medicine | Admitting: Internal Medicine

## 2022-10-12 ENCOUNTER — Encounter (HOSPITAL_COMMUNITY): Payer: Medicare Other

## 2022-10-12 VITALS — BP 128/45 | HR 73 | Temp 97.6°F | Resp 16

## 2022-10-12 DIAGNOSIS — M810 Age-related osteoporosis without current pathological fracture: Secondary | ICD-10-CM | POA: Insufficient documentation

## 2022-10-12 MED ORDER — ACETAMINOPHEN 325 MG PO TABS: 650.0000 mg | ORAL_TABLET | Freq: Once | ORAL | Status: DC

## 2022-10-12 MED ORDER — ZOLEDRONIC ACID 5 MG/100ML IV SOLN
5.0000 mg | Freq: Once | INTRAVENOUS | Status: AC
  Administered 2022-10-12: 5 mg via INTRAVENOUS
  Filled 2022-10-12: qty 100

## 2022-10-12 MED ORDER — DIPHENHYDRAMINE HCL 25 MG PO CAPS: 25.0000 mg | ORAL_CAPSULE | Freq: Once | ORAL | Status: DC

## 2022-10-12 NOTE — Progress Notes (Signed)
Diagnosis: Osteoporosis  Provider:  Asencion Noble MD  Procedure: Infusion  IV Type: Peripheral, IV Location: L Antecubital  Reclast (Zolendronic Acid), Dose: 5 mg  Infusion Start Time: 1408 pm  Infusion Stop Time: 7579 pm  Post Infusion IV Care: Observation period completed and Peripheral IV Discontinued  Discharge: Condition: Good, Destination: Home . AVS provided to patient.   Performed by:  Oren Beckmann, RN

## 2022-12-11 ENCOUNTER — Other Ambulatory Visit: Payer: Self-pay | Admitting: *Deleted

## 2022-12-11 DIAGNOSIS — Z48812 Encounter for surgical aftercare following surgery on the circulatory system: Secondary | ICD-10-CM

## 2022-12-11 DIAGNOSIS — I6521 Occlusion and stenosis of right carotid artery: Secondary | ICD-10-CM

## 2022-12-14 ENCOUNTER — Encounter (HOSPITAL_COMMUNITY): Payer: Self-pay | Admitting: Internal Medicine

## 2022-12-21 ENCOUNTER — Ambulatory Visit (INDEPENDENT_AMBULATORY_CARE_PROVIDER_SITE_OTHER): Payer: Medicare Other | Admitting: Physician Assistant

## 2022-12-21 ENCOUNTER — Ambulatory Visit (HOSPITAL_COMMUNITY)
Admission: RE | Admit: 2022-12-21 | Discharge: 2022-12-21 | Disposition: A | Discharge status: Home / Self Care | Payer: Medicare Other | Source: Ambulatory Visit | Attending: Vascular Surgery | Admitting: Vascular Surgery

## 2022-12-21 VITALS — BP 142/71 | HR 75 | Temp 97.8°F | Ht 61.0 in | Wt 125.0 lb

## 2022-12-21 DIAGNOSIS — I6521 Occlusion and stenosis of right carotid artery: Secondary | ICD-10-CM

## 2022-12-21 DIAGNOSIS — Z48812 Encounter for surgical aftercare following surgery on the circulatory system: Secondary | ICD-10-CM | POA: Diagnosis not present

## 2022-12-21 NOTE — Progress Notes (Signed)
Established Carotid Patient   History of Present Illness   Sheila Briggs is a 89 y.o. (02-26-1932) female who presents for surveillance of carotid artery stenosis.  She has a history of right carotid endarterectomy on 01/13/2016.  She was last seen by Dr. Scot Dock in 2022 and was asymptomatic at the time.  She was to continue taking aspirin and statin and follow-up in 18 months.  At follow-up today she was doing well.  She denies any signs or symptoms of stroke such as sudden onset of weakness/numbness, slurred speech, facial droop, amaurosis fugax.  She still stays fairly active.  She is still taking her aspirin and statin.  Current Outpatient Medications  Medication Sig Dispense Refill   aspirin EC 81 MG tablet Take 301 mg by mouth daily.      atorvastatin (LIPITOR) 20 MG tablet Take 20 mg by mouth daily.     chlorthalidone (HYGROTON) 25 MG tablet Take 25 mg by mouth daily.     citalopram (CELEXA) 10 MG tablet Take 5 mg by mouth daily.     hydrochlorothiazide (HYDRODIURIL) 12.5 MG tablet Take 25 mg by mouth daily.      levothyroxine (SYNTHROID, LEVOTHROID) 25 MCG tablet Take 25 mcg by mouth daily before breakfast.     meclizine (ANTIVERT) 12.5 MG tablet Take 1 tablet (12.5 mg total) by mouth 3 (three) times daily as needed for dizziness. (Patient not taking: Reported on 03/02/2021) 30 tablet 0   Multiple Vitamin (MULTIVITAMIN WITH MINERALS) TABS Take 1 tablet by mouth daily.     olmesartan (BENICAR) 20 MG tablet   2   pantoprazole (PROTONIX) 40 MG tablet TK ONE T PO D     Polyethyl Glycol-Propyl Glycol (SYSTANE ULTRA OP) Apply 1 drop to eye 4 (four) times daily.     potassium chloride SA (K-DUR,KLOR-CON) 20 MEQ tablet Take 1 tablet (20 mEq total) by mouth 2 (two) times daily. 30 tablet 0   traMADol (ULTRAM) 50 MG tablet Take 1 tablet (50 mg total) by mouth every 8 (eight) hours as needed. 20 tablet 0   No current facility-administered medications for this visit.    REVIEW OF SYSTEMS  (negative unless checked):   Cardiac:  '[]'$  Chest pain or chest pressure? '[]'$  Shortness of breath upon activity? '[]'$  Shortness of breath when lying flat? '[]'$  Irregular heart rhythm?  Vascular:  '[]'$  Pain in calf, thigh, or hip brought on by walking? '[]'$  Pain in feet at night that wakes you up from your sleep? '[]'$  Blood clot in your veins? '[]'$  Leg swelling?  Pulmonary:  '[]'$  Oxygen at home? '[]'$  Productive cough? '[]'$  Wheezing?  Neurologic:  '[]'$  Sudden weakness in arms or legs? '[]'$  Sudden numbness in arms or legs? '[]'$  Sudden onset of difficult speaking or slurred speech? '[]'$  Temporary loss of vision in one eye? '[]'$  Problems with dizziness?  Gastrointestinal:  '[]'$  Blood in stool? '[]'$  Vomited blood?  Genitourinary:  '[]'$  Burning when urinating? '[]'$  Blood in urine?  Psychiatric:  '[]'$  Major depression  Hematologic:  '[]'$  Bleeding problems? '[]'$  Problems with blood clotting?  Dermatologic:  '[]'$  Rashes or ulcers?  Constitutional:  '[]'$  Fever or chills?  Ear/Nose/Throat:  '[]'$  Change in hearing? '[]'$  Nose bleeds? '[]'$  Sore throat?  Musculoskeletal:  '[]'$  Back pain? '[]'$  Joint pain? '[]'$  Muscle pain?   Physical Examination   Vitals:   12/21/22 1453 12/21/22 1454  BP: (!) 147/68 (!) 142/71  Pulse: 75   Temp: 97.8 F (36.6 C)   TempSrc: Temporal  SpO2: 97%   Weight: 125 lb (56.7 kg)   Height: '5\' 1"'$  (1.549 m)    Body mass index is 23.62 kg/m.  General:  WDWN in NAD; vital signs documented above Gait: Not observed HENT: WNL, normocephalic Pulmonary: normal non-labored breathing  Cardiac: Regular rate and rhythm, no carotid bruit Abdomen: soft, NT, no masses Skin: without rashes Vascular Exam/Pulses: 2+ radial pulses bilaterally Extremities: No ischemic changes or wounds Musculoskeletal: no muscle wasting or atrophy  Neurologic: A&O X 3;  No focal weakness or paresthesias are detected Psychiatric:  The pt has Normal affect.  Non-Invasive Vascular Imaging   B Carotid Duplex (12/21/2022):   R ICA stenosis:  1-39% R VA:  patent and antegrade L ICA stenosis:  1-39% L VA:  patent and antegrade   Medical Decision Making   Sheila Briggs is a 89 y.o. female who presents for surveillance of carotid artery stenosis  Based on the patient's vascular studies, the patient's bilateral carotid artery stenosis is unchanged.  There is no stenosis in the left ICA and no restenosis of the right ICA s/p endarterectomy. She denies any signs or symptoms of CVA or TIA.  She also denies claudication and rest pain. She has palpable and equal radial pulses 2+.  I do not detect any carotid bruits on exam She will continue her aspirin and statin.  She can follow-up with our office again in 18 months with repeat carotid duplex study   Vicente Serene PA-C [facility]: [phone]  Call MD: Virl Cagey

## 2022-12-22 ENCOUNTER — Encounter (HOSPITAL_COMMUNITY): Payer: Self-pay | Admitting: Internal Medicine

## 2023-01-11 ENCOUNTER — Encounter: Payer: Self-pay | Admitting: Radiology

## 2023-01-16 DIAGNOSIS — I10 Essential (primary) hypertension: Secondary | ICD-10-CM | POA: Diagnosis not present

## 2023-01-16 DIAGNOSIS — M71349 Other bursal cyst, unspecified hand: Secondary | ICD-10-CM | POA: Diagnosis not present

## 2023-01-22 DIAGNOSIS — M79641 Pain in right hand: Secondary | ICD-10-CM | POA: Diagnosis not present

## 2023-01-22 DIAGNOSIS — M67441 Ganglion, right hand: Secondary | ICD-10-CM | POA: Diagnosis not present

## 2023-02-19 DIAGNOSIS — I10 Essential (primary) hypertension: Secondary | ICD-10-CM | POA: Diagnosis not present

## 2023-02-19 DIAGNOSIS — Z8673 Personal history of transient ischemic attack (TIA), and cerebral infarction without residual deficits: Secondary | ICD-10-CM | POA: Diagnosis not present

## 2023-04-02 ENCOUNTER — Encounter: Payer: Self-pay | Admitting: Orthopedic Surgery

## 2023-04-02 ENCOUNTER — Ambulatory Visit (INDEPENDENT_AMBULATORY_CARE_PROVIDER_SITE_OTHER): Payer: Medicare Other | Admitting: Orthopedic Surgery

## 2023-04-02 ENCOUNTER — Other Ambulatory Visit (INDEPENDENT_AMBULATORY_CARE_PROVIDER_SITE_OTHER): Payer: Medicare Other

## 2023-04-02 VITALS — BP 149/65 | HR 86 | Ht 61.0 in | Wt 123.0 lb

## 2023-04-02 DIAGNOSIS — M5116 Intervertebral disc disorders with radiculopathy, lumbar region: Secondary | ICD-10-CM

## 2023-04-02 DIAGNOSIS — M5410 Radiculopathy, site unspecified: Secondary | ICD-10-CM

## 2023-04-02 DIAGNOSIS — M545 Low back pain, unspecified: Secondary | ICD-10-CM

## 2023-04-02 DIAGNOSIS — M25551 Pain in right hip: Secondary | ICD-10-CM | POA: Diagnosis not present

## 2023-04-02 DIAGNOSIS — M4316 Spondylolisthesis, lumbar region: Secondary | ICD-10-CM

## 2023-04-02 MED ORDER — TIZANIDINE HCL 4 MG PO TABS: 4.0000 mg | ORAL_TABLET | Freq: Four times a day (QID) | ORAL | 0 refills | Status: DC | PRN

## 2023-04-02 MED ORDER — PREDNISONE 10 MG PO TABS: 10.0000 mg | ORAL_TABLET | Freq: Three times a day (TID) | ORAL | 0 refills | Status: DC

## 2023-04-02 NOTE — Progress Notes (Addendum)
Chief Complaint  Patient presents with   Back Pain    Down right leg/ states ? Infection feels hot at times      Assessment and plan  Encounter Diagnoses  Name Primary?   Pain in right hip    Lumbar pain    Radicular pain of right lower extremity Yes   Spondylolisthesis, lumbar region, chronic      I think this is acute radiculitis secondary to the spinal disease  Recommend the following medication  Meds ordered this encounter  Medications   tiZANidine (ZANAFLEX) 4 MG tablet    Sig: Take 1 tablet (4 mg total) by mouth every 6 (six) hours as needed for muscle spasms.    Dispense:  30 tablet    Refill:  0   predniSONE (DELTASONE) 10 MG tablet    Sig: Take 1 tablet (10 mg total) by mouth 3 (three) times daily.    Dispense:  42 tablet    Refill:  0    Return in 2 weeks Established patient 89 year old female last seen for back and leg pain in 2019  At that time we treated her for 52-month history of lower back pain without trauma.  She told us back then she had had some epidural injections years prior to that.  Diagnosis at that time was spondylolysis spondylolisthesis L5 on S1 with degenerative lumbar scoliosis.  We put her on Cataflam and did 6 weeks of PT.  She responded well to physical therapy   Patient Active Problem List   Diagnosis Date Noted   Senile osteoporosis 10/12/2022   Carotid stenosis 01/13/2016   CVA (cerebral infarction) 10/13/2014   Arthritis of knee, degenerative 01/29/2014   Hypothyroid 06/10/2012   Hypertension    CHONDROMALACIA PATELLA 03/15/2009   KNEE PAIN 03/15/2009   IMPINGEMENT SYNDROME 03/11/2008   SHOULDER PAIN 10/16/2007   HIGH BLOOD PRESSURE 10/16/2007     PDMP no opioids listed  Comes in today with radicular leg pain for   She tried Tylenol and diclofenac gel  Pain is been less than 6 weeks  It is in the right lower back  There was no injury  No prior surgery  Red flags none  Pain does not radiate to the foot but to  the medial right calf.  Pain does radiate into the thigh area as well   Exam  Physical Exam Vitals and nursing note reviewed.  Constitutional:      Appearance: Normal appearance.  HENT:     Head: Normocephalic and atraumatic.  Eyes:     General: No scleral icterus.       Right eye: No discharge.        Left eye: No discharge.     Extraocular Movements: Extraocular movements intact.     Conjunctiva/sclera: Conjunctivae normal.     Pupils: Pupils are equal, round, and reactive to light.  Cardiovascular:     Rate and Rhythm: Normal rate.     Pulses: Normal pulses.  Musculoskeletal:     Comments: Abnormal alignment of the lumbar spine secondary to scoliosis pain in the right lower back and buttock  With normal sensation and strength in the right lower extremity straight leg raise negative  Skin:    General: Skin is warm and dry.     Capillary Refill: Capillary refill takes less than 2 seconds.  Neurological:     General: No focal deficit present.     Mental Status: She is alert and oriented to person, place, and  time.     Sensory: No sensory deficit.     Motor: No weakness.  Psychiatric:        Mood and Affect: Mood normal.        Behavior: Behavior normal.        Thought Content: Thought content normal.        Judgment: Judgment normal.     X-ray  Hip mild OA  Lumbar spine scoliosis lumbar degenerative disc disease facet arthritis and disc space narrowing spondylolisthesis grade 2 probably worse than the last x-ray in 2018   Assessment and plan   Encounter Diagnoses  Name Primary?   Pain in right hip    Lumbar pain    Radicular pain of right lower extremity Yes   Spondylolisthesis, lumbar region, chronic    I think this is acute radiculitis secondary to the spinal disease  Recommend the following medication  Meds ordered this encounter  Medications   tiZANidine (ZANAFLEX) 4 MG tablet    Sig: Take 1 tablet (4 mg total) by mouth every 6 (six) hours as  needed for muscle spasms.    Dispense:  30 tablet    Refill:  0   predniSONE (DELTASONE) 10 MG tablet    Sig: Take 1 tablet (10 mg total) by mouth 3 (three) times daily.    Dispense:  42 tablet    Refill:  0    Return in 2 weeks

## 2023-04-16 DIAGNOSIS — L820 Inflamed seborrheic keratosis: Secondary | ICD-10-CM | POA: Diagnosis not present

## 2023-04-16 DIAGNOSIS — L821 Other seborrheic keratosis: Secondary | ICD-10-CM | POA: Diagnosis not present

## 2023-04-19 ENCOUNTER — Ambulatory Visit (INDEPENDENT_AMBULATORY_CARE_PROVIDER_SITE_OTHER): Payer: Medicare Other | Admitting: Orthopedic Surgery

## 2023-04-19 ENCOUNTER — Encounter: Payer: Self-pay | Admitting: Orthopedic Surgery

## 2023-04-19 VITALS — BP 141/62 | HR 81 | Ht 61.0 in | Wt 123.0 lb

## 2023-04-19 DIAGNOSIS — M4316 Spondylolisthesis, lumbar region: Secondary | ICD-10-CM

## 2023-04-19 DIAGNOSIS — M5410 Radiculopathy, site unspecified: Secondary | ICD-10-CM | POA: Diagnosis not present

## 2023-04-19 NOTE — Progress Notes (Signed)
Follow-up visit   Encounter Diagnoses  Name Primary?   Radicular pain of right lower extremity Yes   Spondylolisthesis, lumbar region, chronic     89 year old female with radicular pain in the right leg treated with tizanidine and prednisone she says she feels great  No need to intervene at this time recommend routine follow-up as needed only bothers in the am

## 2023-05-22 DIAGNOSIS — Z823 Family history of stroke: Secondary | ICD-10-CM | POA: Diagnosis not present

## 2023-05-22 DIAGNOSIS — I10 Essential (primary) hypertension: Secondary | ICD-10-CM | POA: Diagnosis not present

## 2023-05-30 ENCOUNTER — Other Ambulatory Visit: Payer: Self-pay

## 2023-08-07 DIAGNOSIS — Z23 Encounter for immunization: Secondary | ICD-10-CM | POA: Diagnosis not present

## 2023-09-24 DIAGNOSIS — E039 Hypothyroidism, unspecified: Secondary | ICD-10-CM | POA: Diagnosis not present

## 2023-09-24 DIAGNOSIS — M810 Age-related osteoporosis without current pathological fracture: Secondary | ICD-10-CM | POA: Diagnosis not present

## 2023-09-24 DIAGNOSIS — R7303 Prediabetes: Secondary | ICD-10-CM | POA: Diagnosis not present

## 2023-09-24 DIAGNOSIS — K219 Gastro-esophageal reflux disease without esophagitis: Secondary | ICD-10-CM | POA: Diagnosis not present

## 2023-09-24 DIAGNOSIS — I10 Essential (primary) hypertension: Secondary | ICD-10-CM | POA: Diagnosis not present

## 2023-09-24 DIAGNOSIS — I6300 Cerebral infarction due to thrombosis of unspecified precerebral artery: Secondary | ICD-10-CM | POA: Diagnosis not present

## 2023-09-24 DIAGNOSIS — E876 Hypokalemia: Secondary | ICD-10-CM | POA: Diagnosis not present

## 2023-09-24 DIAGNOSIS — E785 Hyperlipidemia, unspecified: Secondary | ICD-10-CM | POA: Diagnosis not present

## 2023-09-24 DIAGNOSIS — F329 Major depressive disorder, single episode, unspecified: Secondary | ICD-10-CM | POA: Diagnosis not present

## 2023-09-24 DIAGNOSIS — Z79899 Other long term (current) drug therapy: Secondary | ICD-10-CM | POA: Diagnosis not present

## 2023-09-24 DIAGNOSIS — I2510 Atherosclerotic heart disease of native coronary artery without angina pectoris: Secondary | ICD-10-CM | POA: Diagnosis not present

## 2023-10-03 ENCOUNTER — Other Ambulatory Visit (HOSPITAL_COMMUNITY): Payer: Self-pay | Admitting: Internal Medicine

## 2023-10-03 DIAGNOSIS — E039 Hypothyroidism, unspecified: Secondary | ICD-10-CM | POA: Diagnosis not present

## 2023-10-03 DIAGNOSIS — E785 Hyperlipidemia, unspecified: Secondary | ICD-10-CM | POA: Diagnosis not present

## 2023-10-03 DIAGNOSIS — Z780 Asymptomatic menopausal state: Secondary | ICD-10-CM

## 2023-10-03 DIAGNOSIS — F3340 Major depressive disorder, recurrent, in remission, unspecified: Secondary | ICD-10-CM | POA: Diagnosis not present

## 2023-10-03 DIAGNOSIS — I10 Essential (primary) hypertension: Secondary | ICD-10-CM | POA: Diagnosis not present

## 2023-10-03 DIAGNOSIS — I480 Paroxysmal atrial fibrillation: Secondary | ICD-10-CM | POA: Diagnosis not present

## 2023-10-03 DIAGNOSIS — Z8673 Personal history of transient ischemic attack (TIA), and cerebral infarction without residual deficits: Secondary | ICD-10-CM | POA: Diagnosis not present

## 2023-10-04 ENCOUNTER — Other Ambulatory Visit: Payer: Self-pay

## 2023-10-04 ENCOUNTER — Telehealth: Payer: Self-pay

## 2023-10-04 NOTE — Telephone Encounter (Signed)
Auth Submission: NO AUTH NEEDED Site of care: Site of care: [facility] Payer: medicare a/b, bcbs supp Medication & CPT/J Code(s) submitted: Reclast (Zolendronic acid) [id number] Route of submission (phone, fax, portal): phone Phone # Fax # Auth type: Buy/Bill HB Units/visits requested: 1 dose Reference number:  Approval from: 10/04/23 to 11/13/23

## 2023-10-10 ENCOUNTER — Encounter: Payer: Self-pay | Admitting: Internal Medicine

## 2023-10-17 ENCOUNTER — Encounter: Payer: Medicare Other | Attending: Internal Medicine | Admitting: Internal Medicine

## 2023-10-17 VITALS — BP 141/75 | HR 73 | Temp 97.8°F | Resp 16

## 2023-10-17 DIAGNOSIS — M810 Age-related osteoporosis without current pathological fracture: Secondary | ICD-10-CM | POA: Insufficient documentation

## 2023-10-17 MED ORDER — ACETAMINOPHEN 325 MG PO TABS
650.0000 mg | ORAL_TABLET | Freq: Once | ORAL | Status: AC
  Administered 2023-10-17: 650 mg via ORAL

## 2023-10-17 MED ORDER — ZOLEDRONIC ACID 5 MG/100ML IV SOLN
5.0000 mg | Freq: Once | INTRAVENOUS | Status: AC
  Administered 2023-10-17: 5 mg via INTRAVENOUS

## 2023-10-17 MED ORDER — DIPHENHYDRAMINE HCL 25 MG PO CAPS
25.0000 mg | ORAL_CAPSULE | Freq: Once | ORAL | Status: AC
  Administered 2023-10-17: 25 mg via ORAL

## 2023-10-17 NOTE — Progress Notes (Signed)
Diagnosis: Osteoporosis  Provider:  Carylon Perches MD  Procedure: IV Infusion  IV Type: Peripheral, IV Location: L Antecubital  Reclast (Zolendronic Acid), Dose: 5 mg  Infusion Start Time: 1501  Infusion Stop Time: 1533  Post Infusion IV Care: Observation period completed  Discharge: Condition: Good, Destination: Home . AVS Provided  Performed by:  Cleotilde Neer, LPN

## 2023-10-18 NOTE — Addendum Note (Signed)
Addended by: Estanislado Emms T on: 10/18/2023 08:03 AM   Modules accepted: Orders

## 2023-12-28 DIAGNOSIS — Z79899 Other long term (current) drug therapy: Secondary | ICD-10-CM | POA: Diagnosis not present

## 2023-12-28 DIAGNOSIS — I10 Essential (primary) hypertension: Secondary | ICD-10-CM | POA: Diagnosis not present

## 2023-12-28 DIAGNOSIS — I6300 Cerebral infarction due to thrombosis of unspecified precerebral artery: Secondary | ICD-10-CM | POA: Diagnosis not present

## 2023-12-28 DIAGNOSIS — E785 Hyperlipidemia, unspecified: Secondary | ICD-10-CM | POA: Diagnosis not present

## 2023-12-28 DIAGNOSIS — I2510 Atherosclerotic heart disease of native coronary artery without angina pectoris: Secondary | ICD-10-CM | POA: Diagnosis not present

## 2024-01-07 DIAGNOSIS — E785 Hyperlipidemia, unspecified: Secondary | ICD-10-CM | POA: Diagnosis not present

## 2024-01-07 DIAGNOSIS — N3941 Urge incontinence: Secondary | ICD-10-CM | POA: Diagnosis not present

## 2024-01-07 DIAGNOSIS — I10 Essential (primary) hypertension: Secondary | ICD-10-CM | POA: Diagnosis not present

## 2024-02-18 DIAGNOSIS — L570 Actinic keratosis: Secondary | ICD-10-CM | POA: Diagnosis not present

## 2024-02-18 DIAGNOSIS — X32XXXD Exposure to sunlight, subsequent encounter: Secondary | ICD-10-CM | POA: Diagnosis not present

## 2024-02-18 DIAGNOSIS — L820 Inflamed seborrheic keratosis: Secondary | ICD-10-CM | POA: Diagnosis not present

## 2024-02-18 DIAGNOSIS — L821 Other seborrheic keratosis: Secondary | ICD-10-CM | POA: Diagnosis not present

## 2024-03-11 DIAGNOSIS — H04123 Dry eye syndrome of bilateral lacrimal glands: Secondary | ICD-10-CM | POA: Diagnosis not present

## 2024-04-21 DIAGNOSIS — I10 Essential (primary) hypertension: Secondary | ICD-10-CM | POA: Diagnosis not present

## 2024-04-21 DIAGNOSIS — N3941 Urge incontinence: Secondary | ICD-10-CM | POA: Diagnosis not present

## 2024-07-03 ENCOUNTER — Other Ambulatory Visit: Payer: Self-pay

## 2024-07-03 DIAGNOSIS — I6521 Occlusion and stenosis of right carotid artery: Secondary | ICD-10-CM

## 2024-07-18 DIAGNOSIS — M810 Age-related osteoporosis without current pathological fracture: Secondary | ICD-10-CM | POA: Diagnosis not present

## 2024-07-18 DIAGNOSIS — M25561 Pain in right knee: Secondary | ICD-10-CM | POA: Diagnosis not present

## 2024-08-03 ENCOUNTER — Encounter (HOSPITAL_COMMUNITY): Payer: Self-pay

## 2024-08-03 ENCOUNTER — Emergency Department (HOSPITAL_COMMUNITY)
Admission: EM | Admit: 2024-08-03 | Discharge: 2024-08-03 | Disposition: A | Discharge status: Home / Self Care | Source: Other Acute Inpatient Hospital | Attending: Emergency Medicine | Admitting: Emergency Medicine

## 2024-08-03 ENCOUNTER — Other Ambulatory Visit: Payer: Self-pay

## 2024-08-03 ENCOUNTER — Emergency Department (HOSPITAL_COMMUNITY)

## 2024-08-03 DIAGNOSIS — Z85828 Personal history of other malignant neoplasm of skin: Secondary | ICD-10-CM | POA: Insufficient documentation

## 2024-08-03 DIAGNOSIS — I10 Essential (primary) hypertension: Secondary | ICD-10-CM | POA: Insufficient documentation

## 2024-08-03 DIAGNOSIS — M8580 Other specified disorders of bone density and structure, unspecified site: Secondary | ICD-10-CM | POA: Diagnosis not present

## 2024-08-03 DIAGNOSIS — E039 Hypothyroidism, unspecified: Secondary | ICD-10-CM | POA: Diagnosis not present

## 2024-08-03 DIAGNOSIS — M4317 Spondylolisthesis, lumbosacral region: Secondary | ICD-10-CM | POA: Diagnosis not present

## 2024-08-03 DIAGNOSIS — Z79899 Other long term (current) drug therapy: Secondary | ICD-10-CM | POA: Insufficient documentation

## 2024-08-03 DIAGNOSIS — M25552 Pain in left hip: Secondary | ICD-10-CM | POA: Diagnosis present

## 2024-08-03 DIAGNOSIS — Z8673 Personal history of transient ischemic attack (TIA), and cerebral infarction without residual deficits: Secondary | ICD-10-CM | POA: Insufficient documentation

## 2024-08-03 DIAGNOSIS — M19071 Primary osteoarthritis, right ankle and foot: Secondary | ICD-10-CM | POA: Diagnosis not present

## 2024-08-03 DIAGNOSIS — M4186 Other forms of scoliosis, lumbar region: Secondary | ICD-10-CM | POA: Diagnosis not present

## 2024-08-03 DIAGNOSIS — M1612 Unilateral primary osteoarthritis, left hip: Secondary | ICD-10-CM | POA: Diagnosis not present

## 2024-08-03 DIAGNOSIS — Z7982 Long term (current) use of aspirin: Secondary | ICD-10-CM | POA: Insufficient documentation

## 2024-08-03 DIAGNOSIS — M160 Bilateral primary osteoarthritis of hip: Secondary | ICD-10-CM | POA: Insufficient documentation

## 2024-08-03 DIAGNOSIS — M25571 Pain in right ankle and joints of right foot: Secondary | ICD-10-CM | POA: Diagnosis not present

## 2024-08-03 DIAGNOSIS — M25551 Pain in right hip: Secondary | ICD-10-CM

## 2024-08-03 DIAGNOSIS — M5136 Other intervertebral disc degeneration, lumbar region with discogenic back pain only: Secondary | ICD-10-CM | POA: Diagnosis not present

## 2024-08-03 DIAGNOSIS — M1990 Unspecified osteoarthritis, unspecified site: Secondary | ICD-10-CM

## 2024-08-03 DIAGNOSIS — M1611 Unilateral primary osteoarthritis, right hip: Secondary | ICD-10-CM | POA: Diagnosis not present

## 2024-08-03 MED ORDER — TRAMADOL HCL 50 MG PO TABS: 50.0000 mg | ORAL_TABLET | Freq: Four times a day (QID) | ORAL | 0 refills | Status: DC | PRN

## 2024-08-03 MED ORDER — METHYLPREDNISOLONE 4 MG PO TBPK: ORAL_TABLET | Freq: Every day | ORAL | 0 refills | Status: DC

## 2024-08-03 MED ORDER — TRAMADOL HCL 50 MG PO TABS
50.0000 mg | ORAL_TABLET | Freq: Once | ORAL | Status: AC
  Administered 2024-08-03: 50 mg via ORAL
  Filled 2024-08-03: qty 1

## 2024-08-03 MED ORDER — DEXAMETHASONE SODIUM PHOSPHATE 10 MG/ML IJ SOLN
8.0000 mg | Freq: Once | INTRAMUSCULAR | Status: AC
  Administered 2024-08-03: 8 mg via INTRAMUSCULAR
  Filled 2024-08-03: qty 1

## 2024-08-03 NOTE — ED Triage Notes (Signed)
 Pt stated that she was recently diagnosed with a bone spur in her right knee on Sept 5th, but is now complaining of bilat hip pain and right ankle pain. Pt stated that she can walk on them but not without having intense pain.

## 2024-08-03 NOTE — ED Provider Notes (Signed)
 [facility] AT [facility] Provider Note   CSN: [id number] Arrival date & time: 08/03/24  1041     Patient presents with: Hip Pain and Ankle Pain   Sheila Briggs is a 89 y.o. female.   Pt is a 89 yo female with pmhx significant for hypothyroidism, gerd, htn, hld, skin cancer, cva, and arthritis.  Pt said she injured her right knee a few weeks ago.  She now has bilateral hip pain and right ankle pain.  She said it hurts to walk.  No falls.  She has an appt with ortho on 10/1, but did not think she could wait that long.        Prior to Admission medications   Medication Sig Start Date End Date Taking? Authorizing Provider  methylPREDNISolone  (MEDROL  DOSEPAK) 4 MG TBPK tablet Take by mouth daily. Day 1:  2 pills at breakfast, 1 pill at lunch, 1 pill after supper, 2 pills at bedtime;Day 2:  1 pill at breakfast, 1 pill at lunch, 1 pill after supper, 2 pills at bedtime;Day 3:  1 pill at breakfast, 1 pill at lunch, 1 pill after supper, 1 pill at bedtime;Day 4:  1 pill at breakfast, 1 pill at lunch, 1 pill at bedtime;Day 5:  1 pill at breakfast, 1 pill at bedtime;Day 6:  1 pill at breakfast 08/03/24  Yes Dean Clarity, MD  traMADol  (ULTRAM ) 50 MG tablet Take 1 tablet (50 mg total) by mouth every 6 (six) hours as needed. 08/03/24  Yes Dean Clarity, MD  amLODipine  (NORVASC ) 5 MG tablet Take 5 mg by mouth daily. 11/01/22   [provider]  aspirin  EC 81 MG tablet Take 301 mg by mouth daily.     [provider]  atorvastatin  (LIPITOR) 20 MG tablet Take 20 mg by mouth daily. 01/30/20   [provider]  chlorthalidone (HYGROTON) 25 MG tablet Take 25 mg by mouth daily.    [provider]  citalopram  (CELEXA ) 10 MG tablet Take 5 mg by mouth daily.    [provider]  hydrochlorothiazide  (HYDRODIURIL ) 12.5 MG tablet Take 25 mg by mouth daily.  03/20/11   [provider]  levothyroxine  (SYNTHROID , LEVOTHROID) 25 MCG  tablet Take 25 mcg by mouth daily before breakfast.    [provider]  Multiple Vitamin (MULTIVITAMIN WITH MINERALS) TABS Take 1 tablet by mouth daily.    [provider]  olmesartan (BENICAR) 20 MG tablet  10/27/17   [provider]  pantoprazole  (PROTONIX ) 40 MG tablet TK ONE T PO D 08/07/19   [provider]  Polyethyl Glycol-Propyl Glycol (SYSTANE ULTRA OP) Apply 1 drop to eye 4 (four) times daily.    [provider]  potassium chloride  SA (K-DUR,KLOR-CON ) 20 MEQ tablet Take 1 tablet (20 mEq total) by mouth 2 (two) times daily. 08/03/16   Mesner, Selinda, MD  tiZANidine  (ZANAFLEX ) 4 MG tablet Take 1 tablet (4 mg total) by mouth every 6 (six) hours as needed for muscle spasms. Patient not taking: Reported on 04/19/2023 04/02/23   Margrette Taft BRAVO, MD  traMADol  (ULTRAM ) 50 MG tablet Take 1 tablet (50 mg total) by mouth every 8 (eight) hours as needed. 01/13/16   Rhyne, Samantha J, PA-C    Allergies: Patient has no known allergies.    Review of Systems  Musculoskeletal:        Bilateral hip and right ankle pain  All other systems reviewed and are negative.   Updated Vital  Signs BP (!) 140/113 (BP Location: Right Arm)   Pulse (!) 105   Temp 98.1 F (36.7 C)   Resp 20   Ht 5' 1 (1.549 m)   Wt 55.8 kg   SpO2 92%   BMI 23.24 kg/m   Physical Exam Vitals and nursing note reviewed.  Constitutional:      Appearance: Normal appearance.  HENT:     Head: Normocephalic and atraumatic.     Right Ear: External ear normal.     Left Ear: External ear normal.     Nose: Nose normal.     Mouth/Throat:     Mouth: Mucous membranes are moist.     Pharynx: Oropharynx is clear.  Eyes:     Extraocular Movements: Extraocular movements intact.     Conjunctiva/sclera: Conjunctivae normal.     Pupils: Pupils are equal, round, and reactive to light.  Cardiovascular:     Rate and Rhythm: Normal rate and regular rhythm.     Pulses: Normal pulses.     Heart  sounds: Normal heart sounds.  Pulmonary:     Effort: Pulmonary effort is normal.     Breath sounds: Normal breath sounds.  Abdominal:     General: Abdomen is flat. Bowel sounds are normal.     Palpations: Abdomen is soft.  Musculoskeletal:     Cervical back: Normal range of motion and neck supple.     Comments: Tenderness to palpation bilateral hips and right ankle.  No deformities.  No redness or swelling.  Skin:    General: Skin is warm.     Capillary Refill: Capillary refill takes less than 2 seconds.  Neurological:     General: No focal deficit present.     Mental Status: She is alert and oriented to person, place, and time.  Psychiatric:        Mood and Affect: Mood normal.        Behavior: Behavior normal.     (all labs ordered are listed, but only abnormal results are displayed) Labs Reviewed - No data to display  EKG: None  Radiology: DG Hip Unilat W or Wo Pelvis 2-3 Views Right Result Date: 08/03/2024 CLINICAL DATA:  Hip pain EXAM: DG HIP (WITH OR WITHOUT PELVIS) 2-3V RIGHT COMPARISON:  04/02/2023 FINDINGS: Osteopenia. SI joints and symphysis pubis unremarkable. Mild loss of joint space noted right hip. No femoral neck fracture. No worrisome lytic or sclerotic osseous abnormality. IMPRESSION: Mild degenerative changes in the right hip. No acute bony findings. Electronically Signed   By: Camellia Candle M.D.   On: 08/03/2024 12:29   DG Lumbar Spine Complete Result Date: 08/03/2024 CLINICAL DATA:  Pain. EXAM: LUMBAR SPINE - COMPLETE 4+ VIEW COMPARISON:  04/02/2023 FINDINGS: Diffuse bony demineralization limits assessment. Within this limitation there is no evidence for lumbar spine fracture. Diffuse loss of intervertebral disc height is noted throughout the lumbar spine. Anterolisthesis of L5 on S1 is stable. Mild convex leftward lower lumbar scoliosis. SI joints unremarkable. IMPRESSION: Diffuse bony demineralization with multilevel degenerative disc disease. No evidence for  lumbar spine fracture. Electronically Signed   By: Camellia Candle M.D.   On: 08/03/2024 12:28   DG Ankle Complete Right Result Date: 08/03/2024 CLINICAL DATA:  Right ankle pain. EXAM: RIGHT ANKLE - COMPLETE 3+ VIEW COMPARISON:  None Available. FINDINGS: Ankle mortise is normal. No acute fracture or dislocation. Very minimal degenerative changes over the midfoot/hindfoot region. Soft tissues are unremarkable. IMPRESSION: No acute findings. Electronically Signed   By: Toribio  Jearld M.D.   On: 08/03/2024 12:28   DG Hip Unilat W or Wo Pelvis 2-3 Views Left Result Date: 08/03/2024 CLINICAL DATA:  Hip pain EXAM: DG HIP (WITH OR WITHOUT PELVIS) 2-3V LEFT COMPARISON:  None Available. FINDINGS: No fracture. No dislocation. Loss of joint space noted in the hip compatible with mild degenerative change. No worrisome lytic or sclerotic osseous abnormality. IMPRESSION: Mild degenerative changes in the left hip. No acute bony findings. Electronically Signed   By: Camellia Candle M.D.   On: 08/03/2024 12:25     Procedures   Medications Ordered in the ED  traMADol  (ULTRAM ) tablet 50 mg (50 mg Oral Given 08/03/24 1228)  dexamethasone  (DECADRON ) injection 8 mg (8 mg Intramuscular Given 08/03/24 1229)                                    Medical Decision Making Amount and/or Complexity of Data Reviewed Radiology: ordered.  Risk Prescription drug management.   This patient presents to the ED for concern of hip and ankle pain, this involves an extensive number of treatment options, and is a complaint that carries with it a high risk of complications and morbidity.  The differential diagnosis includes arthritis, fx, strain   Co morbidities that complicate the patient evaluation  hypothyroidism, gerd, htn, hld, skin cancer, cva, and arthritis   Additional history obtained:  Additional history obtained from epic chart review External records from outside source obtained and reviewed including  grandson   Imaging Studies ordered:  I ordered imaging studies including bilateral hip and right ankle and back  I independently visualized and interpreted imaging which showed  R hip: Mild degenerative changes in the right hip. No acute bony findings.  Lumbar: Diffuse bony demineralization with multilevel degenerative disc  disease. No evidence for lumbar spine fracture.  R ankle: No acute findings.  L hip: Mild degenerative changes in the left hip. No acute bony findings.  I agree with the radiologist interpretation  Medicines ordered and prescription drug management:  I ordered medication including tramadol  and decadron   for sx  Reevaluation of the patient after these medicines showed that the patient improved I have reviewed the patients home medicines and have made adjustments as needed   Test Considered:  xr   Critical Interventions:  Pain control   Problem List / ED Course:  Hip and ankle pain:  likely due to the knee injury causing her to alter her gait.  Pain has improved.  She is stable for d/c.  She is to f/u with ortho.  Return if worse.     Reevaluation:  After the interventions noted above, I reevaluated the patient and found that they have :improved   Social Determinants of Health:  Lives alone   Dispostion:  After consideration of the diagnostic results and the patients response to treatment, I feel that the patent would benefit from discharge with outpatient f/u.       Final diagnoses:  Bilateral hip pain  Osteoarthritis, unspecified osteoarthritis type, unspecified site    ED Discharge Orders          Ordered    traMADol  (ULTRAM ) 50 MG tablet  Every 6 hours PRN        08/03/24 1256    methylPREDNISolone  (MEDROL  DOSEPAK) 4 MG TBPK tablet  Daily        08/03/24 1256  Dean Clarity, MD 08/03/24 1556

## 2024-08-06 DIAGNOSIS — M7072 Other bursitis of hip, left hip: Secondary | ICD-10-CM | POA: Diagnosis not present

## 2024-08-06 DIAGNOSIS — M7071 Other bursitis of hip, right hip: Secondary | ICD-10-CM | POA: Diagnosis not present

## 2024-08-06 DIAGNOSIS — M160 Bilateral primary osteoarthritis of hip: Secondary | ICD-10-CM | POA: Diagnosis not present

## 2024-08-06 DIAGNOSIS — M4316 Spondylolisthesis, lumbar region: Secondary | ICD-10-CM | POA: Diagnosis not present

## 2024-08-07 ENCOUNTER — Ambulatory Visit

## 2024-08-07 ENCOUNTER — Encounter (HOSPITAL_COMMUNITY)

## 2024-08-12 ENCOUNTER — Ambulatory Visit (HOSPITAL_COMMUNITY)

## 2024-08-12 ENCOUNTER — Ambulatory Visit

## 2024-08-13 ENCOUNTER — Ambulatory Visit (INDEPENDENT_AMBULATORY_CARE_PROVIDER_SITE_OTHER): Admitting: Orthopedic Surgery

## 2024-08-13 DIAGNOSIS — M48061 Spinal stenosis, lumbar region without neurogenic claudication: Secondary | ICD-10-CM

## 2024-08-13 DIAGNOSIS — M545 Low back pain, unspecified: Secondary | ICD-10-CM

## 2024-08-13 DIAGNOSIS — M5410 Radiculopathy, site unspecified: Secondary | ICD-10-CM

## 2024-08-13 MED ORDER — TIZANIDINE HCL 2 MG PO TABS: 2.0000 mg | ORAL_TABLET | Freq: Four times a day (QID) | ORAL | 0 refills | Status: DC | PRN

## 2024-08-13 MED ORDER — ACETAMINOPHEN 500 MG PO TABS: 500.0000 mg | ORAL_TABLET | Freq: Four times a day (QID) | ORAL | 0 refills | Status: AC | PRN

## 2024-08-13 MED ORDER — TRAMADOL HCL 50 MG PO TABS: 50.0000 mg | ORAL_TABLET | Freq: Four times a day (QID) | ORAL | 0 refills | Status: DC | PRN

## 2024-08-13 MED ORDER — PREDNISONE 10 MG (48) PO TBPK: ORAL_TABLET | Freq: Every day | ORAL | 0 refills | Status: DC

## 2024-08-13 NOTE — Progress Notes (Signed)
  Intake history:  Chief Complaint  Patient presents with   Back Pain    Right sided pain goes down in back of right leg been going on for a bout two weeks went to ER patient in severe pain today.     There were no vitals taken for this visit. There is no height or weight on file to calculate BMI.  Pharmacy? _____[facility]_________________________________  WHAT ARE WE SEEING YOU FOR TODAY?   back - entire back  Radiation?: yes -right leg.   Loss of bowel/urine control?  no  How Sheila Briggs has this bothered you? (DOI?DOS?WS?)  2-3 week(s) ago  Was there an injury? No  Anticoag.  Yes ASA 81 mg  Diabetes No  Heart disease No  Hypertension Yes  SMOKING HX No  Kidney disease No  Any ALLERGIES ______________________________________________   Treatment:  Have you taken:  Tylenol  Yes  Advil Yes  Had PT No  Had injection Yes  Other  __________steroid and pain_______________

## 2024-08-13 NOTE — Progress Notes (Signed)
   Chief Complaint  Patient presents with   Back Pain    Right sided pain goes down in back of right leg been going on for a bout two weeks went to ER patient in severe pain today.   History.  89 year old female with progressively worsening right sided lower back pain rating down the right leg for about 2 weeks.  The patient has been seen by urgent care and I believe an ER visit and was put on prednisone  tizanidine  tramadol  and has taken Tylenol .  Her pain is worsening  Review of systems bowel and bladder function remain intact   Exam The patient is more comfortable lying flat on her back.  She is having difficulty walking and uses a walker for assistance.  She has tenderness in her lower back she has a positive straight leg raise bilaterally at 45 degrees  Her hip range of motion is normal both hips are stable leg lengths are equal muscle tone and strength are normal in both lower extremities there are no skin lesions in the lower extremities  She appears abnormal sensation to palpation such as pressure and soft touch she is oriented x 3 mood and affect ESIs   Assessment and plan  Encounter Diagnoses  Name Primary?   Radicular pain of right lower extremity    Spinal stenosis of lumbar region, unspecified whether neurogenic claudication present Yes   Lumbar pain     Recommend MRI, expect ESI as needed     Past Medical History:  Diagnosis Date   Anxiety    Arthritis    Cancer (HCC)    basal cell of scalp   GERD (gastroesophageal reflux disease)    HOH (hard of hearing)    Hypercholesteremia    Hypertension    Hypothyroidism    Stroke (HCC) 2015   mild   Thyroid  disease     Past Surgical History:  Procedure Laterality Date   ABDOMINAL HYSTERECTOMY     APPENDECTOMY     BIOPSY THYROID      benign   CATARACT EXTRACTION W/PHACO  12/19/2012   Procedure: CATARACT EXTRACTION PHACO AND INTRAOCULAR LENS PLACEMENT (IOC);  Surgeon: Cherene Mania, MD;  Location: [facility];  Service:  Ophthalmology;  Laterality: Left;  CDE 13.75   CATARACT EXTRACTION W/PHACO Right 01/06/2013   Procedure: CATARACT EXTRACTION PHACO AND INTRAOCULAR LENS PLACEMENT (IOC);  Surgeon: Cherene Mania, MD;  Location: [facility];  Service: Ophthalmology;  Laterality: Right;  CDE: 9.96   CHOLECYSTECTOMY     ENDARTERECTOMY Right 01/13/2016   Procedure: ENDARTERECTOMY CAROTID RIGHT;  Surgeon: Lonni GORMAN Blade, MD;  Location: [facility] OR;  Service: Vascular;  Laterality: Right;   PATCH ANGIOPLASTY  01/13/2016   Procedure: RIGHT CAROTID ARTERY PATCH ANGIOPLASTY USING 1CM X 6CM XENOSURE BIOLOGIC PATCH;  Surgeon: Lonni GORMAN Blade, MD;  Location: [facility] OR;  Service: Vascular;;   TONSILLECTOMY      No family history on file.  Social History   Tobacco Use   Smoking status: Never   Smokeless tobacco: Never  Vaping Use   Vaping status: Never Used  Substance Use Topics   Alcohol  use: No   Drug use: No    No Known Allergies

## 2024-08-13 NOTE — Patient Instructions (Signed)
 While we are working on your approval for MRI please go ahead and call to schedule your appointment with [facility] within at least one (1) week.   Central Scheduling [phone]

## 2024-08-14 ENCOUNTER — Telehealth: Payer: Self-pay | Admitting: Orthopedic Surgery

## 2024-08-14 NOTE — Telephone Encounter (Signed)
 No   She has to have mri before the injections

## 2024-08-14 NOTE — Telephone Encounter (Signed)
 I called to advise. MRI is scheduled for 08/18/24

## 2024-08-14 NOTE — Telephone Encounter (Signed)
 Dr. Areatha pt - spoke w/the pt's son Garrel [phone], he stated the pt was seen yesterday and was being referred to [facility] for infusions.  He stated they haven't heard anything yet and she's in a lot of pain.  He would like the information on where she's being sent so he can call himself.

## 2024-08-18 ENCOUNTER — Ambulatory Visit (HOSPITAL_COMMUNITY)
Admission: RE | Admit: 2024-08-18 | Discharge: 2024-08-18 | Disposition: A | Discharge status: Home / Self Care | Source: Ambulatory Visit | Attending: Orthopedic Surgery | Admitting: Orthopedic Surgery

## 2024-08-18 DIAGNOSIS — M545 Low back pain, unspecified: Secondary | ICD-10-CM | POA: Diagnosis present

## 2024-08-18 DIAGNOSIS — M48061 Spinal stenosis, lumbar region without neurogenic claudication: Secondary | ICD-10-CM | POA: Diagnosis not present

## 2024-08-18 DIAGNOSIS — M5410 Radiculopathy, site unspecified: Secondary | ICD-10-CM | POA: Insufficient documentation

## 2024-08-18 DIAGNOSIS — M4316 Spondylolisthesis, lumbar region: Secondary | ICD-10-CM

## 2024-08-18 DIAGNOSIS — M8448XA Pathological fracture, other site, initial encounter for fracture: Secondary | ICD-10-CM | POA: Diagnosis not present

## 2024-08-21 ENCOUNTER — Telehealth: Payer: Self-pay

## 2024-08-21 DIAGNOSIS — M4807 Spinal stenosis, lumbosacral region: Secondary | ICD-10-CM

## 2024-08-21 NOTE — Telephone Encounter (Signed)
 Patient had her MRI done Monday and they have already read it. Please look at her MRI. When can I put her on to come in and see you?   Please advise

## 2024-08-25 NOTE — Telephone Encounter (Signed)
-----   Message from Waldo sent at 08/22/2024 12:45 PM EDT ----- Referral to Dr. Eldonna for L5-S1 foraminal injections  Diagnosis sacral insufficiency fractures and severe foraminal stenosis at L5-S1

## 2024-08-28 ENCOUNTER — Telehealth: Payer: Self-pay

## 2024-08-28 NOTE — Telephone Encounter (Signed)
 Garrel called back said he was sorry he missed your call to please give him another call when you can.  [phone]

## 2024-08-28 NOTE — Telephone Encounter (Signed)
 Patient's son Darrel) came into the office this morning wants to get some advice about his mom. Stated that she slid out of her chair this morning and it scared her to the point she is wanting to go to a rehab place to like [facility] or [facility] for a few weeks to get some help/treatment/pt. He stated that he doesn't think she is getting any better and he is asking for help.  He would like for you to please give him a call at [phone] and discuss this with him.

## 2024-08-30 DIAGNOSIS — M48061 Spinal stenosis, lumbar region without neurogenic claudication: Secondary | ICD-10-CM | POA: Diagnosis not present

## 2024-08-30 DIAGNOSIS — M160 Bilateral primary osteoarthritis of hip: Secondary | ICD-10-CM | POA: Diagnosis not present

## 2024-08-30 DIAGNOSIS — M7072 Other bursitis of hip, left hip: Secondary | ICD-10-CM | POA: Diagnosis not present

## 2024-08-30 DIAGNOSIS — M7071 Other bursitis of hip, right hip: Secondary | ICD-10-CM | POA: Diagnosis not present

## 2024-09-09 DIAGNOSIS — R262 Difficulty in walking, not elsewhere classified: Secondary | ICD-10-CM | POA: Diagnosis not present

## 2024-09-09 DIAGNOSIS — Z23 Encounter for immunization: Secondary | ICD-10-CM | POA: Diagnosis not present

## 2024-09-09 DIAGNOSIS — R102 Pelvic and perineal pain unspecified side: Secondary | ICD-10-CM | POA: Diagnosis not present

## 2024-09-09 DIAGNOSIS — N3941 Urge incontinence: Secondary | ICD-10-CM | POA: Diagnosis not present

## 2024-09-22 ENCOUNTER — Other Ambulatory Visit (HOSPITAL_COMMUNITY): Payer: Self-pay | Admitting: Internal Medicine

## 2024-09-23 ENCOUNTER — Telehealth: Payer: Self-pay

## 2024-09-23 NOTE — Telephone Encounter (Signed)
 Auth Submission: NO AUTH NEEDED Site of care: Site of care: [facility] Payer: medicare a/b, bcbs supp Medication & CPT/J Code(s) submitted: Reclast  (Zolendronic acid) [id number] Diagnosis Code:  Route of submission (phone, fax, portal): portal Phone # Fax # Auth type: Buy/Bill HB Units/visits requested: 5mg  x 1 dose Reference number:  Approval from: 09/23/24 to 11/12/24

## 2024-10-14 DIAGNOSIS — M6281 Muscle weakness (generalized): Secondary | ICD-10-CM | POA: Diagnosis not present

## 2024-10-14 DIAGNOSIS — E039 Hypothyroidism, unspecified: Secondary | ICD-10-CM | POA: Diagnosis not present

## 2024-10-14 DIAGNOSIS — R278 Other lack of coordination: Secondary | ICD-10-CM | POA: Diagnosis not present

## 2024-10-14 DIAGNOSIS — E785 Hyperlipidemia, unspecified: Secondary | ICD-10-CM | POA: Diagnosis not present

## 2024-10-14 DIAGNOSIS — F329 Major depressive disorder, single episode, unspecified: Secondary | ICD-10-CM | POA: Diagnosis not present

## 2024-10-14 DIAGNOSIS — Z79899 Other long term (current) drug therapy: Secondary | ICD-10-CM | POA: Diagnosis not present

## 2024-10-14 DIAGNOSIS — E876 Hypokalemia: Secondary | ICD-10-CM | POA: Diagnosis not present

## 2024-10-14 DIAGNOSIS — I10 Essential (primary) hypertension: Secondary | ICD-10-CM | POA: Diagnosis not present

## 2024-10-14 DIAGNOSIS — I6300 Cerebral infarction due to thrombosis of unspecified precerebral artery: Secondary | ICD-10-CM | POA: Diagnosis not present

## 2024-10-15 DIAGNOSIS — R278 Other lack of coordination: Secondary | ICD-10-CM | POA: Diagnosis not present

## 2024-10-15 DIAGNOSIS — M6281 Muscle weakness (generalized): Secondary | ICD-10-CM | POA: Diagnosis not present

## 2024-10-17 DIAGNOSIS — R278 Other lack of coordination: Secondary | ICD-10-CM | POA: Diagnosis not present

## 2024-10-17 DIAGNOSIS — M6281 Muscle weakness (generalized): Secondary | ICD-10-CM | POA: Diagnosis not present

## 2024-10-21 ENCOUNTER — Ambulatory Visit: Payer: Medicare Other

## 2024-10-21 DIAGNOSIS — M6281 Muscle weakness (generalized): Secondary | ICD-10-CM | POA: Diagnosis not present

## 2024-10-21 DIAGNOSIS — R278 Other lack of coordination: Secondary | ICD-10-CM | POA: Diagnosis not present

## 2024-10-22 ENCOUNTER — Observation Stay (HOSPITAL_COMMUNITY)
Admission: EM | Admit: 2024-10-22 | Discharge: 2024-10-23 | Disposition: A | Discharge status: Skilled Nursing Facility | Source: Ambulatory Visit | Attending: Internal Medicine | Admitting: Internal Medicine

## 2024-10-22 ENCOUNTER — Encounter (HOSPITAL_COMMUNITY): Payer: Self-pay | Admitting: *Deleted

## 2024-10-22 ENCOUNTER — Encounter: Payer: Self-pay | Admitting: Internal Medicine

## 2024-10-22 ENCOUNTER — Other Ambulatory Visit (HOSPITAL_COMMUNITY): Payer: Self-pay

## 2024-10-22 ENCOUNTER — Emergency Department (HOSPITAL_COMMUNITY)

## 2024-10-22 ENCOUNTER — Other Ambulatory Visit: Payer: Self-pay

## 2024-10-22 ENCOUNTER — Telehealth (HOSPITAL_COMMUNITY): Payer: Self-pay

## 2024-10-22 DIAGNOSIS — E785 Hyperlipidemia, unspecified: Secondary | ICD-10-CM | POA: Diagnosis not present

## 2024-10-22 DIAGNOSIS — F418 Other specified anxiety disorders: Secondary | ICD-10-CM | POA: Diagnosis not present

## 2024-10-22 DIAGNOSIS — Z79899 Other long term (current) drug therapy: Secondary | ICD-10-CM | POA: Diagnosis not present

## 2024-10-22 DIAGNOSIS — R002 Palpitations: Secondary | ICD-10-CM | POA: Diagnosis present

## 2024-10-22 DIAGNOSIS — K219 Gastro-esophageal reflux disease without esophagitis: Secondary | ICD-10-CM | POA: Diagnosis not present

## 2024-10-22 DIAGNOSIS — I4891 Unspecified atrial fibrillation: Secondary | ICD-10-CM | POA: Diagnosis not present

## 2024-10-22 DIAGNOSIS — R0602 Shortness of breath: Secondary | ICD-10-CM | POA: Diagnosis not present

## 2024-10-22 DIAGNOSIS — Z85828 Personal history of other malignant neoplasm of skin: Secondary | ICD-10-CM | POA: Diagnosis not present

## 2024-10-22 DIAGNOSIS — I10 Essential (primary) hypertension: Secondary | ICD-10-CM | POA: Diagnosis present

## 2024-10-22 DIAGNOSIS — E782 Mixed hyperlipidemia: Secondary | ICD-10-CM

## 2024-10-22 DIAGNOSIS — E039 Hypothyroidism, unspecified: Secondary | ICD-10-CM | POA: Diagnosis not present

## 2024-10-22 DIAGNOSIS — E038 Other specified hypothyroidism: Secondary | ICD-10-CM | POA: Diagnosis not present

## 2024-10-22 DIAGNOSIS — I517 Cardiomegaly: Secondary | ICD-10-CM | POA: Diagnosis not present

## 2024-10-22 DIAGNOSIS — J40 Bronchitis, not specified as acute or chronic: Secondary | ICD-10-CM | POA: Diagnosis not present

## 2024-10-22 LAB — MAGNESIUM: Magnesium: 1.9 mg/dL (ref 1.7–2.4)

## 2024-10-22 LAB — CBC
HCT: 44.2 % (ref 36.0–46.0)
Hemoglobin: 14.3 g/dL (ref 12.0–15.0)
MCH: 30.3 pg (ref 26.0–34.0)
MCHC: 32.4 g/dL (ref 30.0–36.0)
MCV: 93.6 fL (ref 80.0–100.0)
Platelets: 454 K/uL — ABNORMAL HIGH (ref 150–400)
RBC: 4.72 MIL/uL (ref 3.87–5.11)
RDW: 14.8 % (ref 11.5–15.5)
WBC: 10.3 K/uL (ref 4.0–10.5)
nRBC: 0 % (ref 0.0–0.2)

## 2024-10-22 LAB — BASIC METABOLIC PANEL WITH GFR
Anion gap: 16 — ABNORMAL HIGH (ref 5–15)
BUN: 21 mg/dL (ref 8–23)
CO2: 23 mmol/L (ref 22–32)
Calcium: 9.7 mg/dL (ref 8.9–10.3)
Chloride: 97 mmol/L — ABNORMAL LOW (ref 98–111)
Creatinine, Ser: 0.73 mg/dL (ref 0.44–1.00)
GFR, Estimated: >60 mL/min (ref >60)
Glucose, Bld: 119 mg/dL — ABNORMAL HIGH (ref 70–99)
Potassium: 4.1 mmol/L (ref 3.5–5.1)
Sodium: 135 mmol/L (ref 135–145)

## 2024-10-22 LAB — MRSA NEXT GEN BY PCR, NASAL: MRSA by PCR Next Gen: NOT DETECTED

## 2024-10-22 LAB — TSH: TSH: 1.6 u[IU]/mL (ref 0.350–4.500)

## 2024-10-22 MED ORDER — POLYETHYL GLYCOL-PROPYL GLYCOL 0.4-0.3 % OP SOLN: Freq: Four times a day (QID) | OPHTHALMIC | Status: DC

## 2024-10-22 MED ORDER — CHLORHEXIDINE GLUCONATE CLOTH 2 % EX PADS
6.0000 | MEDICATED_PAD | Freq: Every day | CUTANEOUS | Status: DC
  Administered 2024-10-23: 6 via TOPICAL

## 2024-10-22 MED ORDER — ACETAMINOPHEN 650 MG RE SUPP: 650.0000 mg | Freq: Four times a day (QID) | RECTAL | Status: DC | PRN

## 2024-10-22 MED ORDER — APIXABAN 2.5 MG PO TABS
2.5000 mg | ORAL_TABLET | Freq: Two times a day (BID) | ORAL | Status: DC
  Administered 2024-10-22 – 2024-10-23 (×3): 2.5 mg via ORAL
  Filled 2024-10-22 (×3): qty 1

## 2024-10-22 MED ORDER — ASPIRIN 81 MG PO TBEC
81.0000 mg | DELAYED_RELEASE_TABLET | Freq: Every day | ORAL | Status: DC
  Administered 2024-10-23: 81 mg via ORAL
  Filled 2024-10-22: qty 1

## 2024-10-22 MED ORDER — PANTOPRAZOLE SODIUM 40 MG PO TBEC
40.0000 mg | DELAYED_RELEASE_TABLET | Freq: Every day | ORAL | Status: DC
  Administered 2024-10-23: 40 mg via ORAL
  Filled 2024-10-22: qty 1

## 2024-10-22 MED ORDER — ONDANSETRON HCL 4 MG PO TABS: 4.0000 mg | ORAL_TABLET | Freq: Four times a day (QID) | ORAL | Status: DC | PRN

## 2024-10-22 MED ORDER — GUAIFENESIN-DM 100-10 MG/5ML PO SYRP
5.0000 mL | ORAL_SOLUTION | ORAL | Status: DC | PRN
  Administered 2024-10-22 – 2024-10-23 (×3): 5 mL via ORAL
  Filled 2024-10-22 (×3): qty 5

## 2024-10-22 MED ORDER — ATORVASTATIN CALCIUM 10 MG PO TABS
20.0000 mg | ORAL_TABLET | Freq: Every day | ORAL | Status: DC
  Administered 2024-10-23: 20 mg via ORAL
  Filled 2024-10-22: qty 2

## 2024-10-22 MED ORDER — ONDANSETRON HCL 4 MG/2ML IJ SOLN: 4.0000 mg | Freq: Four times a day (QID) | INTRAMUSCULAR | Status: DC | PRN

## 2024-10-22 MED ORDER — LEVOTHYROXINE SODIUM 25 MCG PO TABS
25.0000 ug | ORAL_TABLET | Freq: Every day | ORAL | Status: DC
  Administered 2024-10-23: 25 ug via ORAL
  Filled 2024-10-22: qty 1

## 2024-10-22 MED ORDER — CITALOPRAM HYDROBROMIDE 10 MG PO TABS
5.0000 mg | ORAL_TABLET | Freq: Every day | ORAL | Status: DC
  Administered 2024-10-23: 5 mg via ORAL
  Filled 2024-10-22 (×2): qty 1

## 2024-10-22 MED ORDER — DILTIAZEM HCL-DEXTROSE 125-5 MG/125ML-% IV SOLN (PREMIX)
5.0000 mg/h | INTRAVENOUS | Status: DC
  Administered 2024-10-22: 7.5 mg/h via INTRAVENOUS
  Administered 2024-10-22: 10 mg/h via INTRAVENOUS
  Administered 2024-10-22: 5 mg/h via INTRAVENOUS
  Administered 2024-10-23: 10 mg/h via INTRAVENOUS
  Filled 2024-10-22 (×2): qty 125

## 2024-10-22 MED ORDER — DILTIAZEM LOAD VIA INFUSION
20.0000 mg | Freq: Once | INTRAVENOUS | Status: AC
  Administered 2024-10-22: 20 mg via INTRAVENOUS
  Filled 2024-10-22: qty 20

## 2024-10-22 MED ORDER — ACETAMINOPHEN 325 MG PO TABS: 650.0000 mg | ORAL_TABLET | Freq: Four times a day (QID) | ORAL | Status: DC | PRN

## 2024-10-22 MED ORDER — TRAMADOL HCL 50 MG PO TABS: 50.0000 mg | ORAL_TABLET | Freq: Three times a day (TID) | ORAL | Status: DC | PRN

## 2024-10-22 NOTE — Plan of Care (Signed)

## 2024-10-22 NOTE — ED Provider Notes (Signed)
 [place] [facility] AT [facility] Provider Note   CSN: [id number] Arrival date & time: 10/22/24  1057     Patient presents with: Palpitations   Sheila Briggs is a 89 y.o. female.    Palpitations This patient is a very pleasant elderly 89 year old female coming from the doctor's office after she was found to be in atrial fibrillation with rapid rate.  The patient was asymptomatic and she was only there because of chronic normal well check visit, she had had blood work done a couple of days ago, they shared those labs with me and are normal metabolic panel and CBC were obtained.  She has no chest pain or shortness of breath or palpitations, she has gradual generalized weakness over time with her age but did not feel anything specific this week.  She has no palpitations.  The patient has a known history of hypothyroidism, hypertension, she takes her medications exactly as prescribed.  She has been sick for the last week or so with a cough     Prior to Admission medications   Medication Sig Start Date End Date Taking? Authorizing Provider  acetaminophen  (TYLENOL ) 500 MG tablet Take 1 tablet (500 mg total) by mouth every 6 (six) hours as needed. 08/13/24   Margrette Taft BRAVO, MD  amLODipine  (NORVASC ) 5 MG tablet Take 5 mg by mouth daily. 11/01/22   [provider]  aspirin  EC 81 MG tablet Take 301 mg by mouth daily.     [provider]  atorvastatin  (LIPITOR) 20 MG tablet Take 20 mg by mouth daily. 01/30/20   [provider]  chlorthalidone (HYGROTON) 25 MG tablet Take 25 mg by mouth daily.    [provider]  citalopram  (CELEXA ) 10 MG tablet Take 5 mg by mouth daily.    [provider]  hydrochlorothiazide  (HYDRODIURIL ) 12.5 MG tablet Take 25 mg by mouth daily.  03/20/11   [provider]  levothyroxine  (SYNTHROID , LEVOTHROID) 25 MCG tablet Take 25 mcg by mouth daily before breakfast.    [provider]   Multiple Vitamin (MULTIVITAMIN WITH MINERALS) TABS Take 1 tablet by mouth daily.    [provider]  olmesartan (BENICAR) 20 MG tablet  10/27/17   [provider]  pantoprazole  (PROTONIX ) 40 MG tablet TK ONE T PO D 08/07/19   [provider]  Polyethyl Glycol-Propyl Glycol (SYSTANE ULTRA OP) Apply 1 drop to eye 4 (four) times daily.    [provider]  potassium chloride  SA (K-DUR,KLOR-CON ) 20 MEQ tablet Take 1 tablet (20 mEq total) by mouth 2 (two) times daily. 08/03/16   Mesner, Selinda, MD  predniSONE  (STERAPRED UNI-PAK 48 TAB) 10 MG (48) TBPK tablet Take by mouth daily. 10 mg DS 12 day 08/13/24   Margrette Taft BRAVO, MD  tiZANidine  (ZANAFLEX ) 2 MG tablet Take 1 tablet (2 mg total) by mouth every 6 (six) hours as needed for muscle spasms. 08/13/24   Margrette Taft BRAVO, MD  traMADol  (ULTRAM ) 50 MG tablet Take 1 tablet (50 mg total) by mouth every 6 (six) hours as needed. 08/13/24   Margrette Taft BRAVO, MD    Allergies: Patient has no known allergies.    Review of Systems  Cardiovascular:  Positive for palpitations.  All other systems reviewed and are negative.   Updated Vital Signs BP (!) 140/91   Pulse (!) 122   Temp 98.9 F (37.2 C) (Oral)   Resp (!) 25   Ht 1.575 m (5' 2)  Wt 55.3 kg   SpO2 96%   BMI 22.31 kg/m   Physical Exam Vitals and nursing note reviewed.  Constitutional:      General: She is not in acute distress.    Appearance: She is well-developed.  HENT:     Head: Normocephalic and atraumatic.     Mouth/Throat:     Pharynx: No oropharyngeal exudate.  Eyes:     General: No scleral icterus.       Right eye: No discharge.        Left eye: No discharge.     Conjunctiva/sclera: Conjunctivae normal.     Pupils: Pupils are equal, round, and reactive to light.  Neck:     Thyroid : No thyromegaly.     Vascular: No JVD.  Cardiovascular:     Rate and Rhythm: Tachycardia present. Rhythm irregular.     Heart sounds: Normal heart  sounds. No murmur heard.    No friction rub. No gallop.  Pulmonary:     Effort: Pulmonary effort is normal. No respiratory distress.     Breath sounds: Normal breath sounds. No wheezing or rales.  Abdominal:     General: Bowel sounds are normal. There is no distension.     Palpations: Abdomen is soft. There is no mass.     Tenderness: There is no abdominal tenderness.  Musculoskeletal:        General: No tenderness. Normal range of motion.     Cervical back: Normal range of motion and neck supple.     Right lower leg: No edema.     Left lower leg: No edema.  Lymphadenopathy:     Cervical: No cervical adenopathy.  Skin:    General: Skin is warm and dry.     Findings: No erythema or rash.  Neurological:     Mental Status: She is alert.     Coordination: Coordination normal.  Psychiatric:        Behavior: Behavior normal.     (all labs ordered are listed, but only abnormal results are displayed) Labs Reviewed  BASIC METABOLIC PANEL WITH GFR - Abnormal; Notable for the following components:      Result Value   Chloride 97 (*)    Glucose, Bld 119 (*)    Anion gap 16 (*)    All other components within normal limits  CBC - Abnormal; Notable for the following components:   Platelets 454 (*)    All other components within normal limits  TSH  MAGNESIUM     EKG: EKG Interpretation Date/Time:  Wednesday October 22 2024 11:08:10 EST Ventricular Rate:  152 PR Interval:    QRS Duration:  77 QT Interval:  286 QTC Calculation: 455 R Axis:   -9  Text Interpretation: Atrial fibrillation with rapid V-rate Paired ventricular premature complexes RSR' in V1 or V2, right VCD or RVH Since last tracing afib now present Confirmed by Cleotilde Rogue ([id number]) on 10/22/2024 11:17:42 AM  Radiology: [facility] Chest Port 1 View Result Date: 10/22/2024 CLINICAL DATA:  Shortness of breath.  Atrial fibrillation. EXAM: PORTABLE CHEST 1 VIEW COMPARISON:  08/03/2016 FINDINGS: Mildly enlarged cardiac  silhouette with mild progression. Interval small amount of linear atelectasis or scarring at both lung bases. Mild-to-moderate peribronchial thickening. Tortuous and partially calcified thoracic aorta. Minimal thoracic spine degenerative changes with minimal scoliosis. Cholecystectomy clips. IMPRESSION: 1. Mild cardiomegaly with mild progression. 2. Mild-to-moderate bronchitic changes. 3. Mild bibasilar linear atelectasis or scarring. Electronically Signed   By: Elspeth Robynn HERO.D.  On: 10/22/2024 12:11     .Critical Care  Performed by: Cleotilde Rogue, MD Authorized by: Cleotilde Rogue, MD   Critical care provider statement:    Critical care time (minutes):  45   Critical care time was exclusive of:  Separately billable procedures and treating other patients and teaching time   Critical care was necessary to treat or prevent imminent or life-threatening deterioration of the following conditions:  Cardiac failure   Critical care was time spent personally by me on the following activities:  Development of treatment plan with patient or surrogate, discussions with consultants, evaluation of patient's response to treatment, examination of patient, obtaining history from patient or surrogate, review of old charts, re-evaluation of patient's condition, pulse oximetry, ordering and review of radiographic studies, ordering and review of laboratory studies and ordering and performing treatments and interventions   I assumed direction of critical care for this patient from another provider in my specialty: no     Care discussed with: admitting provider   Comments:          Medications Ordered in the ED  diltiazem  (CARDIZEM ) 1 mg/mL load via infusion 20 mg (20 mg Intravenous Bolus from Bag 10/22/24 1205)    And  diltiazem  (CARDIZEM ) 125 mg in dextrose  5% 125 mL (1 mg/mL) infusion (5 mg/hr Intravenous New Bag/Given 10/22/24 1205)                                    Medical Decision Making Amount and/or  Complexity of Data Reviewed Labs: ordered. Radiology: ordered.  Risk Prescription drug management.    This patient presents to the ED for concern of atrial fibrillation with RVR, this involves an extensive number of treatment options, and is a complaint that carries with it a high risk of complications and morbidity.  The differential diagnosis includes could be a thyroid  related issue, she has already had the rest of her labs done 2 days ago which were unremarkable, check x-ray to make sure no pneumonia   Co morbidities / Chronic conditions that complicate the patient evaluation  No history of A-fib, elderly   Additional history obtained:  Additional history obtained from EMR External records from outside source obtained and reviewed including medical record No recent admissions to the hospital, followed as an outpatient  Lab Tests:  I Ordered, and personally interpreted labs.  The pertinent results include: DBC metabolic panel unremarkable, magnesium  is normal, TSH is normal   Imaging Studies ordered:  I ordered imaging studies including chest x-ray I independently visualized and interpreted imaging which showed cardiomegaly I agree with the radiologist interpretation   Cardiac Monitoring: / EKG:  The patient was maintained on a cardiac monitor.  I personally viewed and interpreted the cardiac monitored which showed an underlying rhythm of: Atrial fibrillation with rapid ventricular rate   Problem List / ED Course / Critical interventions / Medication management  Patient presents with rapid ventricular rate and A-fib, on medications, improving only minimally, no conversion, unknown amount of time, not appropriate patient for cardioversion I ordered medication including potassium drip Reevaluation of the patient after these medicines showed that the patient some improvement in rate but still in A-fib I have reviewed the patients home medicines and have made adjustments  as needed   Consultations Obtained:  I requested consultation with the hospitalist,  and discussed lab and imaging findings as well as pertinent plan - they recommend:  Admission   Social Determinants of Health:  Elderly   Test / Admission - Considered:  Admit to high level of care      Final diagnoses:  None    ED Discharge Orders          Ordered    Amb referral to AFIB Clinic        10/22/24 1136               Cleotilde Rogue, MD 10/22/24 1223

## 2024-10-22 NOTE — Assessment & Plan Note (Signed)
 Continue PPI.

## 2024-10-22 NOTE — Discharge Instructions (Signed)

## 2024-10-22 NOTE — Assessment & Plan Note (Signed)
-   TSH within normal limits - Continue Synthroid  supplementation at current dose.

## 2024-10-22 NOTE — ED Triage Notes (Signed)
 Pt was sent to ED by her PCP, Dr. Sheryle, for c/o afib; pt states she went to see Dr. Sheryle for a checkup when he found pt to be in afib, pt states she does not have a hx of afib and has no complaints of pain or feeling like her heart is racing  Pt is a resident of [facility]

## 2024-10-22 NOTE — Telephone Encounter (Signed)
 Pharmacy Patient Advocate Encounter  Insurance verification completed.    The patient is insured through [facility]. Patient has Medicare and is not eligible for a copay card, but may be able to apply for patient assistance or Medicare RX Payment Plan (Patient Must reach out to their plan, if eligible for payment plan), if available.    Ran test claim for Eliquis  5mg  tablet and the current 30 day co-pay is $45.00.   This test claim was processed through [facility]- copay amounts may vary at other pharmacies due to pharmacy/plan contracts, or as the patient moves through the different stages of their insurance plan.

## 2024-10-22 NOTE — Assessment & Plan Note (Signed)
-   Stable for the most part - Initially will hold some of her antihypertensive agents while providing Cardizem  drip to minimize the risk/chances for hypotension.   - Follow vital signs.   - Heart healthy diet discussed with patient.

## 2024-10-22 NOTE — Assessment & Plan Note (Signed)
 Continue statin

## 2024-10-22 NOTE — Assessment & Plan Note (Signed)
-   Mood is stable - Continue treatment with Celexa .

## 2024-10-22 NOTE — H&P (Signed)
 History and Physical    Patient: Sheila Briggs FMW:[id number] DOB: 1932/06/23 DOA: 10/22/2024 DOS: the patient was seen and examined on 10/22/2024 PCP: Sheryle Carwin, MD   Patient coming from: Home  Chief Complaint:  Chief Complaint  Patient presents with   Palpitations   HPI: [name] is a 89 y.o. female with medical history significant of hypertension, hyperlipidemia, gastroesophageal reflux disease, depression/anxiety and hypothyroidism; who presented to the emergency department from her PCP office after incidentally found to be on A-fib with RVR.  Patient reports no chest pain, no shortness of breath, no palpitations and any other associated complaints.  Workup in the ED demonstrating a stable blood work, normal TSH and positive atrial fibrillation with RVR that has so far responded adequately to the use of Cardizem .  TR[facility] has been consulted to place patient in the hospital for further evaluation and management.  Cardiology service has been consulted.  Review of Systems: As mentioned in the history of present illness. All other systems reviewed and are negative. Past Medical History:  Diagnosis Date   Anxiety    Arthritis    Cancer (HCC)    basal cell of scalp   GERD (gastroesophageal reflux disease)    HOH (hard of hearing)    Hypercholesteremia    Hypertension    Hypothyroidism    Stroke (HCC) 2015   mild   Thyroid  disease    Past Surgical History:  Procedure Laterality Date   ABDOMINAL HYSTERECTOMY     APPENDECTOMY     BIOPSY THYROID      benign   CATARACT EXTRACTION W/PHACO  12/19/2012   Procedure: CATARACT EXTRACTION PHACO AND INTRAOCULAR LENS PLACEMENT (IOC);  Surgeon: Cherene Mania, MD;  Location: [facility];  Service: Ophthalmology;  Laterality: Left;  CDE 13.75   CATARACT EXTRACTION W/PHACO Right 01/06/2013   Procedure: CATARACT EXTRACTION PHACO AND INTRAOCULAR LENS PLACEMENT (IOC);  Surgeon: Cherene Mania, MD;  Location: [facility];  Service: Ophthalmology;  Laterality:  Right;  CDE: 9.96   CHOLECYSTECTOMY     ENDARTERECTOMY Right 01/13/2016   Procedure: ENDARTERECTOMY CAROTID RIGHT;  Surgeon: Lonni GORMAN Blade, MD;  Location: [facility] OR;  Service: Vascular;  Laterality: Right;   PATCH ANGIOPLASTY  01/13/2016   Procedure: RIGHT CAROTID ARTERY PATCH ANGIOPLASTY USING 1CM X 6CM XENOSURE BIOLOGIC PATCH;  Surgeon: Lonni GORMAN Blade, MD;  Location: [facility] OR;  Service: Vascular;;   TONSILLECTOMY     Social History:  reports that she has never smoked. She has never used smokeless tobacco. She reports that she does not drink alcohol  and does not use drugs.  No Known Allergies  History reviewed. No pertinent family history.  Prior to Admission medications   Medication Sig Start Date End Date Taking? Authorizing Provider  acetaminophen  (TYLENOL ) 500 MG tablet Take 1 tablet (500 mg total) by mouth every 6 (six) hours as needed. 08/13/24   Margrette Taft BRAVO, MD  amLODipine  (NORVASC ) 5 MG tablet Take 5 mg by mouth daily. 11/01/22   [provider]  aspirin  EC 81 MG tablet Take 301 mg by mouth daily.     [provider]  atorvastatin  (LIPITOR) 20 MG tablet Take 20 mg by mouth daily. 01/30/20   [provider]  chlorthalidone (HYGROTON) 25 MG tablet Take 25 mg by mouth daily.    [provider]  citalopram  (CELEXA ) 10 MG tablet Take 5 mg by mouth daily.    [provider]  hydrochlorothiazide  (HYDRODIURIL ) 12.5 MG tablet Take 25 mg by mouth  daily.  03/20/11   [provider]  levothyroxine  (SYNTHROID , LEVOTHROID) 25 MCG tablet Take 25 mcg by mouth daily before breakfast.    [provider]  Multiple Vitamin (MULTIVITAMIN WITH MINERALS) TABS Take 1 tablet by mouth daily.    [provider]  olmesartan (BENICAR) 20 MG tablet  10/27/17   [provider]  pantoprazole  (PROTONIX ) 40 MG tablet TK ONE T PO D 08/07/19   [provider]  Polyethyl Glycol-Propyl Glycol (SYSTANE ULTRA OP) Apply 1  drop to eye 4 (four) times daily.    [provider]  potassium chloride  SA (K-DUR,KLOR-CON ) 20 MEQ tablet Take 1 tablet (20 mEq total) by mouth 2 (two) times daily. 08/03/16   Mesner, Selinda, MD  tiZANidine  (ZANAFLEX ) 2 MG tablet Take 1 tablet (2 mg total) by mouth every 6 (six) hours as needed for muscle spasms. 08/13/24   Margrette Taft BRAVO, MD  traMADol  (ULTRAM ) 50 MG tablet Take 1 tablet (50 mg total) by mouth every 6 (six) hours as needed. 08/13/24   Margrette Taft BRAVO, MD    Physical Exam: Vitals:   10/22/24 1145 10/22/24 1200 10/22/24 1215 10/22/24 1230  BP: (!) 140/91 (!) 148/84 (!) 125/58 131/70  Pulse: (!) 122 (!) 119 88 93  Resp: (!) 25 20 20 20   Temp:      TempSrc:      SpO2: 96% 95% 93% 94%  Weight:      Height:       General exam: Alert, awake, oriented x 3; denying chest pain and palpitations.  In no acute distress. Respiratory system: Good saturation on room air; positive rhonchi.  No using accessory muscles. Cardiovascular system: Irregular rhythm, no rubs, no gallops, no JVD. Gastrointestinal system: Abdomen is nondistended, soft and nontender. No organomegaly or masses felt. Normal bowel sounds heard. Central nervous system: Moving 4 limbs spontaneously.  No focal neurological deficits. Extremities: No cyanosis or clubbing. Skin: No petechiae. Psychiatry: Judgement and insight appear normal. Mood & affect appropriate.   Data Reviewed: Chest x-ray demonstrating mild cardiomegaly without acute cardiopulmonary process. CBC: WBCs 10.3, hemoglobin 14.3 and platelet count 454K Magnesium : 1.9 Basic metabolic panel: Sodium 135, potassium 4.1, chloride 97, bicarb 23, BUN 21, creatinine 0.73, calcium  9.7, GFR >60 TSH: 1.600   Assessment and Plan: Assessment & Plan Atrial fibrillation with RVR (HCC) - CHA2DS2-VASc score of at least 3 - Will continue Cardizem  drip for now - Check 2D echo - Eliquis  2.5 mg twice a day has been initiated - Follow cardiology  service recommendation for further management. HTN (hypertension) - Stable for the most part - Initially will hold some of her antihypertensive agents while providing Cardizem  drip to minimize the risk/chances for hypotension.   - Follow vital signs.   - Heart healthy diet discussed with patient. Hypothyroidism (acquired) - TSH within normal limits - Continue Synthroid  supplementation at current dose. HLD (hyperlipidemia) - Continue statin. GERD (gastroesophageal reflux disease) - Continue PPI. Anxiety with depression - Mood is stable - Continue treatment with Celexa .  Advance Care Planning:   Code Status: Full Code   Consults: Cardiology service  Family Communication: No family at bedside.  Severity of Illness: The appropriate patient status for this patient is OBSERVATION. Observation status is judged to be reasonable and necessary in order to provide the required intensity of service to ensure the patient's safety. The patient's presenting symptoms, physical exam findings, and initial radiographic and laboratory data in the context of their medical condition is felt to  place them at decreased risk for further clinical deterioration. Furthermore, it is anticipated that the patient will be medically stable for discharge from the hospital within 2 midnights of admission.   Author: Eric Nunnery, MD 10/22/2024 1:00 PM  For on call review [email or web address].

## 2024-10-22 NOTE — Assessment & Plan Note (Signed)
-   CHA2DS2-VASc score of at least 3 - Will continue Cardizem  drip for now - Check 2D echo - Eliquis  2.5 mg twice a day has been initiated - Follow cardiology service recommendation for further management.

## 2024-10-23 ENCOUNTER — Observation Stay (HOSPITAL_BASED_OUTPATIENT_CLINIC_OR_DEPARTMENT_OTHER)

## 2024-10-23 DIAGNOSIS — I10 Essential (primary) hypertension: Secondary | ICD-10-CM | POA: Diagnosis not present

## 2024-10-23 DIAGNOSIS — I4891 Unspecified atrial fibrillation: Secondary | ICD-10-CM

## 2024-10-23 DIAGNOSIS — E039 Hypothyroidism, unspecified: Secondary | ICD-10-CM | POA: Diagnosis not present

## 2024-10-23 DIAGNOSIS — F418 Other specified anxiety disorders: Secondary | ICD-10-CM | POA: Diagnosis not present

## 2024-10-23 DIAGNOSIS — K219 Gastro-esophageal reflux disease without esophagitis: Secondary | ICD-10-CM | POA: Diagnosis not present

## 2024-10-23 DIAGNOSIS — E782 Mixed hyperlipidemia: Secondary | ICD-10-CM | POA: Diagnosis not present

## 2024-10-23 LAB — BASIC METABOLIC PANEL WITH GFR
Anion gap: 9 (ref 5–15)
Anion gap: 16 — ABNORMAL HIGH (ref 5–15)
BUN: 18 mg/dL (ref 8–23)
BUN: 16 mg/dL (ref 8–23)
CO2: 30 mmol/L (ref 22–32)
CO2: 22 mmol/L (ref 22–32)
Calcium: 9.1 mg/dL (ref 8.9–10.3)
Calcium: 9.4 mg/dL (ref 8.9–10.3)
Chloride: 97 mmol/L — ABNORMAL LOW (ref 98–111)
Chloride: 96 mmol/L — ABNORMAL LOW (ref 98–111)
Creatinine, Ser: 0.69 mg/dL (ref 0.44–1.00)
Creatinine, Ser: 0.64 mg/dL (ref 0.44–1.00)
GFR, Estimated: >60 mL/min (ref >60)
GFR, Estimated: >60 mL/min (ref >60)
Glucose, Bld: 96 mg/dL (ref 70–99)
Glucose, Bld: 117 mg/dL — ABNORMAL HIGH (ref 70–99)
Potassium: 2.8 mmol/L — ABNORMAL LOW (ref 3.5–5.1)
Potassium: 3.4 mmol/L — ABNORMAL LOW (ref 3.5–5.1)
Sodium: 137 mmol/L (ref 135–145)
Sodium: 133 mmol/L — ABNORMAL LOW (ref 135–145)

## 2024-10-23 LAB — CBC
HCT: 37.3 % (ref 36.0–46.0)
Hemoglobin: 12 g/dL (ref 12.0–15.0)
MCH: 29.9 pg (ref 26.0–34.0)
MCHC: 32.2 g/dL (ref 30.0–36.0)
MCV: 92.8 fL (ref 80.0–100.0)
Platelets: 402 K/uL — ABNORMAL HIGH (ref 150–400)
RBC: 4.02 MIL/uL (ref 3.87–5.11)
RDW: 14.7 % (ref 11.5–15.5)
WBC: 7.5 K/uL (ref 4.0–10.5)
nRBC: 0 % (ref 0.0–0.2)

## 2024-10-23 LAB — ECHOCARDIOGRAM COMPLETE
AR max vel: 1.9 cm2
AV Area VTI: 1.65 cm2
AV Area mean vel: 1.82 cm2
AV Mean grad: 2 mmHg
AV Peak grad: 3.9 mmHg
Ao pk vel: 0.99 m/s
Area-P 1/2: 4.37 cm2
Height: 62 in
S' Lateral: 2.4 cm
Weight: 1952 [oz_av]

## 2024-10-23 LAB — MAGNESIUM: Magnesium: 1.9 mg/dL (ref 1.7–2.4)

## 2024-10-23 MED ORDER — DILTIAZEM HCL 60 MG PO TABS: 60.0000 mg | ORAL_TABLET | Freq: Two times a day (BID) | ORAL | 2 refills | Status: DC

## 2024-10-23 MED ORDER — POTASSIUM CHLORIDE CRYS ER 20 MEQ PO TBCR
40.0000 meq | EXTENDED_RELEASE_TABLET | Freq: Once | ORAL | Status: AC
  Administered 2024-10-23: 40 meq via ORAL
  Filled 2024-10-23: qty 2

## 2024-10-23 MED ORDER — DILTIAZEM HCL 30 MG PO TABS
30.0000 mg | ORAL_TABLET | Freq: Two times a day (BID) | ORAL | Status: DC
  Administered 2024-10-23: 30 mg via ORAL
  Filled 2024-10-23: qty 1

## 2024-10-23 MED ORDER — APIXABAN 2.5 MG PO TABS: 2.5000 mg | ORAL_TABLET | Freq: Two times a day (BID) | ORAL | 2 refills | Status: AC

## 2024-10-23 MED ORDER — FUROSEMIDE 20 MG PO TABS: 20.0000 mg | ORAL_TABLET | Freq: Every day | ORAL | 3 refills | Status: AC

## 2024-10-23 NOTE — Assessment & Plan Note (Signed)
-   CHA2DS2-VASc score of 7 making patient a candidate for secondary prevention using Eliquis . - Eliquis  2.5 mg twice a day has been prescribed at discharge - Patient has been discontinue as she is now on anticoagulation therapy. - Following cardiology recommendations and after evaluating 2D echo patient has been discharged home on Cardizem  60 mg twice a day for rate control. - Patient has been referred to ambulatory atrial fibrillation clinic and outpatient follow-up with cardiology service to further adjust medications and if needed down the road perform cardioversion. - Patient's 2D echo without wall motion abnormalities and preserved ejection fraction; given high risk for diastolic heart failure will recommend daily weights/ low-sodium diet and adequate hydration. - Patient has been started on Lasix on daily basis and we will continue management for blood pressure control.

## 2024-10-23 NOTE — [place] FL2 (Signed)
 [place]  MEDICAID FL2 LEVEL OF CARE FORM     IDENTIFICATION  Patient Name: Sheila Briggs Birthdate: 1932-04-29 Sex: female Admission Date (Current Location): 10/22/2024  [facility] and [place] Number:  [employer] and Address:  [facility],  [address], [place] [address]      Provider Number: [id number]  Attending Physician Name and Address:  Ricky Fines, MD  Relative Name and Phone Number:       Current Level of Care: Hospital Recommended Level of Care: Assisted Living Facility Prior Approval Number:    Date Approved/Denied:   PASRR Number:    Discharge Plan: Other (Comment) (ALF)    Current Diagnoses: Patient Active Problem List   Diagnosis Date Noted   Atrial fibrillation with RVR (HCC) 10/22/2024   HLD (hyperlipidemia) 10/22/2024   GERD (gastroesophageal reflux disease) 10/22/2024   Anxiety with depression 10/22/2024   Senile osteoporosis 10/12/2022   Carotid stenosis 01/13/2016   Cerebral infarction (HCC) 10/13/2014   Arthritis of knee, degenerative 01/29/2014   Hypothyroidism (acquired) 06/10/2012   HTN (hypertension)    CHONDROMALACIA PATELLA 03/15/2009   KNEE PAIN 03/15/2009   IMPINGEMENT SYNDROME 03/11/2008   SHOULDER PAIN 10/16/2007   History of cardiovascular disorder 10/16/2007    Orientation RESPIRATION BLADDER Height & Weight        Normal Continent, Incontinent Weight: 122 lb (55.3 kg) Height:  5' 2 (157.5 cm)  BEHAVIORAL SYMPTOMS/MOOD NEUROLOGICAL BOWEL NUTRITION STATUS      Continent, Incontinent Diet (Regular)  AMBULATORY STATUS COMMUNICATION OF NEEDS Skin   Limited Assist Verbally Normal                       Personal Care Assistance Level of Assistance  Bathing, Feeding, Dressing Bathing Assistance: Limited assistance Feeding assistance: Independent Dressing Assistance: Limited assistance     Functional Limitations Info  Sight, Hearing, Speech Sight Info: Adequate Hearing Info:  Adequate Speech Info: Adequate    SPECIAL CARE FACTORS FREQUENCY                       Contractures Contractures Info: Not present    Additional Factors Info  Code Status, Allergies Code Status Info: FULL Allergies Info: NKA           Current Medications (10/23/2024):  This is the current hospital active medication list Current Facility-Administered Medications  Medication Dose Route Frequency Provider Last Rate Last Admin   acetaminophen  (TYLENOL ) tablet 650 mg  650 mg Oral Q6H PRN Ricky Fines, MD       Or   acetaminophen  (TYLENOL ) suppository 650 mg  650 mg Rectal Q6H PRN Ricky Fines, MD       apixaban  (ELIQUIS ) tablet 2.5 mg  2.5 mg Oral BID Ricky Fines, MD   2.5 mg at 10/23/24 9096   atorvastatin  (LIPITOR) tablet 20 mg  20 mg Oral Daily Ricky Fines, MD   20 mg at 10/23/24 9096   Chlorhexidine  Gluconate Cloth 2 % PADS 6 each  6 each Topical Q0600 Ricky Fines, MD   6 each at 10/23/24 0602   citalopram  (CELEXA ) tablet 5 mg  5 mg Oral Daily Ricky Fines, MD   5 mg at 10/23/24 9096   diltiazem  (CARDIZEM ) 125 mg in dextrose  5% 125 mL (1 mg/mL) infusion  5-15 mg/hr Intravenous Continuous Cleotilde Rogue, MD   Stopped at 10/23/24 1300   diltiazem  (CARDIZEM ) tablet 30 mg  30 mg Oral BID Alvan Dorn FALCON, MD  30 mg at 10/23/24 1220   guaiFENesin -dextromethorphan  (ROBITUSSIN DM) 100-10 MG/5ML syrup 5 mL  5 mL Oral Q4H PRN Ricky Fines, MD   5 mL at 10/23/24 9094   levothyroxine  (SYNTHROID ) tablet 25 mcg  25 mcg Oral QAC breakfast Ricky Fines, MD   25 mcg at 10/23/24 0602   ondansetron  (ZOFRAN ) tablet 4 mg  4 mg Oral Q6H PRN Ricky Fines, MD       Or   ondansetron  (ZOFRAN ) injection 4 mg  4 mg Intravenous Q6H PRN Ricky Fines, MD       pantoprazole  (PROTONIX ) EC tablet 40 mg  40 mg Oral Daily Ricky Fines, MD   40 mg at 10/23/24 9096   traMADol  (ULTRAM ) tablet 50 mg  50 mg Oral Q8H PRN Ricky Fines, MD         Discharge Medications: Allergies as  of 10/23/2024   No Known Allergies      Medication List     STOP taking these medications    amLODipine  2.5 MG tablet Commonly known as: NORVASC    aspirin  EC 81 MG tablet   chlorthalidone 25 MG tablet Commonly known as: HYGROTON   doxycycline 100 MG tablet Commonly known as: ADOXA   doxycycline 100 MG tablet Commonly known as: VIBRA-TABS       TAKE these medications    acetaminophen  500 MG tablet Commonly known as: TYLENOL  Take 1 tablet (500 mg total) by mouth every 6 (six) hours as needed.   apixaban  2.5 MG Tabs tablet Commonly known as: ELIQUIS  Take 1 tablet (2.5 mg total) by mouth 2 (two) times daily.   atorvastatin  20 MG tablet Commonly known as: LIPITOR Take 20 mg by mouth daily.   benzonatate 200 MG capsule Commonly known as: TESSALON Take 200 mg by mouth 3 (three) times daily as needed for cough.   citalopram  10 MG tablet Commonly known as: CELEXA  Take 5 mg by mouth daily.   diltiazem  60 MG tablet Commonly known as: CARDIZEM  Take 1 tablet (60 mg total) by mouth 2 (two) times daily.   furosemide 20 MG tablet Commonly known as: Lasix Take 1 tablet (20 mg total) by mouth daily.   GoodSense Stomach Relief 525 MG/30ML suspension Generic drug: bismuth subsalicylate Take 30 mLs by mouth.   levothyroxine  25 MCG tablet Commonly known as: SYNTHROID  Take 25 mcg by mouth daily before breakfast.   Myrbetriq 25 MG Tb24 tablet Generic drug: mirabegron ER Take 25 mg by mouth daily.   olmesartan 20 MG tablet Commonly known as: BENICAR   pantoprazole  40 MG tablet Commonly known as: PROTONIX  TK ONE T PO D   potassium chloride  SA 20 MEQ tablet Commonly known as: KLOR-CON  M Take 1 tablet (20 mEq total) by mouth 2 (two) times daily.         Relevant Imaging Results:  Relevant Lab Results:   Additional Information SSN: [id number] LCSWA

## 2024-10-23 NOTE — Consult Note (Signed)
 Cardiology Consultation   Patient ID: JENNINGS STIRLING MRN: [id number]; DOB: 1931-12-01  Admit date: 10/22/2024 Date of Consult: 10/23/2024  PCP:  Sheryle Carwin, MD   [facility] Providers Cardiologist:  New     Patient Profile: Sheila Briggs is a 89 y.o. female with a hx of right CEA for carotid stenosis, HLD, HTN, prior CVA who is being seen 10/23/2024 for the evaluation of new onset afib at the request of Dr Ricky.  History of Present Illness: Sheila Briggs 89 yo female history of right CEA for carotid stenosis, HLD, HTN, prior CVA presented from pcp office after being noted to be in afib with RVR. She was asymptomatic. HRs 150s in ER, started on diltiazem  gtt.     TSH 1.6 K 4.1 BUN 21 Cr 0.73 Mg 1.9 WBC 10.3 Plt 454 Hgb 14.3  EKG: afib 150 CXR: mild bronchitic changes  Past Medical History:  Diagnosis Date   Anxiety    Arthritis    Cancer (HCC)    basal cell of scalp   GERD (gastroesophageal reflux disease)    HOH (hard of hearing)    Hypercholesteremia    Hypertension    Hypothyroidism    Stroke (HCC) 2015   mild   Thyroid  disease     Past Surgical History:  Procedure Laterality Date   ABDOMINAL HYSTERECTOMY     APPENDECTOMY     BIOPSY THYROID      benign   CATARACT EXTRACTION W/PHACO  12/19/2012   Procedure: CATARACT EXTRACTION PHACO AND INTRAOCULAR LENS PLACEMENT (IOC);  Surgeon: Cherene Mania, MD;  Location: [facility];  Service: Ophthalmology;  Laterality: Left;  CDE 13.75   CATARACT EXTRACTION W/PHACO Right 01/06/2013   Procedure: CATARACT EXTRACTION PHACO AND INTRAOCULAR LENS PLACEMENT (IOC);  Surgeon: Cherene Mania, MD;  Location: [facility];  Service: Ophthalmology;  Laterality: Right;  CDE: 9.96   CHOLECYSTECTOMY     ENDARTERECTOMY Right 01/13/2016   Procedure: ENDARTERECTOMY CAROTID RIGHT;  Surgeon: Lonni GORMAN Blade, MD;  Location: [facility];  Service: Vascular;  Laterality: Right;   PATCH ANGIOPLASTY  01/13/2016   Procedure: RIGHT CAROTID ARTERY PATCH  ANGIOPLASTY USING 1CM X 6CM XENOSURE BIOLOGIC PATCH;  Surgeon: Lonni GORMAN Blade, MD;  Location: [facility] OR;  Service: Vascular;;   TONSILLECTOMY        Scheduled Meds:  apixaban   2.5 mg Oral BID   atorvastatin   20 mg Oral Daily   Chlorhexidine  Gluconate Cloth  6 each Topical Q0600   citalopram   5 mg Oral Daily   diltiazem   30 mg Oral BID   levothyroxine   25 mcg Oral QAC breakfast   pantoprazole   40 mg Oral Daily   Continuous Infusions:  diltiazem  (CARDIZEM ) infusion 5 mg/hr (10/23/24 0659)   PRN Meds: acetaminophen  **OR** acetaminophen , guaiFENesin -dextromethorphan , ondansetron  **OR** ondansetron  (ZOFRAN ) IV, traMADol   Allergies:   Allergies[1]  Social History:   Social History   Socioeconomic History   Marital status: Widowed    Spouse name: Not on file   Number of children: Not on file   Years of education: Not on file   Highest education level: Not on file  Occupational History   Not on file  Tobacco Use   Smoking status: Never   Smokeless tobacco: Never  Vaping Use   Vaping status: Never Used  Substance and Sexual Activity   Alcohol  use: No   Drug use: No   Sexual activity: Yes    Birth control/protection: Surgical  Other Topics Concern   Not  on file  Social History Narrative   Not on file   Social Drivers of Health   Tobacco Use: Low Risk (10/22/2024)   Patient History    Smoking Tobacco Use: Never    Smokeless Tobacco Use: Never    Passive Exposure: Not on file  Financial Resource Strain: Not on file  Food Insecurity: No Food Insecurity (10/22/2024)   Epic    Worried About Programme Researcher, Broadcasting/film/video in the Last Year: Never true    Ran Out of Food in the Last Year: Never true  Transportation Needs: No Transportation Needs (10/22/2024)   Epic    Lack of Transportation (Medical): No    Lack of Transportation (Non-Medical): No  Physical Activity: Not on file  Stress: Not on file  Social Connections: Socially Isolated (10/22/2024)   Social Connection and  Isolation Panel    Frequency of Communication with Friends and Family: More than three times a week    Frequency of Social Gatherings with Friends and Family: More than three times a week    Attends Religious Services: Never    Database Administrator or Organizations: No    Attends Banker Meetings: Never    Marital Status: Widowed  Intimate Partner Violence: Not At Risk (10/22/2024)   Epic    Fear of Current or Ex-Partner: No    Emotionally Abused: No    Physically Abused: No    Sexually Abused: No  Depression (PHQ2-9): Low Risk (10/17/2023)   Depression (PHQ2-9)    PHQ-2 Score: 0  Alcohol  Screen: Not on file  Housing: Low Risk (10/22/2024)   Epic    Unable to Pay for Housing in the Last Year: No    Number of Times Moved in the Last Year: 0    Homeless in the Last Year: No  Utilities: Not At Risk (10/22/2024)   Epic    Threatened with loss of utilities: No  Health Literacy: Not on file    Family History:   History reviewed. No pertinent family history.   ROS:  Please see the history of present illness.   All other ROS reviewed and negative.     Physical Exam/Data: Vitals:   10/23/24 0700 10/23/24 0755 10/23/24 0800 10/23/24 0900  BP: (!) 125/50  (!) 144/61 (!) 126/46  Pulse: (!) 104 99 91 79  Resp: 17 15 19 19   Temp:  98 F (36.7 C)    TempSrc:  Oral    SpO2: 91% 96% 96% 94%  Weight:      Height:        Intake/Output Summary (Last 24 hours) at 10/23/2024 1131 Last data filed at 10/23/2024 0800 Gross per 24 hour  Intake 1154.66 ml  Output 400 ml  Net 754.66 ml      10/22/2024   11:07 AM 08/03/2024   11:12 AM 04/19/2023    1:41 PM  Last 3 Weights  Weight (lbs) 122 lb 123 lb 123 lb  Weight (kg) 55.339 kg 55.792 kg 55.792 kg     Body mass index is 22.31 kg/m.  General:  Well nourished, well developed, in no acute distress HEENT: normal Neck: no JVD Vascular: No carotid bruits; Distal pulses 2+ bilaterally Cardiac:  irreg Lungs:  clear to  auscultation bilaterally, no wheezing, rhonchi or rales  Abd: soft, nontender, no hepatomegaly  Ext: no edema Musculoskeletal:  No deformities, BUE and BLE strength normal and equal Skin: warm and dry  Neuro:  CNs 2-12 intact, no focal abnormalities  noted Psych:  Normal affect    Laboratory Data: High Sensitivity Troponin:  No results for input(s): TROPONINIHS in the last 720 hours.   Chemistry Recent Labs  Lab 10/22/24 1136 10/23/24 0423  NA 135 137  K 4.1 2.8*  CL 97* 97*  CO2 23 30  GLUCOSE 119* 96  BUN 21 18  CREATININE 0.73 0.69  CALCIUM  9.7 9.1  MG 1.9 1.9  GFRNONAA >60 >60  ANIONGAP 16* 9    No results for input(s): PROT, ALBUMIN, AST, ALT, ALKPHOS, BILITOT in the last 168 hours. Lipids No results for input(s): CHOL, TRIG, HDL, LABVLDL, LDLCALC, CHOLHDL in the last 168 hours.  Hematology Recent Labs  Lab 10/22/24 1136 10/23/24 0423  WBC 10.3 7.5  RBC 4.72 4.02  HGB 14.3 12.0  HCT 44.2 37.3  MCV 93.6 92.8  MCH 30.3 29.9  MCHC 32.4 32.2  RDW 14.8 14.7  PLT 454* 402*   Thyroid   Recent Labs  Lab 10/22/24 1135  TSH 1.600    BNPNo results for input(s): BNP, PROBNP in the last 168 hours.  DDimer No results for input(s): DDIMER in the last 168 hours.  Radiology/Studies:  DG Chest Port 1 View Result Date: 10/22/2024 CLINICAL DATA:  Shortness of breath.  Atrial fibrillation. EXAM: PORTABLE CHEST 1 VIEW COMPARISON:  08/03/2016 FINDINGS: Mildly enlarged cardiac silhouette with mild progression. Interval small amount of linear atelectasis or scarring at both lung bases. Mild-to-moderate peribronchial thickening. Tortuous and partially calcified thoracic aorta. Minimal thoracic spine degenerative changes with minimal scoliosis. Cholecystectomy clips. IMPRESSION: 1. Mild cardiomegaly with mild progression. 2. Mild-to-moderate bronchitic changes. 3. Mild bibasilar linear atelectasis or scarring. Electronically Signed   By: Elspeth Bathe  M.D.   On: 10/22/2024 12:11     Assessment and Plan: 1.Afib - new diagnosis, admitted from her pcp office after being detected. She has been symptomatic - HRs 150s in ER, started on diltiazem  drip. - rates 100 on dilt at 5mg /hr. Will start oral dilt 30mg  bid, see if can wean off drip. -CHADS2Vasc score is  7 (HTN, age x2, CVA, gender, vascular disease). Started on eliquis  2.5mg  bid, dosing based on age and weight. - echo pending  2. HTN - stop norvasc  since starting dilt, hold chlorthalidone and monitor bp with the addition of diltiazem    3.History of carotid stenosis - since on eliquis  can d/c aspirin   Follow rates into afternoon, if off dilt gtt and rates controlled on oral dilt alone likely discharge later today pending echo.   Risk Assessment/Risk Scores:       CHA2DS2-VASc Score = 7   This indicates a 11.2% annual risk of stroke. The patient's score is based upon: CHF History: 0 HTN History: 1 Diabetes History: 0 Stroke History: 2 Vascular Disease History: 1 Age Score: 2 Gender Score: 1      For questions or updates, please contact [facility] Please consult [email or web address] for contact info under      Signed, Alvan Carrier, MD  10/23/2024 11:31 AM     [1] No Known Allergies

## 2024-10-23 NOTE — Care Management Obs Status (Signed)
 MEDICARE OBSERVATION STATUS NOTIFICATION   Patient Details  Name: Sheila Briggs MRN: [id number] Date of Birth: 11-Oct-1932   Medicare Observation Status Notification Given:  Yes    Duwaine LITTIE Ada 10/23/2024, 3:59 PM

## 2024-10-23 NOTE — Assessment & Plan Note (Signed)
-   Stable and well-controlled - Continue adjusted antihypertensive agents - Follow-up vital sign - Heart healthy/low-sodium diet discussed with patient.

## 2024-10-23 NOTE — TOC Initial Note (Signed)
 Transition of Care [facility]) - Initial/Assessment Note    Patient Details  Name: Sheila Briggs MRN: [id number] Date of Birth: September 12, 1932  [facility] CM/SW Contact:    Sheila Briggs, LCSWA Phone Number: 10/23/2024, 9:28 AM  Clinical Narrative:                 CSW notes per chart review that pt is a resident at [facility]. CSW spoke to Sheila Briggs to complete assessment. Pt has assistance with completing ADLs. Pt has a walker that she uses when ambulating. Sheila Briggs states facility will walk beside her as well if needed. Pt is on a regular diet there. TOC to follow.   Expected Discharge Plan: Assisted Living Barriers to Discharge: Continued Medical Work up   Patient Goals and CMS Choice Patient states their goals for this hospitalization and ongoing recovery are:: return to ALF CMS Medicare.gov Compare Post Acute Care list provided to:: Patient Choice offered to / list presented to : Patient      Expected Discharge Plan and Services In-house Referral: Clinical Social Work Discharge Planning Services: CM Consult   Living arrangements for the past 2 months: Assisted Living Facility                                      Prior Living Arrangements/Services Living arrangements for the past 2 months: Assisted Living Facility Lives with:: Facility Resident Patient language and need for interpreter reviewed:: Yes Do you feel safe going back to the place where you live?: Yes      Need for Family Participation in Patient Care: Yes (Comment) Care giver support system in place?: Yes (comment) Current home services: DME Criminal Activity/Legal Involvement Pertinent to Current Situation/Hospitalization: No - Comment as needed  Activities of Daily Living   ADL Screening (condition at time of admission) Independently performs ADLs?: Yes (appropriate for developmental age) Is the patient deaf or have difficulty hearing?: No Does the patient have difficulty seeing, even  when wearing glasses/contacts?: No Does the patient have difficulty concentrating, remembering, or making decisions?: No  Permission Sought/Granted                  Emotional Assessment Appearance:: Appears stated age       Alcohol  / Substance Use: Not Applicable Psych Involvement: No (comment)  Admission diagnosis:  Atrial fibrillation with RVR (HCC) [I48.91] Patient Active Problem List   Diagnosis Date Noted   Atrial fibrillation with RVR (HCC) 10/22/2024   HLD (hyperlipidemia) 10/22/2024   GERD (gastroesophageal reflux disease) 10/22/2024   Anxiety with depression 10/22/2024   Senile osteoporosis 10/12/2022   Carotid stenosis 01/13/2016   Cerebral infarction (HCC) 10/13/2014   Arthritis of knee, degenerative 01/29/2014   Hypothyroidism (acquired) 06/10/2012   HTN (hypertension)    CHONDROMALACIA PATELLA 03/15/2009   KNEE PAIN 03/15/2009   IMPINGEMENT SYNDROME 03/11/2008   SHOULDER PAIN 10/16/2007   History of cardiovascular disorder 10/16/2007   PCP:  Sheila Carwin, MD Pharmacy:   [facility] [place] - [address] [place] [address] Phone: [phone] Fax: [phone]     Social Drivers of Health (SDOH) Social History: SDOH Screenings   Food Insecurity: No Food Insecurity (10/22/2024)  Housing: Low Risk (10/22/2024)  Transportation Needs: No Transportation Needs (10/22/2024)  Utilities: Not At Risk (10/22/2024)  Depression (PHQ2-9): Low Risk (10/17/2023)  Social Connections: Socially Isolated (10/22/2024)  Tobacco Use: Low  Risk (10/22/2024)   SDOH Interventions:     Readmission Risk Interventions     No data to display

## 2024-10-23 NOTE — Discharge Summary (Signed)
 Physician Discharge Summary   Patient: Sheila Briggs MRN: [id number] DOB: Mar 20, 1932  Admit date:     10/22/2024  Discharge date: 10/23/2024  Discharge Physician: Eric Nunnery   PCP: Sheryle Carwin, MD   Recommendations at discharge:  Reassess blood pressure/volume status and adjust antihypertensive agents and diuretics as needed. Repeat basic metabolic panel to follow electrolytes and renal function Make sure patient follow-up with cardiology service as instructed Repeat CBC to follow hemoglobin trend/stability.  Discharge Diagnoses: Principal Problem:   Atrial fibrillation with RVR (HCC) Active Problems:   HTN (hypertension)   Hypothyroidism (acquired)   HLD (hyperlipidemia)   GERD (gastroesophageal reflux disease)   Anxiety with depression  Brief Hospital admission narrative: Sheila Briggs is a 89 y.o. female with medical history significant of hypertension, hyperlipidemia, gastroesophageal reflux disease, depression/anxiety and hypothyroidism; who presented to the emergency department from her PCP office after incidentally found to be on A-fib with RVR.  Patient reports no chest pain, no shortness of breath, no palpitations and any other associated complaints.   Workup in the ED demonstrating a stable blood work, normal TSH and positive atrial fibrillation with RVR that has so far responded adequately to the use of Cardizem .  TRH has been consulted to place patient in the hospital for further evaluation and management.   Cardiology service has been consulted.  Assessment and Plan: Assessment & Plan Atrial fibrillation with RVR (HCC) - CHA2DS2-VASc score of 7 making patient a candidate for secondary prevention using Eliquis . - Eliquis  2.5 mg twice a day has been prescribed at discharge - Patient has been discontinue as she is now on anticoagulation therapy. - Following cardiology recommendations and after evaluating 2D echo patient has been discharged home on Cardizem  60 mg  twice a day for rate control. - Patient has been referred to ambulatory atrial fibrillation clinic and outpatient follow-up with cardiology service to further adjust medications and if needed down the road perform cardioversion. - Patient's 2D echo without wall motion abnormalities and preserved ejection fraction; given high risk for diastolic heart failure will recommend daily Briggs/ low-sodium diet and adequate hydration. - Patient has been started on Lasix on daily basis and we will continue management for blood pressure control. HTN (hypertension) - Stable and well-controlled - Continue adjusted antihypertensive agents - Follow-up vital sign - Heart healthy/low-sodium diet discussed with patient. Hypothyroidism (acquired) - TSH was found to be within normal limits - Continue Synthroid  at current dose. HLD (hyperlipidemia) - Continue statins - Heart healthy diet discussed with patient. GERD (gastroesophageal reflux disease) - Continue PPI. Anxiety with depression - Mood is stable - Continue treatment with Celexa . - No suicidal ideation or hallucination.  Consultants: Cardiology service Procedures performed: See below for x-ray reports; 2D echo. Disposition: Home. Diet recommendation: Heart healthy/low-sodium diet.  DISCHARGE MEDICATION: Allergies as of 10/23/2024   No Known Allergies      Medication List     STOP taking these medications    amLODipine  2.5 MG tablet Commonly known as: NORVASC    aspirin  EC 81 MG tablet   chlorthalidone 25 MG tablet Commonly known as: HYGROTON   doxycycline 100 MG tablet Commonly known as: ADOXA   doxycycline 100 MG tablet Commonly known as: VIBRA-TABS       TAKE these medications    acetaminophen  500 MG tablet Commonly known as: TYLENOL  Take 1 tablet (500 mg total) by mouth every 6 (six) hours as needed.   apixaban  2.5 MG Tabs tablet Commonly known as: ELIQUIS  Take 1 tablet (  2.5 mg total) by mouth 2 (two) times  daily.   atorvastatin  20 MG tablet Commonly known as: LIPITOR Take 20 mg by mouth daily.   benzonatate 200 MG capsule Commonly known as: TESSALON Take 200 mg by mouth 3 (three) times daily as needed for cough.   citalopram  10 MG tablet Commonly known as: CELEXA  Take 5 mg by mouth daily.   diltiazem  60 MG tablet Commonly known as: CARDIZEM  Take 1 tablet (60 mg total) by mouth 2 (two) times daily.   furosemide 20 MG tablet Commonly known as: Lasix Take 1 tablet (20 mg total) by mouth daily.   GoodSense Stomach Relief 525 MG/30ML suspension Generic drug: bismuth subsalicylate Take 30 mLs by mouth.   levothyroxine  25 MCG tablet Commonly known as: SYNTHROID  Take 25 mcg by mouth daily before breakfast.   Myrbetriq 25 MG Tb24 tablet Generic drug: mirabegron ER Take 25 mg by mouth daily.   olmesartan 20 MG tablet Commonly known as: BENICAR   pantoprazole  40 MG tablet Commonly known as: PROTONIX  TK ONE T PO D   potassium chloride  SA 20 MEQ tablet Commonly known as: KLOR-CON  M Take 1 tablet (20 mEq total) by mouth 2 (two) times daily.        Follow-up Information     Sheryle Carwin, MD. Schedule an appointment as soon as possible for a visit in 10 day(s).   Specialty: Internal Medicine Contact information: [address] [place] [address] [phone]                Discharge Exam: Sheila Briggs   10/22/24 1107  Weight: 55.3 kg   General exam: Alert, awake, oriented x 3 Respiratory system: Good saturation on room air. Cardiovascular system: Rate controlled; abnormal breathing.  No rubs, no gallops, no JVD. Gastrointestinal system: Abdomen is nondistended, soft and nontender. No organomegaly or masses felt. Normal bowel sounds heard. Central nervous system: No focal neurological deficits. Extremities: No cyanosis or clubbing. Skin: No rashes, lesions or ulcers Psychiatry: Judgement and insight appear normal. Mood & affect appropriate.     Condition at discharge: Stable and improved.  The results of significant diagnostics from this hospitalization (including imaging, microbiology, ancillary and laboratory) are listed below for reference.   Imaging Studies: ECHOCARDIOGRAM COMPLETE Result Date: 10/23/2024    ECHOCARDIOGRAM REPORT   Patient Name:   Sheila Briggs Date of Exam: 10/23/2024 Medical Rec #:  [id number]      Height:       62.0 in Accession #:    [id number]     Weight:       122.0 lb Date of Birth:  1931-12-29      BSA:          1.549 m Patient Age:    89 years       BP:           179/96 mmHg Patient Gender: F              HR:           122 bpm. Exam Location:  [facility] Procedure: 2D Echo, Cardiac Doppler, Color Doppler and Strain Analysis (Both            Spectral and Color Flow Doppler were utilized during procedure). Indications:     Atrial fibrillation  History:         Patient has no prior history of Echocardiogram examinations.  Arrythmias:Atrial Fibrillation; Risk Factors:Hypertension and                  Dyslipidemia.  Sonographer:     Philomena Daring Referring Phys:  [id number] Sanayah Munro Diagnosing Phys: Dorn Ross MD IMPRESSIONS  1. Left ventricular ejection fraction, by estimation, is 60 to 65%. The left ventricle has normal function. The left ventricle has no regional wall motion abnormalities. Left ventricular diastolic parameters are indeterminate.  2. Right ventricular systolic function is normal. The right ventricular size is normal. Tricuspid regurgitation signal is inadequate for assessing PA pressure.  3. The mitral valve is abnormal. Mild mitral valve regurgitation. No evidence of mitral stenosis.  4. The aortic valve is tricuspid. Aortic valve regurgitation is mild. No aortic stenosis is present.  5. The inferior vena cava is normal in size with <50% respiratory variability, suggesting right atrial pressure of 8 mmHg. FINDINGS  Left Ventricle: Left ventricular ejection fraction, by estimation,  is 60 to 65%. The left ventricle has normal function. The left ventricle has no regional wall motion abnormalities. Strain was performed and the global longitudinal strain is indeterminate. The left ventricular internal cavity size was normal in size. There is no left ventricular hypertrophy. Left ventricular diastolic parameters are indeterminate. Right Ventricle: The right ventricular size is normal. Right vetricular wall thickness was not well visualized. Right ventricular systolic function is normal. Tricuspid regurgitation signal is inadequate for assessing PA pressure. Left Atrium: Left atrial size was normal in size. Right Atrium: Right atrial size was normal in size. Pericardium: There is no evidence of pericardial effusion. Mitral Valve: The mitral valve is abnormal. Mild mitral valve regurgitation. No evidence of mitral valve stenosis. Tricuspid Valve: The tricuspid valve is normal in structure. Tricuspid valve regurgitation is trivial. No evidence of tricuspid stenosis. Aortic Valve: The aortic valve is tricuspid. Aortic valve regurgitation is mild. No aortic stenosis is present. Aortic valve mean gradient measures 2.0 mmHg. Aortic valve peak gradient measures 3.9 mmHg. Aortic valve area, by VTI measures 1.65 cm. Pulmonic Valve: The pulmonic valve was not well visualized. Pulmonic valve regurgitation is not visualized. No evidence of pulmonic stenosis. Aorta: The aortic root and ascending aorta are structurally normal, with no evidence of dilitation. Venous: The inferior vena cava is normal in size with less than 50% respiratory variability, suggesting right atrial pressure of 8 mmHg. IAS/Shunts: No atrial level shunt detected by color flow Doppler.  LEFT VENTRICLE PLAX 2D LVIDd:         3.50 cm   Diastology LVIDs:         2.40 cm   LV e' medial:    7.18 cm/s LV PW:         1.00 cm   LV E/e' medial:  18.1 LV IVS:        1.00 cm   LV e' lateral:   9.68 cm/s LVOT diam:     1.80 cm   LV E/e' lateral: 13.4  LV SV:         31 LV SV Index:   20 LVOT Area:     2.54 cm  RIGHT VENTRICLE            IVC RV Basal diam:  3.10 cm    IVC diam: 1.50 cm RV Mid diam:    2.60 cm RV S prime:     8.70 cm/s TAPSE (M-mode): 1.5 cm LEFT ATRIUM             Index  RIGHT ATRIUM           Index LA diam:        3.30 cm 2.13 cm/m   RA Area:     15.30 cm LA Vol (A2C):   35.8 ml 23.10 ml/m  RA Volume:   33.50 ml  21.62 ml/m LA Vol (A4C):   39.6 ml 25.56 ml/m LA Biplane Vol: 39.7 ml 25.62 ml/m  AORTIC VALVE AV Area (Vmax):    1.90 cm AV Area (Vmean):   1.82 cm AV Area (VTI):     1.65 cm AV Vmax:           98.50 cm/s AV Vmean:          68.800 cm/s AV VTI:            0.188 m AV Peak Grad:      3.9 mmHg AV Mean Grad:      2.0 mmHg LVOT Vmax:         73.47 cm/s LVOT Vmean:        49.233 cm/s LVOT VTI:          0.122 m LVOT/AV VTI ratio: 0.65  AORTA Ao Root diam: 2.80 cm Ao Asc diam:  3.20 cm MITRAL VALVE MV Area (PHT): 4.37 cm     SHUNTS MV Decel Time: 174 msec     Systemic VTI:  0.12 m MV E velocity: 130.00 cm/s  Systemic Diam: 1.80 cm Dorn Ross MD Electronically signed by Dorn Ross MD Signature Date/Time: 10/23/2024/3:42:46 PM    Final (Updated)    DG Chest Port 1 View Result Date: 10/22/2024 CLINICAL DATA:  Shortness of breath.  Atrial fibrillation. EXAM: PORTABLE CHEST 1 VIEW COMPARISON:  08/03/2016 FINDINGS: Mildly enlarged cardiac silhouette with mild progression. Interval small amount of linear atelectasis or scarring at both lung bases. Mild-to-moderate peribronchial thickening. Tortuous and partially calcified thoracic aorta. Minimal thoracic spine degenerative changes with minimal scoliosis. Cholecystectomy clips. IMPRESSION: 1. Mild cardiomegaly with mild progression. 2. Mild-to-moderate bronchitic changes. 3. Mild bibasilar linear atelectasis or scarring. Electronically Signed   By: Elspeth Bathe M.D.   On: 10/22/2024 12:11    Microbiology: Results for orders placed or performed during the hospital  encounter of 10/22/24  MRSA Next Gen by PCR, Nasal     Status: None   Collection Time: 10/22/24  1:40 PM   Specimen: Nasal Mucosa; Nasal Swab  Result Value Ref Range Status   MRSA by PCR Next Gen NOT DETECTED NOT DETECTED Final    Comment: (NOTE) The GeneXpert MRSA Assay (FDA approved for NASAL specimens only), is one component of a comprehensive MRSA colonization surveillance program. It is not intended to diagnose MRSA infection nor to guide or monitor treatment for MRSA infections. Test performance is not FDA approved in patients less than 97 years old. Performed at [facility], [address] [place], [place] [address]     Labs: CBC: Recent Labs  Lab 10/22/24 1136 10/23/24 0423  WBC 10.3 7.5  HGB 14.3 12.0  HCT 44.2 37.3  MCV 93.6 92.8  PLT 454* 402*   Basic Metabolic Panel: Recent Labs  Lab 10/22/24 1136 10/23/24 0423 10/23/24 1207  NA 135 137 133*  K 4.1 2.8* 3.4*  CL 97* 97* 96*  CO2 23 30 22   GLUCOSE 119* 96 117*  BUN 21 18 16   CREATININE 0.73 0.69 0.64  CALCIUM  9.7 9.1 9.4  MG 1.9 1.9  --    Liver Function Tests: No results for input(s):  AST, ALT, ALKPHOS, BILITOT, PROT, ALBUMIN in the last 168 hours. CBG: No results for input(s): GLUCAP in the last 168 hours.  Discharge time spent:  35 minutes.  Signed: Eric Nunnery, MD [facility] 10/23/2024

## 2024-10-23 NOTE — Assessment & Plan Note (Signed)
-   Mood is stable - Continue treatment with Celexa . - No suicidal ideation or hallucination.

## 2024-10-23 NOTE — TOC Transition Note (Signed)
 Transition of Care [facility]) - Discharge Note   Patient Details  Name: Sheila Briggs MRN: [id number] Date of Birth: 03/13/1932  Transition of Care [facility]) CM/SW Contact:  Lucie Lunger, LCSWA Phone Number: 10/23/2024, 5:12 PM   Clinical Narrative:    CSW updated that pt is medically stable for D/C back to [facility]. CSW spoke to [place] with ALF who requested D/C summary and Fl2 be sent to the facility via fax. CSW sent both to be reviewed. [place] states pts son can go ahead and transport back to facility. RN and MD updated. TOC signing off.   Final next level of care: Assisted Living Barriers to Discharge: Barriers Resolved   Patient Goals and CMS Choice Patient states their goals for this hospitalization and ongoing recovery are:: return to ALF CMS Medicare.gov Compare Post Acute Care list provided to:: Patient Choice offered to / list presented to : Patient      Discharge Placement                Patient to be transferred to facility by: Son Name of family member notified: Son Patient and family notified of of transfer: 10/23/24  Discharge Plan and Services Additional resources added to the After Visit Summary for   In-house Referral: Clinical Social Work Discharge Planning Services: CM Consult                                 Social Drivers of Health (SDOH) Interventions SDOH Screenings   Food Insecurity: No Food Insecurity (10/22/2024)  Housing: Low Risk (10/22/2024)  Transportation Needs: No Transportation Needs (10/22/2024)  Utilities: Not At Risk (10/22/2024)  Depression (PHQ2-9): Low Risk (10/17/2023)  Social Connections: Socially Isolated (10/22/2024)  Tobacco Use: Low Risk (10/22/2024)     Readmission Risk Interventions     No data to display

## 2024-10-23 NOTE — Assessment & Plan Note (Signed)
-   TSH was found to be within normal limits - Continue Synthroid  at current dose.

## 2024-10-23 NOTE — Plan of Care (Signed)

## 2024-10-23 NOTE — Assessment & Plan Note (Signed)
-  Continue statins. -Heart healthy diet discussed with patient. 

## 2024-10-23 NOTE — Assessment & Plan Note (Signed)
 Continue PPI.

## 2024-10-27 DIAGNOSIS — M6281 Muscle weakness (generalized): Secondary | ICD-10-CM | POA: Diagnosis not present

## 2024-10-27 DIAGNOSIS — R278 Other lack of coordination: Secondary | ICD-10-CM | POA: Diagnosis not present

## 2024-10-28 DIAGNOSIS — R278 Other lack of coordination: Secondary | ICD-10-CM | POA: Diagnosis not present

## 2024-10-28 DIAGNOSIS — M6281 Muscle weakness (generalized): Secondary | ICD-10-CM | POA: Diagnosis not present

## 2024-11-09 NOTE — Progress Notes (Signed)
 " Cardiology Office Note:  .   Date:  11/10/2024  ID:  Sheila Briggs, DOB [birth year], MRN [id number] PCP: Sheryle Carwin, MD  [facility] Providers Cardiologist:  Alvan Carrier, MD {  History of Present Illness: .   Sheila Briggs is a 89 y.o. female  with PMHx of right CEA for carotid stenosis, HLD, HTN, prior CVA, GERD, depression/anxiety, hypothyroidism who reports to [facility] office for follow up.   Pertinent cardiac medical history:  Carotid Stenosis  S/p right CEA in 01/2016 Followed by [facility]  Afib  Newly diagnosed 10/22/2024.  ECHO 10/23/2024: EF 60 to 65%, mild MV regurgitation, mild AV regurgitation  Admitted to hospital from her PCP after afib was detected. Seen by heartcare 10/23/2024 by Dr. Alvan for first time during hospital consult for new onset afib with RVR.  She was asymptomatic.  TSH/Mg/CBC WNL.  Echo 10/23/2024 as above.  Treated with IV diltiazem , discharged on p.o. diltiazem  60 mg twice daily, and discontinued amlodipine  and chlorthalidone.  With CHADS2Vasc score is  7, started on Eliquis  2.5 mg twice daily and discontinued ASA.  Also started on Lasix  20 mg daily and KCl 20 mEq twice daily.  Continued on Lipitor 20 mg daily, olmesartan 20 mg daily.  Discharger weight of 122 lbs. Referred to A-fib clinic and outpatient follow-up with cardiology.  Today, accompanied by son.  Reports dry cough and congestion for weeks.  Discussed with PCP today over phone and prescribed new congestion medication that she has not started yet. No associated fevers. She was never able to feel being in atrial fibrillation and still is not able to tell. Denies chest pain, shortness of breath, palpitations, syncope, presyncope, dizziness, orthopnea, PND, swelling or significant weight changes, acute bleeding, or claudication.  She stays at [facility] who manages her medication. Medication list is reviewed and patient is on appropriate medications  as discharge above. She admits to eating whatever she wants, however preference is limited since meals provided by assisted living facility.  Activity is limited to facility. She is able to participate in group activities and uses a walker to get around facility.  ROS: 10 point review of system has been reviewed and considered negative except ones been listed in the HPI.   Studies Reviewed: SABRA   EKG Interpretation Date/Time:  Monday November 10 2024 13:47:47 EST Ventricular Rate:  120 PR Interval:    QRS Duration:  76 QT Interval:  330 QTC Calculation: 466 R Axis:   21  Text Interpretation: Atrial fibrillation with rapid ventricular response When compared with ECG of 22-Oct-2024 11:08, PREVIOUS ECG IS PRESENT Reconfirmed by Sheron Hallmark ([id number]) on 11/10/2024 1:51:23 PM   ECHO IMPRESSIONS 10/23/2024  1. Left ventricular ejection fraction, by estimation, is 60 to 65%. The  left ventricle has normal function. The left ventricle has no regional  wall motion abnormalities. Left ventricular diastolic parameters are  indeterminate.   2. Right ventricular systolic function is normal. The right ventricular  size is normal. Tricuspid regurgitation signal is inadequate for assessing  PA pressure.   3. The mitral valve is abnormal. Mild mitral valve regurgitation. No  evidence of mitral stenosis.   4. The aortic valve is tricuspid. Aortic valve regurgitation is mild. No  aortic stenosis is present.   5. The inferior vena cava is normal in size with <50% respiratory  variability, suggesting right atrial pressure of 8 mmHg.  CV Studies: Cardiac studies reviewed are outlined and summarized  above. Otherwise please see EMR for full report.   Risk Assessment/Calculations:    CHA2DS2-VASc Score = 7   This indicates a 11.2% annual risk of stroke. The patient's score is based upon: CHF History: 0 HTN History: 1 Diabetes History: 0 Stroke History: 2 Vascular Disease History: 1 Age Score:  2 Gender Score: 1  Physical Exam:   VS:  BP 130/68 (BP Location: Left Arm, Cuff Size: Normal)   Pulse (!) 120   Ht 5' 2 (1.575 m)   Wt 117 lb (53.1 kg)   SpO2 94%   BMI 21.40 kg/m    Wt Readings from Last 3 Encounters:  11/10/24 117 lb (53.1 kg)  10/22/24 122 lb (55.3 kg)  08/03/24 123 lb (55.8 kg)    GEN: Well nourished, well developed in no acute distress while sitting in chair.  Accompanied by son NECK: No JVD; No carotid bruits CARDIAC: Irregularly irregular, no murmurs, rubs, gallops RESPIRATORY:  Clear to auscultation without rales, wheezing or rhonchi  ABDOMEN: Soft, non-tender, non-distended EXTREMITIES:  No edema; No deformity   ASSESSMENT AND PLAN: .    PAF (paroxysmal atrial fibrillation) (HCC) EKG today: Afib with RVR ,HR 120 On exam noted irregularly irregular.  Denies palpitations, dizziness, or chest discomfort. No recurrent dark stools or bleeding. Discontinued short acting Diltiazem  60 mg twice daily.  Ordered Cardizem  CD 180 mg. Follow up in 1 week for EKG and BP check.  On AC with CBC WNL as below. Order CBC since started [facility] in 10/2024.  Continue on Eliquis  2.5 mg twice daily (dose appropriate for age 57, weight 53 kg, Cr WNL in 10/2024)  Discussed antiarrhythmic amiodarone versus DCCV versus A-fib clinic referral.  They would prefer to medication management. Will discuss with Dr. Alvan if he agrees with starting Amio at next OV. LFT/ TSH and no chronic disease on CXR in 10/2024. Willing to reconsider DCCV and afib clinic referral in the future after medication management.     Latest Ref Rng & Units 10/23/2024    4:23 AM 10/22/2024   11:36 AM 10/05/2021    1:46 PM  CBC  WBC 4.0 - 10.5 K/uL 7.5  10.3    Hemoglobin 12.0 - 15.0 g/dL 87.9  85.6  86.0   Hematocrit 36.0 - 46.0 % 37.3  44.2  41.0   Platelets 150 - 400 K/uL 402  454      Primary hypertension 10/2024 hospitalization: discontinued amlodipine  and chlorthalidone due to the need for diltiazem  as  above. Started Lasix  and continued on Olmesartan.  BP this OV well controlled today: 130/68 Continue on olmesartan 20 mg daily, Lasix  20 mg with KCL 20 MEQ BID. Order BMP.  Discontinued short acting Diltiazem  60 mg twice daily.  Ordered Cardizem  CD 180 mg. Follow up in 1 week for EKG and BP check.  If BP too soft or need more room for rate control then would consider reducing Olmesartan to 10 mg.  Encourage physical activity as tolerated and heart healthy low sodium diet. Discussed limiting sodium intake to < 2 grams daily.     Hyperlipidemia LDL goal <70 10/2024: LDL 75. LDL is mildly above goal. Encouraged low cholesterol meals.  Continue on Lipitor 20 mg daily  Bilateral carotid artery stenosis History of right-sided carotid endarterectomy Previously on ASA but discontinued due to the need for [facility] as above. Continue to follow-up with vascular surgery.    Dispo: Follow up for nurse visit in 1 week for EKG and BP check. Follow  up in 4 weeks for OV.   Signed, Lorette CINDERELLA Kapur, PA-C  "

## 2024-11-10 ENCOUNTER — Encounter: Payer: Self-pay | Admitting: Physician Assistant

## 2024-11-10 ENCOUNTER — Other Ambulatory Visit (HOSPITAL_COMMUNITY)
Admission: RE | Admit: 2024-11-10 | Discharge: 2024-11-10 | Disposition: A | Discharge status: Home / Self Care | Attending: Physician Assistant | Admitting: Physician Assistant

## 2024-11-10 ENCOUNTER — Ambulatory Visit: Attending: Physician Assistant | Admitting: Physician Assistant

## 2024-11-10 VITALS — BP 130/68 | HR 120 | Ht 62.0 in | Wt 117.0 lb

## 2024-11-10 DIAGNOSIS — I10 Essential (primary) hypertension: Secondary | ICD-10-CM

## 2024-11-10 DIAGNOSIS — I6523 Occlusion and stenosis of bilateral carotid arteries: Secondary | ICD-10-CM | POA: Diagnosis present

## 2024-11-10 DIAGNOSIS — I480 Paroxysmal atrial fibrillation: Secondary | ICD-10-CM | POA: Insufficient documentation

## 2024-11-10 DIAGNOSIS — E785 Hyperlipidemia, unspecified: Secondary | ICD-10-CM

## 2024-11-10 DIAGNOSIS — Z9889 Other specified postprocedural states: Secondary | ICD-10-CM

## 2024-11-10 LAB — BASIC METABOLIC PANEL WITH GFR
Anion gap: 17 — ABNORMAL HIGH (ref 5–15)
BUN: 14 mg/dL (ref 8–23)
CO2: 22 mmol/L (ref 22–32)
Calcium: 9.5 mg/dL (ref 8.9–10.3)
Chloride: 99 mmol/L (ref 98–111)
Creatinine, Ser: 0.6 mg/dL (ref 0.44–1.00)
GFR, Estimated: >60 mL/min
Glucose, Bld: 117 mg/dL — ABNORMAL HIGH (ref 70–99)
Potassium: 3.8 mmol/L (ref 3.5–5.1)
Sodium: 137 mmol/L (ref 135–145)

## 2024-11-10 LAB — CBC
HCT: 38.3 % (ref 36.0–46.0)
Hemoglobin: 12.4 g/dL (ref 12.0–15.0)
MCH: 30.1 pg (ref 26.0–34.0)
MCHC: 32.4 g/dL (ref 30.0–36.0)
MCV: 93 fL (ref 80.0–100.0)
Platelets: 443 K/uL — ABNORMAL HIGH (ref 150–400)
RBC: 4.12 MIL/uL (ref 3.87–5.11)
RDW: 14.8 % (ref 11.5–15.5)
WBC: 13.3 K/uL — ABNORMAL HIGH (ref 4.0–10.5)
nRBC: 0 % (ref 0.0–0.2)

## 2024-11-10 MED ORDER — DILTIAZEM HCL ER COATED BEADS 180 MG PO CP24: 180.0000 mg | ORAL_CAPSULE | Freq: Every day | ORAL | 11 refills | Status: AC

## 2024-11-10 NOTE — Patient Instructions (Signed)
 Medication Instructions:   STOP Diltiazem  60 mg twice a day  START Diltiazem  180 mg daily  Labwork: CBC,BMET today  Testing/Procedures: None today  Follow-Up: 1 week Nurse visit for EKG   4-6 weeks for Sheila Dunlap,PA-C  Any Other Special Instructions Will Be Listed Below (If Applicable).  If you need a refill on your cardiac medications before your next appointment, please call your pharmacy.

## 2024-11-11 ENCOUNTER — Ambulatory Visit: Payer: Self-pay | Admitting: Physician Assistant

## 2024-11-18 ENCOUNTER — Ambulatory Visit: Attending: Cardiology | Admitting: *Deleted

## 2024-11-18 VITALS — BP 110/66 | HR 127

## 2024-11-18 DIAGNOSIS — I4891 Unspecified atrial fibrillation: Secondary | ICD-10-CM | POA: Insufficient documentation

## 2024-11-18 MED ORDER — DILTIAZEM HCL ER COATED BEADS 240 MG PO CP24: 240.0000 mg | ORAL_CAPSULE | Freq: Every day | ORAL | 3 refills | Status: DC

## 2024-11-18 NOTE — Progress Notes (Unsigned)
 Pt in for EKG and BP check. Pt a resident at [facility] assisted living. According to [facility] pt is taking Diltiazem  60 mg BID. Son thinks that she is taking Diltiazem  180 mg Daily. Pt states that she feels fine.

## 2024-11-18 NOTE — Patient Instructions (Signed)
 Medication Instructions:  Increase Cardizem  CD to 240 mg Daily  Stop Taking Olmasartan   Labwork: NONE   Testing/Procedures: NONE   Follow-Up: 1 Week with nurse visit for EKG and Blood Pressure Check   Any Other Special Instructions Will Be Listed Below (If Applicable).     If you need a refill on your cardiac medications before your next appointment, please call your pharmacy.

## 2024-11-18 NOTE — Telephone Encounter (Signed)
 Pt and son notified at nurse visit.

## 2024-11-18 NOTE — Telephone Encounter (Signed)
-----   Message from Lorette CINDERELLA Kapur sent at 11/18/2024  4:41 PM EST ----- Confirmed with facility via phone call that patient is on Dilt ER 180 mg daily. Recommend increasing Dilt ER to 240 mg daily and discontinuing Olmesartan to allow more room for BP control. Follow up in nurse visit in 1 week for EKG and BP. Discussed with Dr. Alvan. Also per Dr. Alvan, if not able to control with rate control then can consider Amio. Will reassess based on follow up visit.

## 2024-11-27 ENCOUNTER — Ambulatory Visit: Attending: Cardiology

## 2024-11-27 VITALS — BP 124/60 | HR 105 | Ht 61.0 in | Wt 118.4 lb

## 2024-11-27 DIAGNOSIS — I10 Essential (primary) hypertension: Secondary | ICD-10-CM | POA: Diagnosis present

## 2024-11-27 DIAGNOSIS — I480 Paroxysmal atrial fibrillation: Secondary | ICD-10-CM | POA: Diagnosis present

## 2024-11-27 MED ORDER — DILTIAZEM HCL ER COATED BEADS 300 MG PO CP24: 300.0000 mg | ORAL_CAPSULE | Freq: Every day | ORAL | 3 refills | Status: AC

## 2024-11-27 NOTE — Addendum Note (Signed)
 Addended by: KENETH ROSINA BROCKS on: 11/27/2024 04:18 PM   Modules accepted: Orders

## 2024-11-28 NOTE — Progress Notes (Signed)
 Patient notified

## 2024-12-17 ENCOUNTER — Ambulatory Visit: Payer: Self-pay | Admitting: Physician Assistant

## 2024-12-17 ENCOUNTER — Other Ambulatory Visit (HOSPITAL_COMMUNITY)
Admission: RE | Admit: 2024-12-17 | Discharge: 2024-12-17 | Disposition: A | Source: Ambulatory Visit | Attending: Physician Assistant | Admitting: Physician Assistant

## 2024-12-17 ENCOUNTER — Ambulatory Visit: Admitting: Physician Assistant

## 2024-12-17 ENCOUNTER — Encounter: Payer: Self-pay | Admitting: Physician Assistant

## 2024-12-17 VITALS — BP 132/76 | HR 92 | Ht 64.0 in | Wt 124.2 lb

## 2024-12-17 DIAGNOSIS — R600 Localized edema: Secondary | ICD-10-CM

## 2024-12-17 DIAGNOSIS — R918 Other nonspecific abnormal finding of lung field: Secondary | ICD-10-CM

## 2024-12-17 DIAGNOSIS — E785 Hyperlipidemia, unspecified: Secondary | ICD-10-CM | POA: Diagnosis not present

## 2024-12-17 DIAGNOSIS — I480 Paroxysmal atrial fibrillation: Secondary | ICD-10-CM | POA: Diagnosis not present

## 2024-12-17 DIAGNOSIS — Z9889 Other specified postprocedural states: Secondary | ICD-10-CM

## 2024-12-17 DIAGNOSIS — I6523 Occlusion and stenosis of bilateral carotid arteries: Secondary | ICD-10-CM

## 2024-12-17 DIAGNOSIS — I10 Essential (primary) hypertension: Secondary | ICD-10-CM | POA: Diagnosis not present

## 2024-12-17 LAB — CBC
HCT: 40.1 % (ref 36.0–46.0)
Hemoglobin: 12.7 g/dL (ref 12.0–15.0)
MCH: 28.5 pg (ref 26.0–34.0)
MCHC: 31.7 g/dL (ref 30.0–36.0)
MCV: 89.9 fL (ref 80.0–100.0)
Platelets: 409 10*3/uL — ABNORMAL HIGH (ref 150–400)
RBC: 4.46 MIL/uL (ref 3.87–5.11)
RDW: 14.6 % (ref 11.5–15.5)
WBC: 10.8 10*3/uL — ABNORMAL HIGH (ref 4.0–10.5)
nRBC: 0 % (ref 0.0–0.2)

## 2024-12-17 LAB — D-DIMER, QUANTITATIVE: D-Dimer, Quant: 1.85 ug{FEU}/mL — ABNORMAL HIGH (ref 0.00–0.50)

## 2024-12-17 LAB — PRO BRAIN NATRIURETIC PEPTIDE: Pro Brain Natriuretic Peptide: 1807 pg/mL — ABNORMAL HIGH

## 2024-12-17 LAB — MAGNESIUM: Magnesium: 2 mg/dL (ref 1.7–2.4)

## 2024-12-17 NOTE — Patient Instructions (Addendum)
 Medication Instructions:  Your physician recommends that you continue on your current medications as directed. Please refer to the Current Medication list given to you today.  *If you need a refill on your cardiac medications before your next appointment, please call your pharmacy*  Lab Work: Complete labs including CBC, Mg, ProBNP, D-dimer  If you have labs (blood work) drawn today and your tests are completely normal, you will receive your results only by: MyChart Message (if you have MyChart) OR A paper copy in the mail If you have any lab test that is abnormal or we need to change your treatment, we will call you to review the results.  Testing/Procedures:  Complete Chest X ray   Follow-Up: At [facility], you and your health needs are our priority.  As part of our continuing mission to provide you with exceptional heart care, our providers are all part of one team.  This team includes your primary Cardiologist (physician) and Advanced Practice Providers or APPs (Physician Assistants and Nurse Practitioners) who all work together to provide you with the care you need, when you need it.  Your next appointment:   2-3 month(s)  Provider:   You may see Alvan Carrier, MD or one of the following Advanced Practice Providers on your designated Care Team:   Laymon Qua, PA-C  [place], [place] Olivia [place], [place]     We recommend signing up for the patient portal called MyChart.  Sign up information is provided on this After Visit Summary.  MyChart is used to connect with patients for Virtual Visits (Telemedicine).  Patients are able to view lab/test results, encounter notes, upcoming appointments, etc.  Non-urgent messages can be sent to your provider as well.   To learn more about what you can do with MyChart, go to [email or web address].   Other Instructions  Wear compression stockings.

## 2024-12-18 ENCOUNTER — Encounter: Payer: Self-pay | Admitting: Physician Assistant

## 2024-12-18 ENCOUNTER — Ambulatory Visit (HOSPITAL_COMMUNITY)
Admission: RE | Admit: 2024-12-18 | Discharge: 2024-12-18 | Disposition: A | Source: Ambulatory Visit | Attending: Physician Assistant

## 2024-12-18 DIAGNOSIS — I480 Paroxysmal atrial fibrillation: Secondary | ICD-10-CM

## 2024-12-18 DIAGNOSIS — R600 Localized edema: Secondary | ICD-10-CM

## 2024-12-18 DIAGNOSIS — R918 Other nonspecific abnormal finding of lung field: Secondary | ICD-10-CM

## 2024-12-18 NOTE — Telephone Encounter (Signed)
-----   Message from Lorette CINDERELLA Kapur sent at 12/17/2024  4:48 PM EST ----- Notify patients: Your blood work is overall reassuring. White blood cells and platelets are only mildly elevated and have improved compared to prior labs. Magnesium  is normal.   The D-dimer appears elevated, but when adjusted for age it is considered normal. Because of left leg swelling, we recommend an ultrasound of the left leg  to rule out a blood clot. A clot is less  likely since you are taking Eliquis , but this is recommended to be safe.  Order left LE venous US  (dx: elevated d-dimer and left leg edema)  Your proBNP (a marker of fluid buildup) is mildly elevated, suggesting some extra fluid. Please increase Lasix  to 40 mg daily for 3 days, then return to 20 mg daily, and repeat labs in 1 week.

## 2024-12-18 NOTE — Addendum Note (Signed)
 Addended by: KENETH ROSINA BROCKS on: 12/18/2024 08:17 AM   Modules accepted: Orders

## 2025-03-16 ENCOUNTER — Ambulatory Visit: Admitting: Physician Assistant
